# Patient Record
Sex: Male | Born: 1960 | Race: Black or African American | Hispanic: No | Marital: Married | Smoking: Never smoker
Health system: Southern US, Community
[De-identification: ages and names within clinical notes are randomized; demographics above are authoritative.]

## PROBLEM LIST (undated history)

## (undated) DIAGNOSIS — U071 COVID-19: Secondary | ICD-10-CM

## (undated) DIAGNOSIS — Z973 Presence of spectacles and contact lenses: Secondary | ICD-10-CM

## (undated) DIAGNOSIS — N201 Calculus of ureter: Secondary | ICD-10-CM

## (undated) DIAGNOSIS — Z8719 Personal history of other diseases of the digestive system: Secondary | ICD-10-CM

## (undated) DIAGNOSIS — K625 Hemorrhage of anus and rectum: Secondary | ICD-10-CM

## (undated) DIAGNOSIS — L509 Urticaria, unspecified: Secondary | ICD-10-CM

## (undated) DIAGNOSIS — N182 Chronic kidney disease, stage 2 (mild): Secondary | ICD-10-CM

## (undated) DIAGNOSIS — N281 Cyst of kidney, acquired: Secondary | ICD-10-CM

## (undated) DIAGNOSIS — R3915 Urgency of urination: Secondary | ICD-10-CM

## (undated) DIAGNOSIS — E291 Testicular hypofunction: Secondary | ICD-10-CM

## (undated) DIAGNOSIS — N5089 Other specified disorders of the male genital organs: Secondary | ICD-10-CM

## (undated) DIAGNOSIS — I2510 Atherosclerotic heart disease of native coronary artery without angina pectoris: Secondary | ICD-10-CM

## (undated) DIAGNOSIS — R0789 Other chest pain: Secondary | ICD-10-CM

## (undated) DIAGNOSIS — N200 Calculus of kidney: Secondary | ICD-10-CM

## (undated) DIAGNOSIS — E785 Hyperlipidemia, unspecified: Secondary | ICD-10-CM

## (undated) DIAGNOSIS — N2889 Other specified disorders of kidney and ureter: Secondary | ICD-10-CM

## (undated) DIAGNOSIS — D751 Secondary polycythemia: Secondary | ICD-10-CM

## (undated) DIAGNOSIS — Z87442 Personal history of urinary calculi: Secondary | ICD-10-CM

## (undated) DIAGNOSIS — I10 Essential (primary) hypertension: Secondary | ICD-10-CM

## (undated) HISTORY — PX: OTHER SURGICAL HISTORY: SHX169

## (undated) HISTORY — DX: Hemorrhage of anus and rectum: K62.5

## (undated) HISTORY — DX: Personal history of other diseases of the digestive system: Z87.19

## (undated) HISTORY — DX: Essential (primary) hypertension: I10

## (undated) HISTORY — DX: Secondary polycythemia: D75.1

## (undated) HISTORY — DX: Chronic kidney disease, stage 2 (mild): N18.2

## (undated) HISTORY — DX: Other specified disorders of kidney and ureter: N28.89

## (undated) HISTORY — DX: Testicular hypofunction: E29.1

## (undated) HISTORY — DX: Atherosclerotic heart disease of native coronary artery without angina pectoris: I25.10

## (undated) HISTORY — DX: Cyst of kidney, acquired: N28.1

## (undated) HISTORY — DX: Urticaria, unspecified: L50.9

## (undated) HISTORY — DX: Calculus of kidney: N20.0

## (undated) HISTORY — DX: Other chest pain: R07.89

## (undated) HISTORY — DX: Hyperlipidemia, unspecified: E78.5

---
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
## 1999-05-02 ENCOUNTER — Emergency Department (HOSPITAL_COMMUNITY): Admission: EM | Admit: 1999-05-02 | Discharge: 1999-05-03 | Payer: Self-pay | Admitting: Emergency Medicine

## 1999-05-03 ENCOUNTER — Encounter: Payer: Self-pay | Admitting: Emergency Medicine

## 1999-05-04 ENCOUNTER — Encounter: Payer: Self-pay | Admitting: Urology

## 1999-05-04 ENCOUNTER — Encounter: Admission: RE | Admit: 1999-05-04 | Discharge: 1999-05-04 | Payer: Self-pay | Admitting: Urology

## 2000-01-15 HISTORY — PX: KNEE SURGERY: SHX244

## 2003-11-15 DIAGNOSIS — N281 Cyst of kidney, acquired: Secondary | ICD-10-CM

## 2003-11-15 HISTORY — DX: Cyst of kidney, acquired: N28.1

## 2003-11-30 ENCOUNTER — Emergency Department (HOSPITAL_COMMUNITY): Admission: EM | Admit: 2003-11-30 | Discharge: 2003-11-30 | Payer: Self-pay | Admitting: Emergency Medicine

## 2006-07-14 ENCOUNTER — Emergency Department (HOSPITAL_COMMUNITY): Admission: EM | Admit: 2006-07-14 | Discharge: 2006-07-14 | Payer: Self-pay | Admitting: Emergency Medicine

## 2010-02-14 DIAGNOSIS — E291 Testicular hypofunction: Secondary | ICD-10-CM

## 2010-02-14 HISTORY — DX: Testicular hypofunction: E29.1

## 2010-05-09 ENCOUNTER — Encounter: Payer: Self-pay | Admitting: Family Medicine

## 2010-05-09 ENCOUNTER — Ambulatory Visit (INDEPENDENT_AMBULATORY_CARE_PROVIDER_SITE_OTHER): Payer: Self-pay | Admitting: Family Medicine

## 2010-05-09 ENCOUNTER — Other Ambulatory Visit: Payer: Self-pay | Admitting: Family Medicine

## 2010-05-09 DIAGNOSIS — E785 Hyperlipidemia, unspecified: Secondary | ICD-10-CM

## 2010-05-09 DIAGNOSIS — R3129 Other microscopic hematuria: Secondary | ICD-10-CM

## 2010-05-09 DIAGNOSIS — E291 Testicular hypofunction: Secondary | ICD-10-CM

## 2010-05-09 DIAGNOSIS — I10 Essential (primary) hypertension: Secondary | ICD-10-CM

## 2010-05-09 DIAGNOSIS — N529 Male erectile dysfunction, unspecified: Secondary | ICD-10-CM

## 2010-05-09 LAB — CBC WITH DIFFERENTIAL/PLATELET
Basophils Relative: 0.2 % (ref 0.0–3.0)
Eosinophils Relative: 2.7 % (ref 0.0–5.0)
HCT: 54.2 % — ABNORMAL HIGH (ref 39.0–52.0)
Hemoglobin: 18 g/dL — ABNORMAL HIGH (ref 13.0–17.0)
Lymphs Abs: 1.4 10*3/uL (ref 0.7–4.0)
Monocytes Relative: 6.7 % (ref 3.0–12.0)
Neutro Abs: 3.5 10*3/uL (ref 1.4–7.7)
Platelets: 172 10*3/uL (ref 150.0–400.0)
RBC: 5.96 Mil/uL — ABNORMAL HIGH (ref 4.22–5.81)
WBC: 5.5 10*3/uL (ref 4.5–10.5)

## 2010-05-09 LAB — TESTOSTERONE: Testosterone: 96.57 ng/dL — ABNORMAL LOW (ref 350.00–890.00)

## 2010-05-09 LAB — POCT URINALYSIS DIPSTICK
Ketones, UA: NEGATIVE
Leukocytes, UA: NEGATIVE

## 2010-05-09 LAB — COMPREHENSIVE METABOLIC PANEL
CO2: 29 mEq/L (ref 19–32)
GFR: 62.05 mL/min (ref >60.00)
Glucose, Bld: 75 mg/dL (ref 70–99)
Sodium: 142 mEq/L (ref 135–145)
Total Bilirubin: 0.5 mg/dL (ref 0.3–1.2)
Total Protein: 7.5 g/dL (ref 6.0–8.3)

## 2010-05-09 MED ORDER — AMLODIPINE BESYLATE 10 MG PO TABS: 10.0000 mg | ORAL_TABLET | Freq: Every day | ORAL | Status: DC

## 2010-05-09 NOTE — Assessment & Plan Note (Signed)
Restart med: I rx'd amlodipine 10mg  qd.  He stated that it took " a really low dose" of prior bp med (he can't recall name) to bring bp into normal range. I encouraged him to monitor bp daily until I see him for f/u in 1 wk. BP goal <130/80.  Will recheck Cr today.  He could have some mild renal insuff from his HTN, but given his large/muscular build this Cr level could still reflect normal renal fxn.

## 2010-05-09 NOTE — Progress Notes (Signed)
Office Note 05/09/2010  CC:  Chief Complaint  Patient presents with  . Establish Care    new patient    HPI:  Patrick Campbell is a 50 y.o. Black male who is here to establish care and discuss HTN, hypogonadism, recent mildly elevated Cr on his employers' labs. Patient's most recent primary MD: Dr. Tomma Rakers [phone]).  He saw him once a couple of months ago. Old records were reviewed prior to or during today's visit: his labs from 04/30/10 (normal TSH, PSA, lipids, HbA1c, and CMET except Cr 1.41 (compared to 1.35 per his report < 1 yr ago).  Hb 18.1. These labs were done after he was on testosterone gel for 1-2 mo --pt doesn't know name of product or his dosing, "it was a tube".  He had stopped the gel a few days prior to his labs.  He has not restarted it b/c the level as borderline high: 1033 ng/dl (uln is 6644). He felt MUCH improved on the testosterone--energy level back to normal. Has known dx of HTN for at least the last 1 yr, briefly took med and was controlled and then he stopped it b/c he doesn't like taking meds.  Feels worse when bp high (headaches).  He now realizes he needs to stay on certain meds chronically. He's been taking his statin for hypercholesterolemia regularly.  Past Medical History  Diagnosis Date  . Nephrolithiasis 04/1999 & 11/2003    w/right hydronephrosis on u/s 04/1999 & on CT 11/2003  . Acquired renal cyst of left kidney 11/2003    Complicated cyst vs solid mass: contrast enhanced CT was recommended but ?apparently not done?  . Cholelithiasis 11/2003    Noted on noncontrast CT abd.  No cholecystitis.  . Hypogonadism male 02/2010    Past Surgical History  Procedure Date  . Knee surgery 2002    Family History  Problem Relation Age of Onset  . Hypertension Mother   . Cancer Father     lung/ smoker  . Kidney disease Brother   . HIV Brother     History   Social History  . Marital Status: Married    Spouse Name: N/A    Number of  Children: N/A  . Years of Education: N/A   Occupational History  . Not on file.   Social History Main Topics  . Smoking status: Never Smoker   . Smokeless tobacco: Never Used  . Alcohol Use: Yes     2 glasses of wine 2 days a week  . Drug Use: No  . Sexually Active: Yes -- Male partner(s)   Other Topics Concern  . Not on file   Social History Narrative   Married, 3 children (15, 20, 21 yrs).  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  Was in [employer] x 10 yrs in [place], [place].Works out and plays sports regularly.  Rare wine intake.  No cigs or drugs.    Current outpatient prescriptions:atorvastatin (LIPITOR) 40 MG tablet, Take 40 mg by mouth daily.  , Disp: , Rfl: ;  NON FORMULARY, Testosterone gel 20% , Disp: , Rfl: ;  tadalafil (CIALIS) 10 MG tablet, Take 10 mg by mouth daily as needed.  , Disp: , Rfl: ;  amLODipine (NORVASC) 10 MG tablet, Take 1 tablet (10 mg total) by mouth daily., Disp: 30 tablet, Rfl: 0 NOTE: amlodipine was started at today's visit (it is not a med that he was taking prior to today).  No Known Allergies  ROS Review of  Systems  Constitutional: Negative for fever and fatigue.  HENT: Negative for congestion and sore throat.   Eyes: Negative for visual disturbance.  Respiratory: Negative for cough.   Cardiovascular: Negative for chest pain.  Gastrointestinal: Negative for nausea and abdominal pain.  Genitourinary: Negative for dysuria.  Musculoskeletal: Negative for back pain and joint swelling.  Skin: Negative for rash.  Neurological: Negative for weakness and headaches.  Hematological: Negative for adenopathy.    PE; Blood pressure 155/95, pulse 65, temperature 97.5 F (36.4 C), temperature source Oral, height 5\' 11"  (1.803 m), weight 219 lb 1.9 oz (99.392 kg), SpO2 98.00%. Gen: Alert, well appearing.  Patient is oriented to person, place, time, and situation. HEENT: Scalp without lesions or hair loss.  Ears: EACs clear, normal epithelium.   TMs with good light reflex and landmarks bilaterally.  Eyes: no injection, icteris, swelling, or exudate.  EOMI, PERRLA. Nose: no drainage or turbinate edema/swelling.  No injection or focal lesion.  Mouth: lips without lesion/swelling.  Oral mucosa pink and moist.  Dentition intact and without obvious caries or gingival swelling.  Oropharynx without erythema, exudate, or swelling.  Neck: supple, ROM full.  Carotids 2+ bilat, without bruit.  No lymphadenopathy, thyromegaly, or mass. Chest: symmetric expansion, nonlabored respirations.  Clear and equal breath sounds in all lung fields.   CV: RRR, no m/r/g.  Peripheral pulses 2+ and symmetric. ABD: soft, NT, ND, BS normal.  No hepatospenomegaly or mass.  No bruits. EXT: no clubbing, cyanosis, or edema.   LABS: CC UA today: small hemolyzed blood, otherwise normal. 12 lead EKG today: sinus brady, rate 54, normal wave morphologies, intervals, voltages, and axis.  ASSESSMENT AND PLAN:   HTN (hypertension), benign Restart med: I rx'd amlodipine 10mg  qd.  He stated that it took " a really low dose" of prior bp med (he can't recall name) to bring bp into normal range. I encouraged him to monitor bp daily until I see him for f/u in 1 wk. BP goal <130/80.  Will recheck Cr today.  He could have some mild renal insuff from his HTN, but given his large/muscular build this Cr level could still reflect normal renal fxn.   Hypogonadism male Recheck testost level today since off med x about 2 wks.  Need old records/original low test test.  Will do TSH and prolactin today as well. Will likely restart testost gel in near future.     Return in about 7 days (around 05/16/2010).

## 2010-05-09 NOTE — Assessment & Plan Note (Signed)
Recheck testost level today since off med x about 2 wks.  Need old records/original low test test.  Will do TSH and prolactin today as well. Will likely restart testost gel in near future.

## 2010-05-09 NOTE — Patient Instructions (Signed)
Sign release of information authorization sheet when you check out (need old records from Dr. Tomma Rakers [id number]). Check bp at home once daily with an UPPER ARM bp cuff and write them down and bring in with you at next follow up in 7-10 days.

## 2010-05-10 ENCOUNTER — Encounter: Payer: Self-pay | Admitting: Family Medicine

## 2010-05-10 DIAGNOSIS — D751 Secondary polycythemia: Secondary | ICD-10-CM

## 2010-05-10 HISTORY — DX: Secondary polycythemia: D75.1

## 2010-05-10 LAB — FSH/LH
FSH: 0.7 m[IU]/mL — ABNORMAL LOW (ref 1.4–18.1)
LH: 0.4 m[IU]/mL — ABNORMAL LOW (ref 1.5–9.3)

## 2010-05-11 LAB — ERYTHROPOIETIN: Erythropoietin: 5.5 m[IU]/mL (ref 2.6–34.0)

## 2010-05-15 ENCOUNTER — Other Ambulatory Visit: Payer: Self-pay | Admitting: Family Medicine

## 2010-05-15 DIAGNOSIS — D751 Secondary polycythemia: Secondary | ICD-10-CM

## 2010-05-16 ENCOUNTER — Other Ambulatory Visit (INDEPENDENT_AMBULATORY_CARE_PROVIDER_SITE_OTHER): Payer: PRIVATE HEALTH INSURANCE

## 2010-05-16 DIAGNOSIS — D751 Secondary polycythemia: Secondary | ICD-10-CM

## 2010-05-17 ENCOUNTER — Encounter: Payer: Self-pay | Admitting: Family Medicine

## 2010-05-21 ENCOUNTER — Telehealth: Payer: Self-pay | Admitting: Family Medicine

## 2010-05-21 ENCOUNTER — Ambulatory Visit: Payer: PRIVATE HEALTH INSURANCE | Admitting: Family Medicine

## 2010-05-21 NOTE — Telephone Encounter (Signed)
Pls call patient and let him know that his recent iron tests were all normal.   Please have him reschedule the f/u appt that he missed today so we can recheck bp issue and discuss the next step regarding his low testosterone level.

## 2010-05-23 NOTE — Telephone Encounter (Signed)
I have attempted to contact this patient by phone with the following results: left message to return my call on answering machine.

## 2010-05-25 NOTE — Telephone Encounter (Signed)
I have attempted to contact this patient by phone with the following results: left message to return my call on answering machine (home/cell).

## 2010-05-28 NOTE — Telephone Encounter (Signed)
Spoke to pt. Advised iron labs normal.  Pt rescheduled appt for 06/06/10 @ 8:30am.  Advised pt we would discuss labs and recheck BP at this visit.

## 2010-06-06 ENCOUNTER — Ambulatory Visit: Payer: PRIVATE HEALTH INSURANCE | Admitting: Family Medicine

## 2010-06-12 ENCOUNTER — Encounter: Payer: Self-pay | Admitting: Family Medicine

## 2010-06-12 ENCOUNTER — Ambulatory Visit (INDEPENDENT_AMBULATORY_CARE_PROVIDER_SITE_OTHER): Payer: PRIVATE HEALTH INSURANCE | Admitting: Family Medicine

## 2010-06-12 VITALS — BP 149/90 | HR 73 | Ht 71.0 in | Wt 212.0 lb

## 2010-06-12 DIAGNOSIS — I10 Essential (primary) hypertension: Secondary | ICD-10-CM

## 2010-06-12 DIAGNOSIS — R51 Headache: Secondary | ICD-10-CM

## 2010-06-12 DIAGNOSIS — N189 Chronic kidney disease, unspecified: Secondary | ICD-10-CM | POA: Insufficient documentation

## 2010-06-12 DIAGNOSIS — G8929 Other chronic pain: Secondary | ICD-10-CM

## 2010-06-12 DIAGNOSIS — H538 Other visual disturbances: Secondary | ICD-10-CM

## 2010-06-12 DIAGNOSIS — D751 Secondary polycythemia: Secondary | ICD-10-CM

## 2010-06-12 DIAGNOSIS — E291 Testicular hypofunction: Secondary | ICD-10-CM

## 2010-06-12 MED ORDER — TESTOSTERONE 25 MG/2.5GM (1%) TD GEL: 5.0000 g | Freq: Every day | TRANSDERMAL | Status: DC

## 2010-06-12 NOTE — Progress Notes (Signed)
OFFICE VISIT  06/12/2010   CC:  Chief Complaint  Patient presents with  . Hypertension    only taking 1/2 tab amlodipine     HPI:    Patient is a 50 y.o. African-American male who presents for f/u hypogonadism, mild CRI, HTN. Taking 1/2 the amolidipine tab instead of whole: no clear reason why.  Not monitoring bp at home. We reviewed all lab work done about 75mo ago and discussed the possible next step, including possible MRI brain, repeat LH/FSH to see if recent low levels simply reflected a delayed rebound from suppression when he was on testosterone therapy, and possible referral to endocrinologist. Then he revealed that he has been having almost daily frontal/temporal headaches with vague blurry vision over about the last year or so.  No visual field loss, no unilateral symptoms.  No nausea, aura, or photophobia w/HAs.  Aspirin helps the HAs.   Past Medical History  Diagnosis Date  . Nephrolithiasis 04/1999 & 11/2003    w/right hydronephrosis on u/s 04/1999 & on CT 11/2003  . Acquired renal cyst of left kidney 11/2003    Complicated cyst vs solid mass: contrast enhanced CT was recommended but ?apparently not done?  . Cholelithiasis 11/2003    Noted on noncontrast CT abd.  No cholecystitis.  . Hypogonadism male 02/2010    Past Surgical History  Procedure Date  . Knee surgery 2002    Outpatient Prescriptions Prior to Visit  Medication Sig Dispense Refill  . amLODipine (NORVASC) 10 MG tablet Take 1 tablet (10 mg total) by mouth daily.  30 tablet  0  . atorvastatin (LIPITOR) 40 MG tablet Take 40 mg by mouth daily.        . tadalafil (CIALIS) 10 MG tablet Take 10 mg by mouth daily as needed.        . NON FORMULARY Testosterone gel 20%         No Known Allergies  ROS As per HPI.    PE: Blood pressure 149/90, pulse 73, height 5\' 11"  (1.803 m), weight 212 lb (96.163 kg). Gen: Alert, well appearing.  Patient is oriented to person, place, time, and situation. No further exam  today.  LABS:  None today  IMPRESSION AND PLAN:  HTN (hypertension), benign Not ideally controlled. Encouraged compliance with 1 full tab of amlodipine 10mg  daily. Encouraged occasional home monitoring.  Hypogonadism male Lab Results  Component Value Date   TESTOSTERONE 96.57* 05/09/2010   Restart testost: Androgel 5g packet, one qd topically. Will order brain MRI w and w/o contrast with fine cuts in pituitary region to further assess for pituitary adenoma (non-secreting type) being possible cause of his hypogonadism (FSH and LH low, chronic HAs and blurry vision intermittently --coincident with dx of hypogonadism).   Polycythemia Idiopathic. His epo level is normal, and his iron levels are normal.   Hb has been stable.  Chronic headaches Likely chronic tension HA's, but with hypogonadism and blurry vision will r/o pituitary adenoma as etiology.  Chronic renal insufficiency Lab Results  Component Value Date   CREATININE 1.5 05/09/2010  This is stable compared to old records and is only a recent dx (2011). Assumed secondary to chronic uncontrolled htn. Will do 24h urine to get more accurate estimation of CrCl/GFR.      FOLLOW UP: Return in about 2 months (around 08/12/2010) for f/u hypogonadism, HA's, HTN.

## 2010-06-12 NOTE — Assessment & Plan Note (Signed)
Likely chronic tension HA's, but with hypogonadism and blurry vision will r/o pituitary adenoma as etiology.

## 2010-06-12 NOTE — Assessment & Plan Note (Signed)
Lab Results  Component Value Date   CREATININE 1.5 05/09/2010  This is stable compared to old records and is only a recent dx (2011). Assumed secondary to chronic uncontrolled htn. Will do 24h urine to get more accurate estimation of CrCl/GFR.

## 2010-06-12 NOTE — Assessment & Plan Note (Signed)
Not ideally controlled. Encouraged compliance with 1 full tab of amlodipine 10mg  daily. Encouraged occasional home monitoring.

## 2010-06-12 NOTE — Assessment & Plan Note (Signed)
Idiopathic. His epo level is normal, and his iron levels are normal.   Hb has been stable.

## 2010-06-12 NOTE — Assessment & Plan Note (Signed)
Lab Results  Component Value Date   TESTOSTERONE 96.57* 05/09/2010   Restart testost: Androgel 5g packet, one qd topically. Will order brain MRI w and w/o contrast with fine cuts in pituitary region to further assess for pituitary adenoma (non-secreting type) being possible cause of his hypogonadism (FSH and LH low, chronic HAs and blurry vision intermittently --coincident with dx of hypogonadism).

## 2010-06-15 ENCOUNTER — Other Ambulatory Visit: Payer: Self-pay

## 2010-06-15 MED ORDER — AMLODIPINE BESYLATE 10 MG PO TABS: 10.0000 mg | ORAL_TABLET | Freq: Every day | ORAL | Status: DC

## 2010-06-28 ENCOUNTER — Telehealth: Payer: Self-pay | Admitting: Family Medicine

## 2010-06-28 NOTE — Telephone Encounter (Signed)
Patient put in a request with the pharmacy yesterday, did we receive the request? Patient is going out of town at noon today, please contact patient's wife

## 2010-06-28 NOTE — Telephone Encounter (Signed)
RX was sent to pharm on 06/27/10.  Pt spoke with Erie Noe and patient will check with pharmacy to see if they have RX.

## 2010-10-11 ENCOUNTER — Other Ambulatory Visit: Payer: Self-pay

## 2010-10-11 MED ORDER — AMLODIPINE BESYLATE 10 MG PO TABS: 10.0000 mg | ORAL_TABLET | Freq: Every day | ORAL | Status: DC

## 2010-10-31 LAB — POCT I-STAT CREATININE
Creatinine, Ser: 1.3
Operator id: 151321

## 2010-10-31 LAB — I-STAT 8, (EC8 V) (CONVERTED LAB)
Acid-base deficit: 2
BUN: 16
Bicarbonate: 25.3 — ABNORMAL HIGH
Chloride: 108
Glucose, Bld: 97
HCT: 45
Hemoglobin: 15.3
Operator id: 151321
Potassium: 4
Sodium: 139
TCO2: 27
pCO2, Ven: 49.3
pH, Ven: 7.318 — ABNORMAL HIGH

## 2010-10-31 LAB — POCT CARDIAC MARKERS
CKMB, poc: 1.4
Myoglobin, poc: 117
Operator id: 151321
Troponin i, poc: <0.05

## 2011-07-09 ENCOUNTER — Ambulatory Visit: Payer: PRIVATE HEALTH INSURANCE | Admitting: Family Medicine

## 2011-07-15 ENCOUNTER — Encounter: Payer: Self-pay | Admitting: Family Medicine

## 2011-07-15 ENCOUNTER — Ambulatory Visit (INDEPENDENT_AMBULATORY_CARE_PROVIDER_SITE_OTHER): Payer: PRIVATE HEALTH INSURANCE | Admitting: Family Medicine

## 2011-07-15 ENCOUNTER — Encounter: Payer: Self-pay | Admitting: Internal Medicine

## 2011-07-15 VITALS — BP 159/100 | HR 71 | Temp 97.1°F | Ht 71.0 in | Wt 216.0 lb

## 2011-07-15 DIAGNOSIS — R51 Headache: Secondary | ICD-10-CM

## 2011-07-15 DIAGNOSIS — E785 Hyperlipidemia, unspecified: Secondary | ICD-10-CM

## 2011-07-15 DIAGNOSIS — I10 Essential (primary) hypertension: Secondary | ICD-10-CM

## 2011-07-15 DIAGNOSIS — Z1211 Encounter for screening for malignant neoplasm of colon: Secondary | ICD-10-CM

## 2011-07-15 DIAGNOSIS — D45 Polycythemia vera: Secondary | ICD-10-CM

## 2011-07-15 DIAGNOSIS — D751 Secondary polycythemia: Secondary | ICD-10-CM

## 2011-07-15 DIAGNOSIS — H538 Other visual disturbances: Secondary | ICD-10-CM

## 2011-07-15 DIAGNOSIS — E291 Testicular hypofunction: Secondary | ICD-10-CM

## 2011-07-15 DIAGNOSIS — Z0000 Encounter for general adult medical examination without abnormal findings: Secondary | ICD-10-CM | POA: Insufficient documentation

## 2011-07-15 DIAGNOSIS — Z125 Encounter for screening for malignant neoplasm of prostate: Secondary | ICD-10-CM

## 2011-07-15 DIAGNOSIS — G8929 Other chronic pain: Secondary | ICD-10-CM

## 2011-07-15 MED ORDER — AMLODIPINE BESYLATE 10 MG PO TABS: 10.0000 mg | ORAL_TABLET | Freq: Every day | ORAL | Status: DC

## 2011-07-15 MED ORDER — TESTOSTERONE 10 MG/ACT (2%) TD GEL: 40.0000 mg | Freq: Every day | TRANSDERMAL | Status: DC

## 2011-07-15 MED ORDER — ATORVASTATIN CALCIUM 40 MG PO TABS: 40.0000 mg | ORAL_TABLET | Freq: Every day | ORAL | Status: DC

## 2011-07-15 NOTE — Assessment & Plan Note (Signed)
Recheck this ASAP when he comes in for fasting labs. Azucena Freed was listed as preferred according to the insurance info in our EMR, so I changed him to this med today, 2 quirts of gel on each thigh once daily. We'll recheck level at f/u in 1 mo.

## 2011-07-15 NOTE — Assessment & Plan Note (Signed)
Poor control, medication noncompliance. RF'd med. Check lytes/cr.

## 2011-07-15 NOTE — Assessment & Plan Note (Signed)
Check FLP ASAP--he'll return for fasting lab visit--orders placed.

## 2011-07-15 NOTE — Assessment & Plan Note (Addendum)
Reviewed age and gender appropriate health maintenance issues (prudent diet, regular exercise, health risks of tobacco and excessive alcohol, use of seatbelts, fire alarms in home, use of sunscreen).  Also reviewed age and gender appropriate health screening as well as vaccine recommendations. PSA screening for prostate cancer ordered--pt to return for labs when fasting). Referral to GI for colon cancer screening ordered today. Need to give him Tdap booster at f/u in 1 month.

## 2011-07-15 NOTE — Assessment & Plan Note (Signed)
EPO level in the past normal. Will follow CBC.

## 2011-07-15 NOTE — Assessment & Plan Note (Signed)
These have improved, are more likely tension HA's + poorly controlled HTN. He describes vision "blurry" but this goes away with corrective lenses.   However, with hx of very low testosterone, low LH and FSH, and hx of these HAs and vision complaints, I do think it is appropriate to do brain MRI w and w/o contrast to further evaluate pituitary gland.

## 2011-07-15 NOTE — Progress Notes (Signed)
Office Note 07/15/2011  CC:  Chief Complaint  Patient presents with  . Annual Exam    refill BP meds, 90 day    HPI:  Patrick Campbell is a 51 y.o. Black male who is here for CPE. I last saw him over a year ago.  For some reason, his MRI brain that we ordered to look at his pituitary gland was cancelled.  He says he never got a call about it. He reports he has been out of his BP med for "a while"--months. Also not on testosterone gel for 6 mo or so, says he thinks he was feeling better on it but then questions this. He reiterates the stress of his job, feels "the weight of the world" on his shoulders.   Exercises a couple of times per week, otherwise no stress outlet. Says HA's have not been as big a problem lately as they were last f/u visit. Still has "blurry vision" but then says this is corrected when wearing corrective lenses. His androgel was "discontinued" per a letter he received around 6 mo ago, unclear info on this.   Past Medical History  Diagnosis Date  . Nephrolithiasis 04/1999 & 11/2003    w/right hydronephrosis on u/s 04/1999 & on CT 11/2003  . Acquired renal cyst of left kidney 11/2003    Complicated cyst vs solid mass: contrast enhanced CT was recommended but ?apparently not done?  . Cholelithiasis 11/2003    Noted on noncontrast CT abd.  No cholecystitis.  . Hypogonadism male 02/2010  . Hypertension   . Chronic renal insufficiency, stage II (mild)     Borderline stage II/III (CrCl estimate @ low 60s)  . Polycythemia 05/10/2010    EPO level normal    Past Surgical History  Procedure Date  . Knee surgery 2002    Family History  Problem Relation Age of Onset  . Hypertension Mother   . Cancer Father     lung/ smoker  . Kidney disease Brother   . HIV Brother     History   Social History  . Marital Status: Married    Spouse Name: N/A    Number of Children: N/A  . Years of Education: N/A   Occupational History  . Not on file.   Social History  Main Topics  . Smoking status: Never Smoker   . Smokeless tobacco: Never Used  . Alcohol Use: Yes     2 glasses of wine 2 days a week  . Drug Use: No  . Sexually Active: Yes -- Male partner(s)   Other Topics Concern  . Not on file   Social History Narrative   Married, 3 children (15, 20, 21 yrs).  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  Rare wine intake.  No cigs or drugs.    Outpatient Prescriptions Prior to Visit  Medication Sig Dispense Refill  . atorvastatin (LIPITOR) 40 MG tablet Take 40 mg by mouth daily.        Marland Kitchen amLODipine (NORVASC) 10 MG tablet Take 1 tablet (10 mg total) by mouth daily.  30 tablet  2  . tadalafil (CIALIS) 10 MG tablet Take 10 mg by mouth daily as needed.        . Testosterone (ANDROGEL) 25 MG/2.5GM GEL Place 5 g onto the skin daily.  2.5 g  5    No Known Allergies  ROS Review of Systems  Constitutional:  Negative for fever, chills, appetite change and fatigue.  HENT: Negative for ear pain, congestion, sore throat, neck stiffness and dental problem.   Eyes: Negative for discharge, redness and visual disturbance.  Respiratory: Negative for cough, chest tightness, shortness of breath and wheezing.   Cardiovascular: Negative for chest pain, palpitations and leg swelling.  Gastrointestinal: Negative for nausea, vomiting, abdominal pain, diarrhea and blood in stool.  Genitourinary: Negative for dysuria, urgency, frequency, hematuria, flank pain and difficulty urinating.  Musculoskeletal: Negative for myalgias, back pain, joint swelling and arthralgias.  Skin: Negative for pallor and rash.  Neurological: Positive for headaches. Negative for dizziness, speech difficulty and weakness.  Hematological: Negative for adenopathy. Does not bruise/bleed easily.  Psychiatric/Behavioral: Negative for confusion and disturbed wake/sleep cycle. The patient is not nervous/anxious.      PE; Blood pressure 159/100, pulse 71, temperature 97.1 F (36.2 C), temperature source Temporal, height 5\' 11"  (1.803 m), weight 216 lb (97.977 kg). Gen: Alert, well appearing AA male.  Patient is oriented to person, place, time, and situation. Affect is pleasant, thought and speech lucid. ENT: Ears: EACs clear, normal epithelium.  TMs with good light reflex and landmarks bilaterally.  Eyes: no injection, icteris, swelling, or exudate.  EOMI, PERRLA. Nose: no drainage or turbinate edema/swelling.  No injection or focal lesion.  Mouth: lips without lesion/swelling.  Oral mucosa pink and moist.  Dentition intact and without obvious caries or gingival swelling.  Oropharynx without erythema, exudate, or swelling.  Neck: supple/nontender.  No LAD, mass, or TM.  Carotid pulses 2+ bilaterally, without bruits. CV: RRR, no m/r/g.   LUNGS: CTA bilat, nonlabored resps, good aeration in all lung fields. ABD: soft, NT, ND, BS normal.  No hepatospenomegaly or mass.  No bruits. EXT: no clubbing, cyanosis, or edema.  Genitals normal; both testes normal without tenderness, masses, hydroceles, varicoceles, erythema or swelling. Shaft normal, circumcised, meatus normal without discharge. No inguinal hernia noted. No inguinal lymphadenopathy. Rectal exam: negative without mass, lesions or tenderness, PROSTATE EXAM: smooth and symmetric without nodules or tenderness.   Pertinent labs:  None today  ASSESSMENT AND PLAN:   Health maintenance examination Reviewed age and gender appropriate health maintenance issues (prudent diet, regular exercise, health risks of tobacco and excessive alcohol, use of seatbelts, fire alarms in home, use of sunscreen).  Also reviewed age and gender appropriate health screening as well as vaccine recommendations. PSA screening for prostate cancer ordered--pt to return for labs when fasting). Referral to GI for colon cancer screening ordered today. Need to give him Tdap booster at  f/u in 1 month.   HTN (hypertension), benign Poor control, medication noncompliance. RF'd med. Check lytes/cr.  Hyperlipidemia Check FLP ASAP--he'll return for fasting lab visit--orders placed.  Polycythemia EPO level in the past normal. Will follow CBC.  Chronic headaches These have improved, are more likely tension HA's + poorly controlled HTN. He describes vision "blurry" but this goes away with corrective lenses.   However, with hx of very low testosterone, low LH and FSH, and hx of these HAs and vision complaints, I do think it is appropriate to do brain MRI w and w/o contrast to further evaluate pituitary gland.  Hypogonadism male Recheck this ASAP when he comes in for fasting labs. Azucena Freed was listed as preferred according to the insurance info in our EMR, so I changed him to this med today, 2 quirts of gel on each thigh once daily. We'll recheck level at f/u in 1 mo.    FOLLOW UP:  Return in about 1 month (around 08/15/2011) for f/u HTN and hypogonadism.

## 2011-07-16 ENCOUNTER — Other Ambulatory Visit: Payer: PRIVATE HEALTH INSURANCE

## 2011-07-20 ENCOUNTER — Ambulatory Visit (HOSPITAL_BASED_OUTPATIENT_CLINIC_OR_DEPARTMENT_OTHER): Payer: PRIVATE HEALTH INSURANCE

## 2011-07-22 ENCOUNTER — Other Ambulatory Visit (INDEPENDENT_AMBULATORY_CARE_PROVIDER_SITE_OTHER): Payer: PRIVATE HEALTH INSURANCE

## 2011-07-22 DIAGNOSIS — Z1211 Encounter for screening for malignant neoplasm of colon: Secondary | ICD-10-CM

## 2011-07-22 DIAGNOSIS — D751 Secondary polycythemia: Secondary | ICD-10-CM

## 2011-07-22 DIAGNOSIS — E785 Hyperlipidemia, unspecified: Secondary | ICD-10-CM

## 2011-07-22 DIAGNOSIS — Z125 Encounter for screening for malignant neoplasm of prostate: Secondary | ICD-10-CM

## 2011-07-22 DIAGNOSIS — H538 Other visual disturbances: Secondary | ICD-10-CM

## 2011-07-22 DIAGNOSIS — E291 Testicular hypofunction: Secondary | ICD-10-CM

## 2011-07-22 DIAGNOSIS — R51 Headache: Secondary | ICD-10-CM

## 2011-07-22 DIAGNOSIS — D45 Polycythemia vera: Secondary | ICD-10-CM

## 2011-07-22 DIAGNOSIS — I10 Essential (primary) hypertension: Secondary | ICD-10-CM

## 2011-07-22 DIAGNOSIS — Z0000 Encounter for general adult medical examination without abnormal findings: Secondary | ICD-10-CM

## 2011-07-22 LAB — LUTEINIZING HORMONE: LH: 3.28 m[IU]/mL (ref 1.50–9.30)

## 2011-07-22 LAB — COMPREHENSIVE METABOLIC PANEL
ALT: 29 U/L (ref 0–53)
AST: 27 U/L (ref 0–37)
Albumin: 4.1 g/dL (ref 3.5–5.2)
Alkaline Phosphatase: 45 U/L (ref 39–117)
Calcium: 9.2 mg/dL (ref 8.4–10.5)
Chloride: 106 mEq/L (ref 96–112)
Creatinine, Ser: 1.4 mg/dL (ref 0.4–1.5)
Potassium: 4.1 mEq/L (ref 3.5–5.1)

## 2011-07-22 LAB — CBC WITH DIFFERENTIAL/PLATELET
Basophils Relative: 0.1 % (ref 0.0–3.0)
Eosinophils Absolute: 0.3 10*3/uL (ref 0.0–0.7)
HCT: 47.5 % (ref 39.0–52.0)
Hemoglobin: 15.6 g/dL (ref 13.0–17.0)
MCHC: 32.7 g/dL (ref 30.0–36.0)
MCV: 91.9 fl (ref 78.0–100.0)
Monocytes Absolute: 0.5 10*3/uL (ref 0.1–1.0)
Neutro Abs: 3.7 10*3/uL (ref 1.4–7.7)
RBC: 5.17 Mil/uL (ref 4.22–5.81)

## 2011-07-22 LAB — LIPID PANEL
LDL Cholesterol: 111 mg/dL — ABNORMAL HIGH (ref 0–99)
Total CHOL/HDL Ratio: 4

## 2011-07-22 LAB — PROLACTIN: Prolactin: 6.6 ng/mL (ref 2.1–17.1)

## 2011-07-27 ENCOUNTER — Ambulatory Visit (HOSPITAL_BASED_OUTPATIENT_CLINIC_OR_DEPARTMENT_OTHER)
Admission: RE | Admit: 2011-07-27 | Discharge: 2011-07-27 | Disposition: A | Discharge status: Home / Self Care | Payer: PRIVATE HEALTH INSURANCE | Source: Ambulatory Visit | Attending: Family Medicine | Admitting: Family Medicine

## 2011-07-27 DIAGNOSIS — R51 Headache: Secondary | ICD-10-CM

## 2011-07-27 DIAGNOSIS — H538 Other visual disturbances: Secondary | ICD-10-CM

## 2011-07-27 DIAGNOSIS — E291 Testicular hypofunction: Secondary | ICD-10-CM | POA: Insufficient documentation

## 2011-07-27 MED ORDER — GADOBENATE DIMEGLUMINE 529 MG/ML IV SOLN
10.0000 mL | Freq: Once | INTRAVENOUS | Status: AC | PRN
  Administered 2011-07-27: 10 mL via INTRAVENOUS

## 2011-08-19 ENCOUNTER — Encounter: Payer: PRIVATE HEALTH INSURANCE | Admitting: Family Medicine

## 2011-08-19 DIAGNOSIS — Z0289 Encounter for other administrative examinations: Secondary | ICD-10-CM

## 2011-08-23 ENCOUNTER — Encounter: Payer: Self-pay | Admitting: *Deleted

## 2011-08-29 ENCOUNTER — Telehealth: Payer: Self-pay

## 2011-08-29 NOTE — Telephone Encounter (Signed)
Yes patient was back on the testosterone when the labs were drawn.

## 2011-08-29 NOTE — Telephone Encounter (Signed)
Patient left a message that he was returning Stephanie's call on his blood work?  I called pt and left a message for him to return my call.

## 2011-09-03 ENCOUNTER — Encounter: Payer: PRIVATE HEALTH INSURANCE | Admitting: Internal Medicine

## 2011-09-17 ENCOUNTER — Encounter: Payer: Self-pay | Admitting: Internal Medicine

## 2011-09-17 ENCOUNTER — Ambulatory Visit (AMBULATORY_SURGERY_CENTER): Payer: PRIVATE HEALTH INSURANCE

## 2011-09-17 VITALS — Ht 71.0 in | Wt 217.1 lb

## 2011-09-17 DIAGNOSIS — Z1211 Encounter for screening for malignant neoplasm of colon: Secondary | ICD-10-CM

## 2011-09-17 DIAGNOSIS — Z8371 Family history of colonic polyps: Secondary | ICD-10-CM

## 2011-09-17 MED ORDER — NA SULFATE-K SULFATE-MG SULF 17.5-3.13-1.6 GM/177ML PO SOLN: 1.0000 | Freq: Once | ORAL | Status: DC

## 2011-09-30 ENCOUNTER — Encounter: Payer: Self-pay | Admitting: Internal Medicine

## 2011-09-30 ENCOUNTER — Ambulatory Visit (AMBULATORY_SURGERY_CENTER): Payer: PRIVATE HEALTH INSURANCE | Admitting: Internal Medicine

## 2011-09-30 VITALS — BP 139/91 | HR 93 | Temp 97.5°F | Resp 16 | Ht 71.0 in | Wt 217.0 lb

## 2011-09-30 DIAGNOSIS — Z1211 Encounter for screening for malignant neoplasm of colon: Secondary | ICD-10-CM

## 2011-09-30 DIAGNOSIS — Z8371 Family history of colonic polyps: Secondary | ICD-10-CM

## 2011-09-30 HISTORY — PX: COLONOSCOPY: SHX174

## 2011-09-30 MED ORDER — SODIUM CHLORIDE 0.9 % IV SOLN: 500.0000 mL | INTRAVENOUS | Status: DC

## 2011-09-30 NOTE — Progress Notes (Signed)
Patient did not experience any of the following events: a burn prior to discharge; a fall within the facility; wrong site/side/patient/procedure/implant event; or a hospital transfer or hospital admission upon discharge from the facility. (G8907) Patient did not have preoperative order for IV antibiotic SSI prophylaxis. (G8918)  

## 2011-09-30 NOTE — Progress Notes (Signed)
Propofol given and oxygen managed per [facility] CRNA  When propofol initially given, sats dropped to 83%  O2 up to 6/liters, chin lifted.  Sats remain at 96% now

## 2011-09-30 NOTE — Patient Instructions (Signed)
Normal colonoscopy today. Repeat colonoscopy in 10 years. Call us with any questions or concerns. Thank you!!  YOU HAD AN ENDOSCOPIC PROCEDURE TODAY AT THE [facility]: Refer to the procedure report that was given to you for any specific questions about what was found during the examination.  If the procedure report does not answer your questions, please call your gastroenterologist to clarify.  If you requested that your care partner not be given the details of your procedure findings, then the procedure report has been included in a sealed envelope for you to review at your convenience later.  YOU SHOULD EXPECT: Some feelings of bloating in the abdomen. Passage of more gas than usual.  Walking can help get rid of the air that was put into your GI tract during the procedure and reduce the bloating. If you had a lower endoscopy (such as a colonoscopy or flexible sigmoidoscopy) you may notice spotting of blood in your stool or on the toilet paper. If you underwent a bowel prep for your procedure, then you may not have a normal bowel movement for a few days.  DIET: Your first meal following the procedure should be a light meal and then it is ok to progress to your normal diet.  A half-sandwich or bowl of soup is an example of a good first meal.  Heavy or fried foods are harder to digest and may make you feel nauseous or bloated.  Likewise meals heavy in dairy and vegetables can cause extra gas to form and this can also increase the bloating.  Drink plenty of fluids but you should avoid alcoholic beverages for 24 hours.  ACTIVITY: Your care partner should take you home directly after the procedure.  You should plan to take it easy, moving slowly for the rest of the day.  You can resume normal activity the day after the procedure however you should NOT DRIVE or use heavy machinery for 24 hours (because of the sedation medicines used during the test).    SYMPTOMS TO REPORT IMMEDIATELY: A  gastroenterologist can be reached at any hour.  During normal business hours, 8:30 AM to 5:00 PM Monday through Friday, call [phone].  After hours and on weekends, please call the GI answering service at [phone] who will take a message and have the physician on call contact you.   Following lower endoscopy (colonoscopy or flexible sigmoidoscopy):  Excessive amounts of blood in the stool  Significant tenderness or worsening of abdominal pains  Swelling of the abdomen that is new, acute  Fever of 100F or higher  FOLLOW UP: If any biopsies were taken you will be contacted by phone or by letter within the next 1-3 weeks.  Call your gastroenterologist if you have not heard about the biopsies in 3 weeks.  Our staff will call the home number listed on your records the next business day following your procedure to check on you and address any questions or concerns that you may have at that time regarding the information given to you following your procedure. This is a courtesy call and so if there is no answer at the home number and we have not heard from you through the emergency physician on call, we will assume that you have returned to your regular daily activities without incident.  SIGNATURES/CONFIDENTIALITY: You and/or your care partner have signed paperwork which will be entered into your electronic medical record.  These signatures attest to the fact that that the information above on  your After Visit Summary has been reviewed and is understood.  Full responsibility of the confidentiality of this discharge information lies with you and/or your care-partner.

## 2011-09-30 NOTE — Op Note (Signed)
[facility] [address] [place], [address]   COLONOSCOPY PROCEDURE REPORT  PATIENT: Patrick Campbell, Patrick Campbell  MR#: [id number] BIRTHDATE: 11-Feb-1960 , 51  yrs. old GENDER: Male ENDOSCOPIST: Beverley Fiedler, MD REFERRED VH:QIONGEX, Aneta Mins PROCEDURE DATE:  09/30/2011 PROCEDURE:   Colonoscopy, screening ASA CLASS:   Class II INDICATIONS:average risk screening and first colonoscopy. MEDICATIONS: MAC sedation, administered by CRNA and propofol (Diprivan) 350mg  IV  DESCRIPTION OF PROCEDURE:   After the risks benefits and alternatives of the procedure were thoroughly explained, informed consent was obtained.  A digital rectal exam revealed no rectal mass.   The LB CF-[id number]  endoscope was introduced through the anus and advanced to the terminal ileum which was intubated for a short distance. No adverse events experienced.   The quality of the prep was Suprep good  The instrument was then slowly withdrawn as the colon was fully examined.   COLON FINDINGS: The mucosa appeared normal in the terminal ileum. A normal appearing cecum, ileocecal valve, and appendiceal orifice were identified.  The ascending, hepatic flexure, transverse, splenic flexure, descending, sigmoid colon and rectum appeared unremarkable.  No polyps or cancers were seen.  Retroflexed views revealed no abnormalities. The time to cecum=4 minutes 00 seconds. Withdrawal time=7 minutes 56 seconds.  The scope was withdrawn and the procedure completed.  COMPLICATIONS: There were no complications.  ENDOSCOPIC IMPRESSION: 1.   Normal mucosa in the terminal ileum 2.   Normal colon  RECOMMENDATIONS: You should continue to follow colorectal cancer screening guidelines for "routine risk" patients with a repeat colonoscopy in 10 years. There is no need for FOBT (stool) testing for at least 5 years.   eSigned:  Beverley Fiedler, MD 09/30/2011 1:55 PM   cc: Earley Favor, MD and The Patient

## 2011-10-01 ENCOUNTER — Telehealth: Payer: Self-pay | Admitting: *Deleted

## 2011-10-01 NOTE — Telephone Encounter (Signed)
Left message that called for f/u 

## 2011-10-10 ENCOUNTER — Encounter: Payer: Self-pay | Admitting: Family Medicine

## 2012-02-14 ENCOUNTER — Other Ambulatory Visit: Payer: Self-pay | Admitting: *Deleted

## 2012-02-14 MED ORDER — TADALAFIL 10 MG PO TABS: 10.0000 mg | ORAL_TABLET | Freq: Every day | ORAL | Status: DC | PRN

## 2012-02-14 MED ORDER — TESTOSTERONE 10 MG/ACT (2%) TD GEL: 40.0000 mg | Freq: Every day | TRANSDERMAL | Status: DC

## 2012-02-14 NOTE — Telephone Encounter (Signed)
FAXED refill request received from [facility] for Androgel.   PC to pt.  Pt is taking [place].  Denial for Androgel sent to pharmacy.  RX for [employer] faxed to pharmacy also.  Pt also given one month supply of Cialis.  Pt is made aware that he is due for a follow up and only one month supply will be given and he must be seen before more refills given. He is agreeable with this plan and will call back to schedule appt.

## 2012-04-05 ENCOUNTER — Emergency Department (HOSPITAL_BASED_OUTPATIENT_CLINIC_OR_DEPARTMENT_OTHER)
Admission: EM | Admit: 2012-04-05 | Discharge: 2012-04-06 | Disposition: A | Discharge status: Home / Self Care | Payer: PRIVATE HEALTH INSURANCE | Attending: Emergency Medicine | Admitting: Emergency Medicine

## 2012-04-05 ENCOUNTER — Encounter (HOSPITAL_BASED_OUTPATIENT_CLINIC_OR_DEPARTMENT_OTHER): Payer: Self-pay | Admitting: *Deleted

## 2012-04-05 DIAGNOSIS — Z8589 Personal history of malignant neoplasm of other organs and systems: Secondary | ICD-10-CM | POA: Insufficient documentation

## 2012-04-05 DIAGNOSIS — Z8719 Personal history of other diseases of the digestive system: Secondary | ICD-10-CM | POA: Insufficient documentation

## 2012-04-05 DIAGNOSIS — I10 Essential (primary) hypertension: Secondary | ICD-10-CM | POA: Insufficient documentation

## 2012-04-05 DIAGNOSIS — R110 Nausea: Secondary | ICD-10-CM | POA: Insufficient documentation

## 2012-04-05 DIAGNOSIS — Z87448 Personal history of other diseases of urinary system: Secondary | ICD-10-CM | POA: Insufficient documentation

## 2012-04-05 DIAGNOSIS — Z87442 Personal history of urinary calculi: Secondary | ICD-10-CM | POA: Insufficient documentation

## 2012-04-05 DIAGNOSIS — E785 Hyperlipidemia, unspecified: Secondary | ICD-10-CM | POA: Insufficient documentation

## 2012-04-05 DIAGNOSIS — R319 Hematuria, unspecified: Secondary | ICD-10-CM | POA: Insufficient documentation

## 2012-04-05 DIAGNOSIS — R109 Unspecified abdominal pain: Secondary | ICD-10-CM | POA: Insufficient documentation

## 2012-04-05 DIAGNOSIS — E291 Testicular hypofunction: Secondary | ICD-10-CM | POA: Insufficient documentation

## 2012-04-05 LAB — CBC WITH DIFFERENTIAL/PLATELET
Eosinophils Absolute: 0.2 10*3/uL (ref 0.0–0.7)
Eosinophils Relative: 3 % (ref 0–5)
HCT: 49.6 % (ref 39.0–52.0)
Hemoglobin: 16.9 g/dL (ref 13.0–17.0)
Lymphocytes Relative: 16 % (ref 12–46)
Lymphs Abs: 1.3 10*3/uL (ref 0.7–4.0)
MCH: 29.9 pg (ref 26.0–34.0)
MCV: 87.6 fL (ref 78.0–100.0)
Monocytes Absolute: 0.5 10*3/uL (ref 0.1–1.0)
Monocytes Relative: 7 % (ref 3–12)
RBC: 5.66 MIL/uL (ref 4.22–5.81)
WBC: 7.7 10*3/uL (ref 4.0–10.5)

## 2012-04-05 LAB — URINALYSIS, ROUTINE W REFLEX MICROSCOPIC
Bilirubin Urine: NEGATIVE
Glucose, UA: NEGATIVE mg/dL
Specific Gravity, Urine: 1.021 (ref 1.005–1.030)
Urobilinogen, UA: 0.2 mg/dL (ref 0.0–1.0)
pH: 6 (ref 5.0–8.0)

## 2012-04-05 LAB — COMPREHENSIVE METABOLIC PANEL
ALT: 23 U/L (ref 0–53)
BUN: 20 mg/dL (ref 6–23)
CO2: 24 mEq/L (ref 19–32)
Calcium: 9.6 mg/dL (ref 8.4–10.5)
GFR calc Af Amer: 73 mL/min — ABNORMAL LOW (ref >90)
GFR calc non Af Amer: 63 mL/min — ABNORMAL LOW (ref >90)
Glucose, Bld: 127 mg/dL — ABNORMAL HIGH (ref 70–99)
Sodium: 141 mEq/L (ref 135–145)
Total Protein: 7.5 g/dL (ref 6.0–8.3)

## 2012-04-05 LAB — URINE MICROSCOPIC-ADD ON

## 2012-04-05 LAB — LIPASE, BLOOD: Lipase: 47 U/L (ref 11–59)

## 2012-04-05 MED ORDER — FENTANYL CITRATE 0.05 MG/ML IJ SOLN
INTRAMUSCULAR | Status: AC
  Administered 2012-04-05: 100 ug
  Filled 2012-04-05: qty 2

## 2012-04-05 MED ORDER — TAMSULOSIN HCL 0.4 MG PO CAPS: 0.4000 mg | ORAL_CAPSULE | Freq: Two times a day (BID) | ORAL | Status: DC

## 2012-04-05 MED ORDER — OXYCODONE-ACETAMINOPHEN 5-325 MG PO TABS: 1.0000 | ORAL_TABLET | ORAL | Status: DC | PRN

## 2012-04-05 MED ORDER — ONDANSETRON HCL 4 MG/2ML IJ SOLN
INTRAMUSCULAR | Status: AC
  Administered 2012-04-05: 4 mg
  Filled 2012-04-05: qty 2

## 2012-04-05 NOTE — ED Notes (Signed)
Pt describes sudden onset RLQ pain onset 3p today. +nausea. Denies other s/s. Nrl BM this a.m., but took MOM.

## 2012-04-05 NOTE — ED Provider Notes (Signed)
History     CSN: [id number]  Arrival date & time 04/05/12  2142   First MD Initiated Contact with Patient 04/05/12 2232      Chief Complaint  Patient presents with  . Abdominal Pain    (Consider location/radiation/quality/duration/timing/severity/associated sxs/prior treatment) HPI Comments: Pt presents to the ED for sudden onset of right flank pain since 3pm this afternoon.  Pain described as constant, sharp, and non-radiating.  Pt is having nausea on arrival to ED.  BM earlier today.   Pt denies any fever, vomiting, diarrhea, dysuria, or hematuria.   Pt has gallbladder but denies any recent intake of fried or fatty foods.  No sick contacts.  Hx of renal stones, last episode approx 1 month ago.  Pt states sx were similar to these.  Patient is a 52 y.o. male presenting with abdominal pain. The history is provided by the patient.  Abdominal Pain Associated symptoms: nausea     Past Medical History  Diagnosis Date  . Nephrolithiasis 04/1999 & 11/2003    w/right hydronephrosis on u/s 04/1999 & on CT 11/2003  . Acquired renal cyst of left kidney 11/2003    Complicated cyst vs solid mass: contrast enhanced CT was recommended but ?apparently not done?  . Cholelithiasis 11/2003    Noted on noncontrast CT abd.  No cholecystitis.  . Hypogonadism male 02/2010  . Hypertension   . Chronic renal insufficiency, stage II (mild)     Borderline stage II/III (CrCl estimate @ low 60s)  . Polycythemia 05/10/2010    EPO level normal  . Hyperlipidemia     Past Surgical History  Procedure Laterality Date  . Knee surgery  2002  . Colonoscopy  09/30/11    Normal-repeat in 10 yrs    Family History  Problem Relation Age of Onset  . Hypertension Mother   . Cancer Father     lung/ smoker  . Kidney disease Brother   . HIV Brother     History  Substance Use Topics  . Smoking status: Never Smoker   . Smokeless tobacco: Never Used  . Alcohol Use: 1.8 - 2.4 oz/week    3-4 Glasses of wine per  week     Comment: 2 glasses of wine 2 days a week      Review of Systems  Gastrointestinal: Positive for nausea and abdominal pain.  Genitourinary: Positive for flank pain.  All other systems reviewed and are negative.    Allergies  Review of patient's allergies indicates no known allergies.  Home Medications   Current Outpatient Rx  Name  Route  Sig  Dispense  Refill  . amLODipine (NORVASC) 10 MG tablet   Oral   Take 1 tablet (10 mg total) by mouth daily.   90 tablet   2   . atorvastatin (LIPITOR) 40 MG tablet   Oral   Take 1 tablet (40 mg total) by mouth daily.   90 tablet   2   . fluticasone (FLONASE) 50 MCG/ACT nasal spray               . tadalafil (CIALIS) 10 MG tablet   Oral   Take 1 tablet (10 mg total) by mouth daily as needed for erectile dysfunction.   10 tablet   0   . Testosterone (FORTESTA) 10 MG/ACT (2%) GEL   Transdermal   Place 40 mg onto the skin daily. (2 pumps applied to each thigh once daily)   60 g   0  BP 170/95  Pulse 84  Temp(Src) 98.5 F (36.9 C) (Oral)  Ht 5\' 11"  (1.803 m)  Wt 205 lb (92.987 kg)  BMI 28.6 kg/m2  SpO2 95%  Physical Exam  Nursing note and vitals reviewed. Constitutional: He is oriented to person, place, and time. He appears well-developed and well-nourished.  HENT:  Head: Normocephalic and atraumatic.  Mouth/Throat: Oropharynx is clear and moist.  Eyes: Conjunctivae and EOM are normal. Pupils are equal, round, and reactive to light.  Neck: Normal range of motion. Neck supple.  Cardiovascular: Normal rate, regular rhythm and normal heart sounds.   Pulmonary/Chest: Effort normal and breath sounds normal.  Abdominal: Soft. Bowel sounds are normal. There is tenderness (R flank) in the right upper quadrant.  Neurological: He is alert and oriented to person, place, and time. He has normal strength. No cranial nerve deficit or sensory deficit.  Skin: Skin is warm and dry.  Psychiatric: He has a normal mood  and affect.    ED Course  Procedures (including critical care time)  Labs Reviewed  COMPREHENSIVE METABOLIC PANEL - Abnormal; Notable for the following:    Glucose, Bld 127 (*)    Total Bilirubin 0.2 (*)    GFR calc non Af Amer 63 (*)    GFR calc Af Amer 73 (*)    All other components within normal limits  CBC WITH DIFFERENTIAL  LIPASE, BLOOD  URINALYSIS, ROUTINE W REFLEX MICROSCOPIC   No results found.   1. Right flank pain   2. Hematuria       MDM   Good resolution of sx with IV Fentanyl and zofran.  Presentation of R flank pain radiating to groin and u/a with moderate blood consistent with renal calculus.  Offered CT scan, pt declined. Stated he felt much better and would like to be tx symptomatically.  Rx flomax and percocet.  Return precautions advised- may return for CT scan if desired.        Garlon Hatchet, PA-C 04/06/12 1746

## 2012-04-07 LAB — URINE CULTURE: Colony Count: NO GROWTH

## 2012-04-08 NOTE — ED Provider Notes (Signed)
Medical screening examination/treatment/procedure(s) were performed by non-physician practitioner and as supervising physician I was immediately available for consultation/collaboration.   Liliani Bobo L Waymond Meador, MD 04/08/12 2238 

## 2012-06-23 ENCOUNTER — Other Ambulatory Visit: Payer: Self-pay | Admitting: *Deleted

## 2012-06-23 NOTE — Telephone Encounter (Signed)
Refill request for Fortesta Last filled by MD on -02/14/12 #60 x0 Last seen-07/15/11 F/u- none Please advise refill?

## 2012-06-24 ENCOUNTER — Telehealth: Payer: Self-pay | Admitting: *Deleted

## 2012-06-24 NOTE — Telephone Encounter (Signed)
[facility]   NOTE from 01.31.14:RX for Fortesta faxed to pharmacy also. Pt also given one month supply of Cialis. Pt is made aware that he is due for a follow up and only one month supply will be given and he must be seen before more refills given. He is agreeable with this plan and will call back to schedule appt  Last Seen: 07.01.2013 Return: 11-Month; No appointments have been made Please advise on refills/SLS

## 2012-07-02 ENCOUNTER — Emergency Department (HOSPITAL_BASED_OUTPATIENT_CLINIC_OR_DEPARTMENT_OTHER): Payer: PRIVATE HEALTH INSURANCE

## 2012-07-02 ENCOUNTER — Encounter (HOSPITAL_BASED_OUTPATIENT_CLINIC_OR_DEPARTMENT_OTHER): Payer: Self-pay | Admitting: *Deleted

## 2012-07-02 ENCOUNTER — Emergency Department (HOSPITAL_BASED_OUTPATIENT_CLINIC_OR_DEPARTMENT_OTHER)
Admission: EM | Admit: 2012-07-02 | Discharge: 2012-07-02 | Disposition: A | Discharge status: Home / Self Care | Payer: PRIVATE HEALTH INSURANCE | Attending: Emergency Medicine | Admitting: Emergency Medicine

## 2012-07-02 ENCOUNTER — Ambulatory Visit: Payer: PRIVATE HEALTH INSURANCE | Admitting: Family Medicine

## 2012-07-02 DIAGNOSIS — I129 Hypertensive chronic kidney disease with stage 1 through stage 4 chronic kidney disease, or unspecified chronic kidney disease: Secondary | ICD-10-CM | POA: Insufficient documentation

## 2012-07-02 DIAGNOSIS — N182 Chronic kidney disease, stage 2 (mild): Secondary | ICD-10-CM | POA: Insufficient documentation

## 2012-07-02 DIAGNOSIS — R51 Headache: Secondary | ICD-10-CM | POA: Insufficient documentation

## 2012-07-02 DIAGNOSIS — Z8719 Personal history of other diseases of the digestive system: Secondary | ICD-10-CM | POA: Insufficient documentation

## 2012-07-02 DIAGNOSIS — Z8639 Personal history of other endocrine, nutritional and metabolic disease: Secondary | ICD-10-CM | POA: Insufficient documentation

## 2012-07-02 DIAGNOSIS — R42 Dizziness and giddiness: Secondary | ICD-10-CM | POA: Insufficient documentation

## 2012-07-02 DIAGNOSIS — Z87898 Personal history of other specified conditions: Secondary | ICD-10-CM | POA: Insufficient documentation

## 2012-07-02 DIAGNOSIS — Z862 Personal history of diseases of the blood and blood-forming organs and certain disorders involving the immune mechanism: Secondary | ICD-10-CM | POA: Insufficient documentation

## 2012-07-02 DIAGNOSIS — E785 Hyperlipidemia, unspecified: Secondary | ICD-10-CM | POA: Insufficient documentation

## 2012-07-02 DIAGNOSIS — Z87448 Personal history of other diseases of urinary system: Secondary | ICD-10-CM | POA: Insufficient documentation

## 2012-07-02 LAB — BASIC METABOLIC PANEL
CO2: 25 mEq/L (ref 19–32)
Chloride: 103 mEq/L (ref 96–112)
Glucose, Bld: 91 mg/dL (ref 70–99)
Potassium: 3.8 mEq/L (ref 3.5–5.1)
Sodium: 140 mEq/L (ref 135–145)

## 2012-07-02 LAB — CBC WITH DIFFERENTIAL/PLATELET
Basophils Absolute: 0 10*3/uL (ref 0.0–0.1)
Eosinophils Relative: 4 % (ref 0–5)
HCT: 51.7 % (ref 39.0–52.0)
Lymphocytes Relative: 24 % (ref 12–46)
MCV: 86.9 fL (ref 78.0–100.0)
Monocytes Relative: 7 % (ref 3–12)
Platelets: 168 10*3/uL (ref 150–400)
RBC: 5.95 MIL/uL — ABNORMAL HIGH (ref 4.22–5.81)
RDW: 13 % (ref 11.5–15.5)
WBC: 7.1 10*3/uL (ref 4.0–10.5)

## 2012-07-02 LAB — TROPONIN I: Troponin I: <0.3 ng/mL (ref <0.30)

## 2012-07-02 MED ORDER — MECLIZINE HCL 25 MG PO TABS
50.0000 mg | ORAL_TABLET | Freq: Once | ORAL | Status: AC
  Administered 2012-07-02: 50 mg via ORAL
  Filled 2012-07-02 (×2): qty 1

## 2012-07-02 MED ORDER — MECLIZINE HCL 12.5 MG PO TABS: 25.0000 mg | ORAL_TABLET | Freq: Three times a day (TID) | ORAL | Status: DC | PRN

## 2012-07-02 MED ORDER — KETOROLAC TROMETHAMINE 30 MG/ML IJ SOLN
30.0000 mg | Freq: Once | INTRAMUSCULAR | Status: AC
  Administered 2012-07-02: 30 mg via INTRAVENOUS
  Filled 2012-07-02: qty 1

## 2012-07-02 NOTE — ED Notes (Signed)
Dizziness. States he had an episode of this a couple of weeks ago that resolved without treatment. Headache.

## 2012-07-02 NOTE — ED Provider Notes (Signed)
History     CSN: [id number]  Arrival date & time 07/02/12  1455   First MD Initiated Contact with Patient 07/02/12 1544      Chief Complaint  Patient presents with  . Dizziness    (Consider location/radiation/quality/duration/timing/severity/associated sxs/prior treatment) HPI Comments: Pt is c/o dizziness today:pt states that it is like the room spinning:pt states that he had a similar episode about a week ago and it resolved on its own:pt states that he also had a headache today:pt denies cp, sob, visual changes, n/v/d:symptoms are worse with change of position  The history is provided by the patient. No language interpreter was used.    Past Medical History  Diagnosis Date  . Nephrolithiasis 04/1999 & 11/2003    w/right hydronephrosis on u/s 04/1999 & on CT 11/2003  . Acquired renal cyst of left kidney 11/2003    Complicated cyst vs solid mass: contrast enhanced CT was recommended but ?apparently not done?  . Cholelithiasis 11/2003    Noted on noncontrast CT abd.  No cholecystitis.  . Hypogonadism male 02/2010  . Hypertension   . Chronic renal insufficiency, stage II (mild)     Borderline stage II/III (CrCl estimate @ low 60s)  . Polycythemia 05/10/2010    EPO level normal  . Hyperlipidemia     Past Surgical History  Procedure Laterality Date  . Knee surgery  2002  . Colonoscopy  09/30/11    Normal-repeat in 10 yrs    Family History  Problem Relation Age of Onset  . Hypertension Mother   . Cancer Father     lung/ smoker  . Kidney disease Brother   . HIV Brother     History  Substance Use Topics  . Smoking status: Never Smoker   . Smokeless tobacco: Never Used  . Alcohol Use: 1.8 - 2.4 oz/week    3-4 Glasses of wine per week     Comment: 2 glasses of wine 2 days a week      Review of Systems  Constitutional: Negative.   Respiratory: Negative.   Cardiovascular: Negative.     Allergies  Review of patient's allergies indicates no known  allergies.  Home Medications   Current Outpatient Rx  Name  Route  Sig  Dispense  Refill  . amLODipine (NORVASC) 10 MG tablet   Oral   Take 1 tablet (10 mg total) by mouth daily.   90 tablet   2   . atorvastatin (LIPITOR) 40 MG tablet   Oral   Take 1 tablet (40 mg total) by mouth daily.   90 tablet   2   . fluticasone (FLONASE) 50 MCG/ACT nasal spray               . oxyCODONE-acetaminophen (PERCOCET) 5-325 MG per tablet   Oral   Take 1 tablet by mouth every 4 (four) hours as needed for pain.   20 tablet   0   . tadalafil (CIALIS) 10 MG tablet   Oral   Take 1 tablet (10 mg total) by mouth daily as needed for erectile dysfunction.   10 tablet   0   . tamsulosin (FLOMAX) 0.4 MG CAPS   Oral   Take 1 capsule (0.4 mg total) by mouth 2 (two) times daily.   10 capsule   0   . Testosterone (FORTESTA) 10 MG/ACT (2%) GEL   Transdermal   Place 40 mg onto the skin daily. (2 pumps applied to each thigh once daily)   60  g   0     BP 143/89  Pulse 86  Temp(Src) 98.7 F (37.1 C) (Oral)  Resp 20  Wt 205 lb (92.987 kg)  BMI 28.6 kg/m2  SpO2 99%  Physical Exam  Nursing note and vitals reviewed. Constitutional: He is oriented to person, place, and time. He appears well-developed and well-nourished.  HENT:  Head: Normocephalic and atraumatic.  Eyes: Conjunctivae and EOM are normal.  Neck: Normal range of motion. Neck supple.  Cardiovascular: Normal rate and regular rhythm.   Pulmonary/Chest: Effort normal and breath sounds normal.  Musculoskeletal: Normal range of motion. He exhibits no edema.  Neurological: He is alert and oriented to person, place, and time. No cranial nerve deficit or sensory deficit. He exhibits normal muscle tone. He displays a negative Romberg sign. Coordination normal.  Skin: Skin is warm and dry.  Psychiatric: He has a normal mood and affect.    ED Course  Procedures (including critical care time)  Labs Reviewed  CBC WITH DIFFERENTIAL -  Abnormal; Notable for the following:    RBC 5.95 (*)    Hemoglobin 17.9 (*)    All other components within normal limits  BASIC METABOLIC PANEL - Abnormal; Notable for the following:    GFR calc non Af Amer 68 (*)    GFR calc Af Amer 79 (*)    All other components within normal limits  TROPONIN I   Dg Chest 2 View  07/02/2012   *RADIOLOGY REPORT*  Clinical Data: Dizziness.  CHEST - 2 VIEW  Comparison:  07/14/2006  Findings:  The heart size and mediastinal contours are within normal limits.  Both lungs are clear.  The visualized skeletal structures are unremarkable.  IMPRESSION: No active cardiopulmonary disease.   Original Report Authenticated By: Richarda Overlie, M.D.   Ct Head Wo Contrast  07/02/2012   *RADIOLOGY REPORT*  Clinical Data:  Headache.  Dizziness.  Hypertension.  CT HEAD WITHOUT CONTRAST  Technique: Contiguous axial images were obtained from the base of the skull through the vertex without contrast  Comparison: Brain MRI on 07/27/2011  Findings:  There is no evidence of intracranial hemorrhage, brain edema, or other signs of acute infarction.  There is no evidence of intracranial mass lesion or mass effect.  No abnormal extraaxial fluid collections are identified.  There is no evidence of hydrocephalus, or other significant intracranial abnormality.  No skull abnormality identified.  IMPRESSION: Negative non-contrast head CT.   Original Report Authenticated By: Myles Rosenthal, M.D.    Date: 07/02/2012  Rate: 71  Rhythm: normal sinus rhythm  QRS Axis: normal  Intervals: normal  ST/T Wave abnormalities: normal  Conduction Disutrbances:none  Narrative Interpretation:   Old EKG Reviewed: none available    1. Vertigo       MDM  Pt is feeling better at this time:pt is ambulating without any problem:pt is okay to follow up as needed       Teressa Lower, NP 07/02/12 1813

## 2012-07-02 NOTE — ED Notes (Signed)
Pt. Reports feeling better and the headache is better now.  Pt. Walked around nurses station with no difficulty.

## 2012-07-02 NOTE — ED Provider Notes (Signed)
Medical screening examination/treatment/procedure(s) were performed by non-physician practitioner and as supervising physician I was immediately available for consultation/collaboration.   Deyanira Fesler, MD 07/02/12 2259 

## 2012-07-13 ENCOUNTER — Ambulatory Visit: Payer: PRIVATE HEALTH INSURANCE | Admitting: Family Medicine

## 2012-07-14 ENCOUNTER — Ambulatory Visit (INDEPENDENT_AMBULATORY_CARE_PROVIDER_SITE_OTHER): Payer: PRIVATE HEALTH INSURANCE | Admitting: Family Medicine

## 2012-07-14 ENCOUNTER — Encounter: Payer: Self-pay | Admitting: Family Medicine

## 2012-07-14 VITALS — BP 144/98 | HR 74 | Temp 97.8°F | Resp 18 | Ht 71.0 in | Wt 214.0 lb

## 2012-07-14 DIAGNOSIS — E291 Testicular hypofunction: Secondary | ICD-10-CM

## 2012-07-14 DIAGNOSIS — R5383 Other fatigue: Secondary | ICD-10-CM

## 2012-07-14 DIAGNOSIS — R5381 Other malaise: Secondary | ICD-10-CM

## 2012-07-14 DIAGNOSIS — I10 Essential (primary) hypertension: Secondary | ICD-10-CM

## 2012-07-14 MED ORDER — LISINOPRIL 10 MG PO TABS: 10.0000 mg | ORAL_TABLET | Freq: Every day | ORAL | Status: DC

## 2012-07-14 NOTE — Progress Notes (Signed)
OFFICE NOTE  07/14/2012  CC:  Chief Complaint  Patient presents with  . Hospitalization Follow-up  . Hypertension  . Fatigue     HPI: Patient is a 52 y.o. African-American male who is here for HTN f/u and recent ED visit for elevated bp and vertigo.  I reviewed his recent ED visit notes, EKG, CXR, CT brain--all unremarkable.  Dx was vertigo.  Was given meclizine in ED x 1 and he feels like it helped some. He did not take any of them after d/c from ED.  No recurrence of sx's since ED visit.  He does describe some vertiginous sensation with head movements, no n/v.  He did have a HA.  No ringing in ears. No hearing deficit.  He did have the sx's the day prior to ED visit as well. BPs at work are consistently stage 1 HTN. Always feeling tired--common complaint for him.  Denies snoring or apneic spells in sleep.  Always feels headachy. Work is stressful.    He feels no difference on testosterone replacement and has actually not been taking his testost gel for over a month. Denies depressed mood.    Pertinent PMH:  Past Medical History  Diagnosis Date  . Nephrolithiasis 04/1999 & 11/2003    w/right hydronephrosis on u/s 04/1999 & on CT 11/2003  . Acquired renal cyst of left kidney 11/2003    Complicated cyst vs solid mass: contrast enhanced CT was recommended but ?apparently not done?  . Cholelithiasis 11/2003    Noted on noncontrast CT abd.  No cholecystitis.  . Hypogonadism male 02/2010  . Hypertension   . Chronic renal insufficiency, stage II (mild)     Borderline stage II/III (CrCl estimate @ low 60s)  . Polycythemia 05/10/2010    EPO level normal  . Hyperlipidemia    Past surgical, social, and family history reviewed and no changes noted since last office visit.  MEDS:  Outpatient Prescriptions Prior to Visit  Medication Sig Dispense Refill  . amLODipine (NORVASC) 10 MG tablet Take 1 tablet (10 mg total) by mouth daily.  90 tablet  2  . atorvastatin (LIPITOR) 40 MG tablet  Take 1 tablet (40 mg total) by mouth daily.  90 tablet  2  . fluticasone (FLONASE) 50 MCG/ACT nasal spray       . meclizine (ANTIVERT) 12.5 MG tablet Take 2 tablets (25 mg total) by mouth 3 (three) times daily as needed.  30 tablet  0  . tadalafil (CIALIS) 10 MG tablet Take 1 tablet (10 mg total) by mouth daily as needed for erectile dysfunction.  10 tablet  0  . Testosterone (FORTESTA) 10 MG/ACT (2%) GEL Place 40 mg onto the skin daily. (2 pumps applied to each thigh once daily)  60 g  0  . oxyCODONE-acetaminophen (PERCOCET) 5-325 MG per tablet Take 1 tablet by mouth every 4 (four) hours as needed for pain.  20 tablet  0  . tamsulosin (FLOMAX) 0.4 MG CAPS Take 1 capsule (0.4 mg total) by mouth 2 (two) times daily.  10 capsule  0   No facility-administered medications prior to visit.    PE: Blood pressure 144/98, pulse 74, temperature 97.8 F (36.6 C), temperature source Oral, resp. rate 18, height 5\' 11"  (1.803 m), weight 214 lb (97.07 kg), SpO2 95.00%. Gen: Alert, well appearing.  Patient is oriented to person, place, time, and situation. Neck - No masses or thyromegaly or limitation in range of motion CV: RRR, no m/r/g.   LUNGS: CTA  bilat, nonlabored resps, good aeration in all lung fields.  IMPRESSION AND PLAN:  Hypogonadism male Stop testosterone gel since this has been of no help from a symptom standpoint.  HTN (hypertension), benign Poor control. Add lisinopril 10mg  qd.  Other malaise and fatigue No red flags. I would like him to see how he feels after getting 7-8 hours of sleep nightly for 1 full month.   An After Visit Summary was printed and given to the patient.  FOLLOW UP: 30mo

## 2012-07-15 ENCOUNTER — Telehealth: Payer: Self-pay | Admitting: Family Medicine

## 2012-07-15 NOTE — Telephone Encounter (Signed)
Returned fax to pharmacy to D/C medication.

## 2012-07-15 NOTE — Telephone Encounter (Signed)
No, he is not taking this med anymore.  Pls tell pharmacy to cancel this rx for him.-thx

## 2012-07-15 NOTE — Telephone Encounter (Signed)
Pharmacy sent request for Fortesta 10 mg Gel Pump sig apply 2 pumps to each thigh daily.  I couldn't see where you rx'ed this for patient but fax has your name on it.  Please advise refill.

## 2012-07-31 ENCOUNTER — Telehealth: Payer: Self-pay | Admitting: Family Medicine

## 2012-07-31 MED ORDER — TESTOSTERONE 10 MG/ACT (2%) TD GEL: 40.0000 mg | Freq: Every day | TRANSDERMAL | Status: DC

## 2012-07-31 NOTE — Telephone Encounter (Signed)
OK, rx printed. 

## 2012-07-31 NOTE — Telephone Encounter (Signed)
Patient wants to get his fortesta filled. He said his rx ran out.  Please advise.

## 2012-08-03 NOTE — Telephone Encounter (Signed)
Faxed rx to pharmacy  

## 2012-08-07 DIAGNOSIS — R5383 Other fatigue: Secondary | ICD-10-CM | POA: Insufficient documentation

## 2012-08-07 DIAGNOSIS — R5381 Other malaise: Secondary | ICD-10-CM | POA: Insufficient documentation

## 2012-08-07 NOTE — Assessment & Plan Note (Signed)
Stop testosterone gel since this has been of no help from a symptom standpoint.

## 2012-08-07 NOTE — Assessment & Plan Note (Signed)
Poor control. Add lisinopril 10mg  qd.

## 2012-08-07 NOTE — Assessment & Plan Note (Signed)
No red flags. I would like him to see how he feels after getting 7-8 hours of sleep nightly for 1 full month.

## 2012-08-13 ENCOUNTER — Ambulatory Visit: Payer: PRIVATE HEALTH INSURANCE | Admitting: Family Medicine

## 2012-11-26 ENCOUNTER — Other Ambulatory Visit: Payer: Self-pay | Admitting: Family Medicine

## 2012-11-26 NOTE — Telephone Encounter (Signed)
Pharmacy fax requesting refill of amlodipine but it looks like it was d/c.  Please advise refill.

## 2013-06-27 ENCOUNTER — Emergency Department (HOSPITAL_BASED_OUTPATIENT_CLINIC_OR_DEPARTMENT_OTHER): Payer: PRIVATE HEALTH INSURANCE

## 2013-06-27 ENCOUNTER — Emergency Department (HOSPITAL_BASED_OUTPATIENT_CLINIC_OR_DEPARTMENT_OTHER)
Admission: EM | Admit: 2013-06-27 | Discharge: 2013-06-27 | Disposition: A | Discharge status: Home / Self Care | Payer: PRIVATE HEALTH INSURANCE | Attending: Emergency Medicine | Admitting: Emergency Medicine

## 2013-06-27 ENCOUNTER — Encounter (HOSPITAL_BASED_OUTPATIENT_CLINIC_OR_DEPARTMENT_OTHER): Payer: Self-pay | Admitting: Emergency Medicine

## 2013-06-27 DIAGNOSIS — Z8719 Personal history of other diseases of the digestive system: Secondary | ICD-10-CM | POA: Insufficient documentation

## 2013-06-27 DIAGNOSIS — J069 Acute upper respiratory infection, unspecified: Secondary | ICD-10-CM | POA: Insufficient documentation

## 2013-06-27 DIAGNOSIS — Z87442 Personal history of urinary calculi: Secondary | ICD-10-CM | POA: Insufficient documentation

## 2013-06-27 DIAGNOSIS — R110 Nausea: Secondary | ICD-10-CM | POA: Insufficient documentation

## 2013-06-27 DIAGNOSIS — I129 Hypertensive chronic kidney disease with stage 1 through stage 4 chronic kidney disease, or unspecified chronic kidney disease: Secondary | ICD-10-CM | POA: Insufficient documentation

## 2013-06-27 DIAGNOSIS — Z79899 Other long term (current) drug therapy: Secondary | ICD-10-CM | POA: Insufficient documentation

## 2013-06-27 DIAGNOSIS — R071 Chest pain on breathing: Secondary | ICD-10-CM | POA: Insufficient documentation

## 2013-06-27 DIAGNOSIS — IMO0002 Reserved for concepts with insufficient information to code with codable children: Secondary | ICD-10-CM | POA: Insufficient documentation

## 2013-06-27 DIAGNOSIS — Z862 Personal history of diseases of the blood and blood-forming organs and certain disorders involving the immune mechanism: Secondary | ICD-10-CM | POA: Insufficient documentation

## 2013-06-27 DIAGNOSIS — E785 Hyperlipidemia, unspecified: Secondary | ICD-10-CM | POA: Insufficient documentation

## 2013-06-27 DIAGNOSIS — N182 Chronic kidney disease, stage 2 (mild): Secondary | ICD-10-CM | POA: Insufficient documentation

## 2013-06-27 MED ORDER — BENZONATATE 100 MG PO CAPS: 100.0000 mg | ORAL_CAPSULE | Freq: Three times a day (TID) | ORAL | Status: DC | PRN

## 2013-06-27 MED ORDER — IBUPROFEN 400 MG PO TABS
600.0000 mg | ORAL_TABLET | Freq: Once | ORAL | Status: AC
  Administered 2013-06-27: 600 mg via ORAL
  Filled 2013-06-27 (×2): qty 1

## 2013-06-27 NOTE — ED Provider Notes (Signed)
CSN: [id number]     Arrival date & time 06/27/13  4098 History   First MD Initiated Contact with Patient 06/27/13 [id number]     Chief Complaint  Patient presents with  . Cough     (Consider location/radiation/quality/duration/timing/severity/associated sxs/prior Treatment) HPI 53 year old male presents with 3 days of cough, headache, myalgias, and shortness of breath. He states when he takes deep breath in his chest hurts. He started having a fever yesterday, with a maximum temperature of 101. He last had Tylenol just prior to arrival. When he is not coughing or taking deep breaths he has no shortness of breath. He is having a dry cough. His headache started with a cough in the myalgias. This feels similar to when he had pneumonia in the past and to avoid to get it checked out. He has no prior history of lung disease. He felt nauseous when this started but has no further nausea.  Past Medical History  Diagnosis Date  . Nephrolithiasis 04/1999 & 11/2003    w/right hydronephrosis on u/s 04/1999 & on CT 11/2003  . Acquired renal cyst of left kidney [id number]/[id number]    Complicated cyst vs solid mass: contrast enhanced CT was recommended but ?apparently not done?  . Cholelithiasis 11/2003    Noted on noncontrast CT abd.  No cholecystitis.  . Hypogonadism male 02/2010  . Hypertension   . Chronic renal insufficiency, stage II (mild)     Borderline stage II/III (CrCl estimate @ low 60s)  . Polycythemia 05/10/2010    EPO level normal  . Hyperlipidemia    Past Surgical History  Procedure Laterality Date  . Knee surgery  2002  . Colonoscopy  09/30/11    Normal-repeat in 10 yrs   Family History  Problem Relation Age of Onset  . Hypertension Mother   . Cancer Father     lung/ smoker  . Kidney disease Brother   . HIV Brother    History  Substance Use Topics  . Smoking status: Never Smoker   . Smokeless tobacco: Never Used  . Alcohol Use: 1.8 - 2.4 oz/week    3-4 Glasses of wine per week     Comment:  2 glasses of wine 2 days a week    Review of Systems  Constitutional: Positive for fever.  Respiratory: Positive for cough and shortness of breath.   Cardiovascular: Positive for chest pain.  Gastrointestinal: Positive for nausea. Negative for vomiting.  Musculoskeletal: Positive for myalgias.  Neurological: Positive for headaches.  All other systems reviewed and are negative.     Allergies  Review of patient's allergies indicates no known allergies.  Home Medications   Prior to Admission medications   Medication Sig Start Date End Date Taking? Authorizing Provider  amLODipine (NORVASC) 10 MG tablet Take 1 tablet (10 mg total) by mouth daily. 07/15/11 06/27/13 Yes Tammi Sou, MD  atorvastatin (LIPITOR) 40 MG tablet Take 1 tablet (40 mg total) by mouth daily. 07/15/11  Yes Tammi Sou, MD  fluticasone Asencion Islam) 50 MCG/ACT nasal spray  09/02/11  Yes Historical Provider, MD  lisinopril (PRINIVIL,ZESTRIL) 10 MG tablet Take 1 tablet (10 mg total) by mouth daily. 07/14/12   Tammi Sou, MD  meclizine (ANTIVERT) 12.5 MG tablet Take 2 tablets (25 mg total) by mouth 3 (three) times daily as needed. 07/02/12   Glendell Docker, NP  oxyCODONE-acetaminophen (PERCOCET) 5-325 MG per tablet Take 1 tablet by mouth every 4 (four) hours as needed for pain. 04/05/12   Vilinda Blanks  Baird Cancer, PA-C  tadalafil (CIALIS) 10 MG tablet Take 1 tablet (10 mg total) by mouth daily as needed for erectile dysfunction. 02/14/12   Tammi Sou, MD  tamsulosin (FLOMAX) 0.4 MG CAPS Take 1 capsule (0.4 mg total) by mouth 2 (two) times daily. 04/05/12   Larene Pickett, PA-C  Testosterone (FORTESTA) 10 MG/ACT (2%) GEL Place 40 mg onto the skin daily. (2 pumps applied to each thigh once daily) 07/31/12   Tammi Sou, MD   BP 138/84  Pulse 91  Temp(Src) 98.3 F (36.8 C) (Oral)  Resp 21  Ht 6' (1.829 m)  Wt 205 lb (92.987 kg)  BMI 27.80 kg/m2  SpO2 93% Physical Exam  Nursing note and vitals  reviewed. Constitutional: He is oriented to person, place, and time. He appears well-developed and well-nourished. No distress.  HENT:  Head: Normocephalic and atraumatic.  Right Ear: External ear normal.  Left Ear: External ear normal.  Nose: Nose normal.  Eyes: Right eye exhibits no discharge. Left eye exhibits no discharge.  Neck: Neck supple.  Cardiovascular: Normal rate, regular rhythm, normal heart sounds and intact distal pulses.   Pulmonary/Chest: Effort normal and breath sounds normal. He has no wheezes. He has no rales. He exhibits tenderness (mild anterior chest wall tenderness).  Patient coughing frequently during lung exam. Auscultating difficult due to this but no obvious rales or wheezes heard  Abdominal: Soft. There is no tenderness.  Musculoskeletal: He exhibits no edema.  Neurological: He is alert and oriented to person, place, and time.  Skin: Skin is warm and dry.    ED Course  Procedures (including critical care time) Labs Review Labs Reviewed - No data to display  Imaging Review Dg Chest 2 View  06/27/2013   CLINICAL DATA:  Cough and congestion the for several days  EXAM: CHEST  2 VIEW  COMPARISON:  07/02/2012  FINDINGS: The heart size and mediastinal contours are within normal limits. Both lungs are clear. The visualized skeletal structures are unremarkable.  IMPRESSION: No active cardiopulmonary disease.   Electronically Signed   By: Kerby Moors M.D.   On: 06/27/2013 08:15     EKG Interpretation None      MDM   Final diagnoses:  Upper respiratory infection    Xray shows no pneumonia. Sats above 93%. No distress. Will treat with cough suppressant, tylenol/motrin, fluids and rest. Discussed return precautions. This is consistent with a viral URI. Recommend PCP f/u      Ephraim Hamburger, MD 06/27/13 [phone]

## 2013-06-27 NOTE — ED Notes (Signed)
Patient returned from xray.

## 2013-06-27 NOTE — ED Notes (Signed)
Pt reports cough, fever, shortness of breath, sore throat, headache, chest congestion since Friday. Reports this feels like past episodes of Pneumonia.

## 2013-06-27 NOTE — Discharge Instructions (Signed)

## 2013-06-30 ENCOUNTER — Encounter: Payer: Self-pay | Admitting: Nurse Practitioner

## 2013-06-30 ENCOUNTER — Ambulatory Visit (INDEPENDENT_AMBULATORY_CARE_PROVIDER_SITE_OTHER): Payer: PRIVATE HEALTH INSURANCE | Admitting: Nurse Practitioner

## 2013-06-30 VITALS — BP 132/84 | HR 83 | Temp 98.0°F | Ht 71.0 in | Wt 215.0 lb

## 2013-06-30 DIAGNOSIS — J209 Acute bronchitis, unspecified: Secondary | ICD-10-CM

## 2013-06-30 MED ORDER — HYDROCOD POLST-CHLORPHEN POLST 10-8 MG/5ML PO LQCR: 5.0000 mL | Freq: Every evening | ORAL | Status: DC | PRN

## 2013-06-30 MED ORDER — ALBUTEROL SULFATE HFA 108 (90 BASE) MCG/ACT IN AERS: 2.0000 | INHALATION_SPRAY | Freq: Two times a day (BID) | RESPIRATORY_TRACT | Status: DC

## 2013-06-30 NOTE — Patient Instructions (Addendum)
This is a viral illness. It may last 3-4 weeks. Continue to take benzonatate capsules during day. Use cough syrup at night. Rinse sinuses daily with Milta Deiters med sinus rinse or netty pot to decrease post nasal drip. Use inhaler twice daily until coughing less. Benzocaine throat lozenge may relieve frequent cough & soothe throat. Spoonful honey thinned w/lemon or warm water may be helpful also. Rest. Let us know if you develop fever or chest pain with inspiration.  Bronchitis Bronchitis is inflammation of the airways that extend from the windpipe into the lungs (bronchi). The inflammation often causes mucus to develop, which leads to a cough. If the inflammation becomes severe, it may cause shortness of breath. CAUSES  Bronchitis may be caused by:   Viral infections.   Bacteria.   Cigarette smoke.   Allergens, pollutants, and other irritants.  SIGNS AND SYMPTOMS  The most common symptom of bronchitis is a frequent cough that produces mucus. Other symptoms include:  Fever.   Body aches.   Chest congestion.   Chills.   Shortness of breath.   Sore throat.  DIAGNOSIS  Bronchitis is usually diagnosed through a medical history and physical exam. Tests, such as chest X-rays, are sometimes done to rule out other conditions.  TREATMENT  You may need to avoid contact with whatever caused the problem (smoking, for example). Medicines are sometimes needed. These may include:  Antibiotics. These may be prescribed if the condition is caused by bacteria.  Cough suppressants. These may be prescribed for relief of cough symptoms.   Inhaled medicines. These may be prescribed to help open your airways and make it easier for you to breathe.   Steroid medicines. These may be prescribed for those with recurrent (chronic) bronchitis. HOME CARE INSTRUCTIONS  Get plenty of rest.   Drink enough fluids to keep your urine clear or pale yellow (unless you have a medical condition that requires  fluid restriction). Increasing fluids may help thin your secretions and will prevent dehydration.   Only take over-the-counter or prescription medicines as directed by your health care provider.  Only take antibiotics as directed. Make sure you finish them even if you start to feel better.  Avoid secondhand smoke, irritating chemicals, and strong fumes. These will make bronchitis worse. If you are a smoker, quit smoking. Consider using nicotine gum or skin patches to help control withdrawal symptoms. Quitting smoking will help your lungs heal faster.   Put a cool-mist humidifier in your bedroom at night to moisten the air. This may help loosen mucus. Change the water in the humidifier daily. You can also run the hot water in your shower and sit in the bathroom with the door closed for 5 10 minutes.   Follow up with your health care provider as directed.   Wash your hands frequently to avoid catching bronchitis again or spreading an infection to others.  SEEK MEDICAL CARE IF: Your symptoms do not improve after 1 week of treatment.  SEEK IMMEDIATE MEDICAL CARE IF:  Your fever increases.  You have chills.   You have chest pain.   You have worsening shortness of breath.   You have bloody sputum.  You faint.  You have lightheadedness.  You have a severe headache.   You vomit repeatedly. MAKE SURE YOU:   Understand these instructions.  Will watch your condition.  Will get help right away if you are not doing well or get worse. Document Released: 12/31/2004 Document Revised: 10/21/2012 Document Reviewed: 08/25/2012 ExitCare Patient Information  2014 [place], [place].

## 2013-06-30 NOTE — Progress Notes (Signed)
Pre visit review using our clinic review tool, if applicable. No additional management support is needed unless otherwise documented below in the visit note. 

## 2013-06-30 NOTE — Progress Notes (Signed)
   Subjective:    Patient ID: Patrick Campbell, male    DOB: September 29, 1960, 53 y.o.   MRN: [id number]  Cough This is a new problem. The current episode started in the past 7 days. The problem has been unchanged. The problem occurs every few minutes. The cough is non-productive (productive in am). Associated symptoms include chest pain (substernal pain w/cough), a fever (resolved 2 da), headaches, nasal congestion, postnasal drip, a sore throat and wheezing. Pertinent negatives include no shortness of breath. The symptoms are aggravated by lying down. He has tried OTC cough suppressant (went to er few days ago. Nml CXR. Prescribed benzonatate capsules-no relief.) for the symptoms. The treatment provided no relief.      Review of Systems  Constitutional: Positive for fever (resolved 2 da) and fatigue.  HENT: Positive for congestion, postnasal drip and sore throat.   Respiratory: Positive for cough and wheezing. Negative for chest tightness and shortness of breath.   Cardiovascular: Positive for chest pain (substernal pain w/cough).  Musculoskeletal: Negative for back pain.  Neurological: Positive for headaches.       Objective:   Physical Exam  Vitals reviewed. Constitutional: He is oriented to person, place, and time. He appears well-developed and well-nourished. No distress.  HENT:  Head: Normocephalic and atraumatic.  Right Ear: External ear normal.  Left Ear: External ear normal.  Posterior pharynx mildly erythematous. Unable to visualize RTM due to ceruminosis.  Eyes: Conjunctivae are normal. Right eye exhibits no discharge. Left eye exhibits no discharge.  Neck: Normal range of motion. Neck supple. No thyromegaly present.  Cardiovascular: Normal rate, regular rhythm and normal heart sounds.   No murmur heard. Pulmonary/Chest: Effort normal. No respiratory distress.  Unable to thoroughly assess due to frequent coughing  Lymphadenopathy:    He has no cervical adenopathy.    Neurological: He is alert and oriented to person, place, and time.  Skin: Skin is warm and dry.  Psychiatric: He has a normal mood and affect. His behavior is normal. Thought content normal.          Assessment & Plan:  1. Acute bronchitis afebrile - chlorpheniramine-HYDROcodone (TUSSIONEX) 10-8 MG/5ML LQCR; Take 5 mLs by mouth at bedtime as needed for cough.  Dispense: 115 mL; Refill: 0 - albuterol (PROVENTIL HFA;VENTOLIN HFA) 108 (90 BASE) MCG/ACT inhaler; Inhale 2 puffs into the lungs 2 (two) times daily.  Dispense: 1 Inhaler; Refill: 0  See pt instructions.

## 2013-12-16 ENCOUNTER — Ambulatory Visit (INDEPENDENT_AMBULATORY_CARE_PROVIDER_SITE_OTHER): Payer: Commercial Managed Care - PPO | Admitting: Family Medicine

## 2013-12-16 ENCOUNTER — Ambulatory Visit (HOSPITAL_BASED_OUTPATIENT_CLINIC_OR_DEPARTMENT_OTHER)
Admission: RE | Admit: 2013-12-16 | Discharge: 2013-12-16 | Disposition: A | Discharge status: Home / Self Care | Payer: Commercial Managed Care - PPO | Source: Ambulatory Visit | Attending: Family Medicine | Admitting: Family Medicine

## 2013-12-16 ENCOUNTER — Encounter: Payer: Self-pay | Admitting: Family Medicine

## 2013-12-16 VITALS — BP 138/94 | HR 81 | Temp 97.4°F | Resp 18 | Ht 71.0 in | Wt 216.0 lb

## 2013-12-16 DIAGNOSIS — N200 Calculus of kidney: Secondary | ICD-10-CM

## 2013-12-16 DIAGNOSIS — R310 Gross hematuria: Secondary | ICD-10-CM | POA: Diagnosis present

## 2013-12-16 DIAGNOSIS — N183 Chronic kidney disease, stage 3 unspecified: Secondary | ICD-10-CM

## 2013-12-16 DIAGNOSIS — R300 Dysuria: Secondary | ICD-10-CM

## 2013-12-16 DIAGNOSIS — R109 Unspecified abdominal pain: Secondary | ICD-10-CM

## 2013-12-16 DIAGNOSIS — N1330 Unspecified hydronephrosis: Secondary | ICD-10-CM | POA: Diagnosis not present

## 2013-12-16 DIAGNOSIS — N202 Calculus of kidney with calculus of ureter: Secondary | ICD-10-CM | POA: Insufficient documentation

## 2013-12-16 DIAGNOSIS — K8020 Calculus of gallbladder without cholecystitis without obstruction: Secondary | ICD-10-CM | POA: Diagnosis not present

## 2013-12-16 LAB — CBC WITH DIFFERENTIAL/PLATELET
BASOS ABS: 0 10*3/uL (ref 0.0–0.1)
Basophils Relative: 0.3 % (ref 0.0–3.0)
EOS ABS: 0.2 10*3/uL (ref 0.0–0.7)
Eosinophils Relative: 1.8 % (ref 0.0–5.0)
HEMATOCRIT: 50.6 % (ref 39.0–52.0)
HEMOGLOBIN: 16.4 g/dL (ref 13.0–17.0)
LYMPHS ABS: 1.7 10*3/uL (ref 0.7–4.0)
Lymphocytes Relative: 17.7 % (ref 12.0–46.0)
MCHC: 32.4 g/dL (ref 30.0–36.0)
MCV: 90.1 fl (ref 78.0–100.0)
MONOS PCT: 8.2 % (ref 3.0–12.0)
Monocytes Absolute: 0.8 10*3/uL (ref 0.1–1.0)
NEUTROS ABS: 6.8 10*3/uL (ref 1.4–7.7)
Neutrophils Relative %: 72 % (ref 43.0–77.0)
Platelets: 223 10*3/uL (ref 150.0–400.0)
RBC: 5.62 Mil/uL (ref 4.22–5.81)
RDW: 13.4 % (ref 11.5–15.5)
WBC: 9.4 10*3/uL (ref 4.0–10.5)

## 2013-12-16 LAB — POCT URINALYSIS DIPSTICK
BILIRUBIN UA: NEGATIVE
Glucose, UA: NEGATIVE
KETONES UA: NEGATIVE
Leukocytes, UA: NEGATIVE
Nitrite, UA: NEGATIVE
PH UA: 5.5
Protein, UA: NEGATIVE
Spec Grav, UA: 1.02
Urobilinogen, UA: 0.2

## 2013-12-16 MED ORDER — OXYCODONE HCL 5 MG PO TABS: ORAL_TABLET | ORAL | Status: DC

## 2013-12-16 NOTE — Progress Notes (Addendum)
OFFICE NOTE  12/16/2013  CC:  Chief Complaint  Patient presents with  . Flank Pain    Left sided x 1 week   HPI: Patient is a 53 y.o. Caucasian male who is here for pain in left flank. Onset 1 week ago, left flank, waxing and waning, increased in severity this morning. The pain has remained in the same spot.  He points to left flank.  The pain does radiate towards the groin.  He does have decreased urine flow when the flank pain is present.  He noted gross hematuria last week but none today.  No fever, no nausea.  He had leftover oxycodone from prior illness and he took this last week.     Pertinent PMH:  Past medical, surgical, social, and family history reviewed and no changes are noted since last office visit.  MEDS:  Outpatient Prescriptions Prior to Visit  Medication Sig Dispense Refill  . atorvastatin (LIPITOR) 40 MG tablet Take 1 tablet (40 mg total) by mouth daily. 90 tablet 2  . lisinopril (PRINIVIL,ZESTRIL) 10 MG tablet Take 1 tablet (10 mg total) by mouth daily. 30 tablet 3  . albuterol (PROVENTIL HFA;VENTOLIN HFA) 108 (90 BASE) MCG/ACT inhaler Inhale 2 puffs into the lungs 2 (two) times daily. (Patient not taking: Reported on 12/16/2013) 1 Inhaler 0  . amLODipine (NORVASC) 10 MG tablet Take 1 tablet (10 mg total) by mouth daily. 90 tablet 2  . tadalafil (CIALIS) 10 MG tablet Take 1 tablet (10 mg total) by mouth daily as needed for erectile dysfunction. 10 tablet 0  . Testosterone (FORTESTA) 10 MG/ACT (2%) GEL Place 40 mg onto the skin daily. (2 pumps applied to each thigh once daily) (Patient not taking: Reported on 12/16/2013) 60 g 5  . benzonatate (TESSALON PERLES) 100 MG capsule Take 1 capsule (100 mg total) by mouth 3 (three) times daily as needed for cough. 20 capsule 0  . chlorpheniramine-HYDROcodone (TUSSIONEX) 10-8 MG/5ML LQCR Take 5 mLs by mouth at bedtime as needed for cough. 115 mL 0  . fluticasone (FLONASE) 50 MCG/ACT nasal spray      No facility-administered  medications prior to visit.    PE: Blood pressure 138/94, pulse 81, temperature 97.4 F (36.3 C), temperature source Temporal, resp. rate 18, height 5\' 11"  (1.803 m), weight 216 lb (97.977 kg), SpO2 96 %. Gen: Alert, well appearing.  Patient is oriented to person, place, time, and situation. UYQ:IHKV: no injection, icteris, swelling, or exudate.  EOMI, PERRLA. Mouth: lips without lesion/swelling.  Oral mucosa pink and moist. Oropharynx without erythema, exudate, or swelling.  CV: RRR, no m/r/g.   LUNGS: CTA bilat, nonlabored resps, good aeration in all lung fields. ABD: soft, NT/ND BACK: no CVA tenderness.  Left flank area has focal tenderness  LAB: CC UA today showed moderate blood  IMPRESSION AND PLAN:  Acute flank pain with gross hematuria, not resolving in a manner to suggest that a renal stone is passing appropriately. Will check noncontrast CT abd/pelv, CBC, Bmet. Oxycodone 5mg , 1-2 q6h prn pain, #40.  An After Visit Summary was printed and given to the patient.  FOLLOW UP: To be determined based on results of pending workup.   Addendum 12/18/13: CT renal stone study showed evidence of passage of stone recently on left side with possible left sided pyelo. No ureterolithiasis at this time.  Called pt and notified him, called in omnicef 300 mg bid x 10d.  Urine has been sent for c/s. Blood labs delayed due to lab  computer problems.--PM

## 2013-12-16 NOTE — Progress Notes (Signed)
Pre visit review using our clinic review tool, if applicable. No additional management support is needed unless otherwise documented below in the visit note. 

## 2013-12-17 ENCOUNTER — Other Ambulatory Visit: Payer: Self-pay | Admitting: Family Medicine

## 2013-12-17 MED ORDER — CEFDINIR 300 MG PO CAPS: 300.0000 mg | ORAL_CAPSULE | Freq: Two times a day (BID) | ORAL | Status: DC

## 2013-12-18 LAB — BASIC METABOLIC PANEL
BUN: 25 mg/dL — AB (ref 6–23)
CALCIUM: 10 mg/dL (ref 8.4–10.5)
CO2: 23 meq/L (ref 19–32)
CREATININE: 1.5 mg/dL (ref 0.4–1.5)
Chloride: 112 mEq/L (ref 96–112)
GFR: 61.16 mL/min (ref >60.00)
Glucose, Bld: 89 mg/dL (ref 70–99)
Potassium: 4.1 mEq/L (ref 3.5–5.1)
Sodium: 144 mEq/L (ref 135–145)

## 2013-12-18 LAB — URINE CULTURE
Colony Count: NO GROWTH
ORGANISM ID, BACTERIA: NO GROWTH

## 2013-12-21 ENCOUNTER — Encounter: Payer: Self-pay | Admitting: Family Medicine

## 2013-12-31 ENCOUNTER — Ambulatory Visit: Payer: Commercial Managed Care - PPO | Admitting: Family Medicine

## 2014-01-30 ENCOUNTER — Encounter: Payer: Self-pay | Admitting: Family Medicine

## 2015-05-25 ENCOUNTER — Ambulatory Visit
Admission: RE | Admit: 2015-05-25 | Discharge: 2015-05-25 | Disposition: A | Discharge status: Home / Self Care | Payer: Commercial Managed Care - PPO | Source: Ambulatory Visit | Attending: Family Medicine | Admitting: Family Medicine

## 2015-05-25 ENCOUNTER — Encounter: Payer: Self-pay | Admitting: Family Medicine

## 2015-05-25 ENCOUNTER — Ambulatory Visit (INDEPENDENT_AMBULATORY_CARE_PROVIDER_SITE_OTHER): Payer: Commercial Managed Care - PPO | Admitting: Family Medicine

## 2015-05-25 VITALS — BP 159/101 | HR 104 | Temp 97.6°F | Resp 20 | Wt 218.8 lb

## 2015-05-25 DIAGNOSIS — R109 Unspecified abdominal pain: Secondary | ICD-10-CM | POA: Diagnosis not present

## 2015-05-25 DIAGNOSIS — R10A2 Flank pain, left side: Secondary | ICD-10-CM | POA: Insufficient documentation

## 2015-05-25 LAB — URINALYSIS, ROUTINE W REFLEX MICROSCOPIC
BILIRUBIN URINE: NEGATIVE
KETONES UR: NEGATIVE
LEUKOCYTES UA: NEGATIVE
NITRITE: NEGATIVE
SPECIFIC GRAVITY, URINE: 1.02 (ref 1.000–1.030)
URINE GLUCOSE: NEGATIVE
UROBILINOGEN UA: 0.2 (ref 0.0–1.0)
pH: 5.5 (ref 5.0–8.0)

## 2015-05-25 LAB — BASIC METABOLIC PANEL
BUN: 27 mg/dL — ABNORMAL HIGH (ref 6–23)
CALCIUM: 10.1 mg/dL (ref 8.4–10.5)
CHLORIDE: 106 meq/L (ref 96–112)
CO2: 22 meq/L (ref 19–32)
Creatinine, Ser: 1.63 mg/dL — ABNORMAL HIGH (ref 0.40–1.50)
GFR: 56.97 mL/min — ABNORMAL LOW (ref >60.00)
GLUCOSE: 85 mg/dL (ref 70–99)
POTASSIUM: 4 meq/L (ref 3.5–5.1)
SODIUM: 140 meq/L (ref 135–145)

## 2015-05-25 LAB — CBC WITH DIFFERENTIAL/PLATELET
BASOS ABS: 0 10*3/uL (ref 0.0–0.1)
Basophils Relative: 0.3 % (ref 0.0–3.0)
EOS ABS: 0.1 10*3/uL (ref 0.0–0.7)
Eosinophils Relative: 1.1 % (ref 0.0–5.0)
HCT: 50.7 % (ref 39.0–52.0)
HEMOGLOBIN: 16.6 g/dL (ref 13.0–17.0)
LYMPHS ABS: 1.5 10*3/uL (ref 0.7–4.0)
Lymphocytes Relative: 13 % (ref 12.0–46.0)
MCHC: 32.8 g/dL (ref 30.0–36.0)
MCV: 89 fl (ref 78.0–100.0)
MONO ABS: 1.1 10*3/uL — AB (ref 0.1–1.0)
Monocytes Relative: 9.7 % (ref 3.0–12.0)
NEUTROS PCT: 75.9 % (ref 43.0–77.0)
Neutro Abs: 8.7 10*3/uL — ABNORMAL HIGH (ref 1.4–7.7)
Platelets: 219 10*3/uL (ref 150.0–400.0)
RBC: 5.7 Mil/uL (ref 4.22–5.81)
RDW: 14 % (ref 11.5–15.5)
WBC: 11.5 10*3/uL — AB (ref 4.0–10.5)

## 2015-05-25 MED ORDER — OXYCODONE HCL 5 MG PO TABS: ORAL_TABLET | ORAL | Status: DC

## 2015-05-25 MED ORDER — NAPROXEN 500 MG PO TABS: 500.0000 mg | ORAL_TABLET | Freq: Two times a day (BID) | ORAL | Status: DC

## 2015-05-25 NOTE — Progress Notes (Signed)
Patient ID: Patrick Campbell, male   DOB: 1960-10-30, 55 y.o.   MRN: [id number]    Patrick Campbell , 1960/01/19, 55 y.o., male MRN: [id number]  CC: Left flank pain Subjective: Patient presents for an acute office visit secondary to left flank pain of approximately 3 weeks' duration. Patient states originally the pain started out of the total ache in his left side after flight he took to [place]. He states the flight was approximately 20 hours. He initially thought that he had lifted something heavy or chest slept on it wrong during the plane, however his pain has only progressed over the last 3 weeks. He states that it is now a constant dull pain with intermittent sharp discomfort. He has had left over narcotic medication from the last time he had a kidney stone, and he has been attempting to take this without good resolution of his discomfort. He states last night his pain became even worse, so he made the appointment today. Patient denies any nausea, vomit, diarrhea, fever, chills, decreased urine output, changes in urine color or odor, dysuria, polydipsia, polyuria, changes in his urinary stream. He is eating and drinking well.  Electronically medical record reviewed appears he's had multiple kidney stones in bilateral kidneys over the last decade. He states he had follow up with urology once, but has not seen a urologist in years. He states that he never notices the passing of the actual stone, however he was able to catch the stone once in a strainer and he believes at that time when they send to lab it was made out of calcium. Patient denies taking oral calcium or high sodium/soda drinking. He does have a history of hypertension, however it does not appear he is been taking his medication since he hasn't been seen in over 2 years. His blood pressure is elevated in the office today. No Known Allergies Social History  Substance Use Topics  . Smoking status: Never Smoker   . Smokeless tobacco: Never Used  .  Alcohol Use: 1.8 - 2.4 oz/week    3-4 Glasses of wine per week     Comment: 2 glasses of wine 2 days a week   Past Medical History  Diagnosis Date  . Nephrolithiasis 04/1999 & 11/2003    w/right hydronephrosis on u/s 04/1999 & on CT 11/2003--resolved on 12/2013 CT.  Left hydronephrosis 12/2013 CT.  Mar[facility] Kitchen Acquired renal cyst of left kidney [id number]    Complicated cyst vs solid mass: contrast enhanced CT was recommended but ?apparently not done?  . Cholelithiasis 11/2003    Noted on noncontrast CT abd.  No cholecystitis.  . Hypogonadism male 02/2010  . Hypertension   . Chronic renal insufficiency, stage II (mild)     Borderline stage II/III (CrCl estimate @ low 60s)  . Polycythemia 05/10/2010    EPO level normal  . Hyperlipidemia    Past Surgical History  Procedure Laterality Date  . Knee surgery  2002  . Colonoscopy  09/30/11    Normal-repeat in 10 yrs   Family History  Problem Relation Age of Onset  . Hypertension Mother   . Cancer Father     lung/ smoker  . Kidney disease Brother   . HIV Brother      Medication List       This list is accurate as of: 05/25/15  9:08 AM.  Always use your most recent med list.               albuterol 108 (90  Base) MCG/ACT inhaler  Commonly known as:  PROVENTIL HFA;VENTOLIN HFA  Inhale 2 puffs into the lungs 2 (two) times daily.     amLODipine 10 MG tablet  Commonly known as:  NORVASC  Take 1 tablet (10 mg total) by mouth daily.     ANDROGEL PUMP 20.25 MG/ACT (1.62%) Gel  Generic drug:  Testosterone  APPLY 2 PUMPS DAILY AS DIRECTED     atorvastatin 40 MG tablet  Commonly known as:  LIPITOR  Take 1 tablet (40 mg total) by mouth daily.     lisinopril 10 MG tablet  Commonly known as:  PRINIVIL,ZESTRIL  Take 1 tablet (10 mg total) by mouth daily.     tadalafil 10 MG tablet  Commonly known as:  CIALIS  Take 1 tablet (10 mg total) by mouth daily as needed for erectile dysfunction.         ROS: Negative, with the exception of above  mentioned in HPI   Objective:  BP 159/101 mmHg  Pulse 104  Temp(Src) 97.6 F (36.4 C) (Oral)  Resp 20  Wt 218 lb 12 oz (99.224 kg)  SpO2 95% Body mass index is 30.52 kg/(m^2). Gen: Afebrile. No acute distress. Nontoxic in appearance, well-developed, well-nourished, African-American male. HENT: AT. [place]. Bilateral TM visualized and normal in appearance. MMM, no oral lesions.  Eyes:Pupils Equal Round Reactive to light, Extraocular movements intact,  Conjunctiva without redness, discharge or icterus. CV: Mild Tachycardia, regular rhythm Chest: CTAB, no wheeze or crackles. Good air movement, normal resp effort.  Abd: Soft. Round. ND. Mild tenderness to deep palpation left flank. BS present. No Masses palpated. No rebound or guarding. No hepatosplenomegaly. Mild to Moderate stool burden palpated. MSK: Negative CVA tenderness bilaterally  Neuro: Normal gait. PERLA. EOMi. Alert. Oriented x3   Assessment/Plan: Patrick Campbell is a 55 y.o. male present for acute OV for  1. Left flank pain - She present with acute left flank pain with history of kidney stones. Pain has been increasing over the past 3 weeks. Rule out infection/kidney damage with urinalysis and culture, CBC, BMP. CT renal study ordered for as soon as possible. Patient was encouraged take naproxen 500 mg twice a day to help with pain and anti-inflammatory property next 5-7 days with a meal. Oxycodone 5 mg was provided to patient today for moderate to severe pain only. - CBC w/Diff - Basic Metabolic Panel (BMET) - POCT Urinalysis Dipstick - Urinalysis, Routine w reflex microscopic - Urine Culture - CT RENAL STONE STUDY; Future - naproxen (NAPROSYN) 500 MG tablet; Take 1 tablet (500 mg total) by mouth 2 (two) times daily with a meal.  Dispense: 30 tablet; Refill: 0 - Follow-up in one week sooner if not improving or worsening.   Patient encouraged to make appointment with his primary care provider concerning his  hypertension.  electronically signed by:  Howard Pouch, DO  [place]

## 2015-05-25 NOTE — Patient Instructions (Signed)
Kidney Stones Kidney stones (urolithiasis) are deposits that form inside your kidneys. The intense pain is caused by the stone moving through the urinary tract. When the stone moves, the ureter goes into spasm around the stone. The stone is usually passed in the urine.  CAUSES   A disorder that makes certain neck glands produce too much parathyroid hormone (primary hyperparathyroidism).  A buildup of uric acid crystals, similar to gout in your joints.  Narrowing (stricture) of the ureter.  A kidney obstruction present at birth (congenital obstruction).  Previous surgery on the kidney or ureters.  Numerous kidney infections. SYMPTOMS   Feeling sick to your stomach (nauseous).  Throwing up (vomiting).  Blood in the urine (hematuria).  Pain that usually spreads (radiates) to the groin.  Frequency or urgency of urination. DIAGNOSIS   Taking a history and physical exam.  Blood or urine tests.  CT scan.  Occasionally, an examination of the inside of the urinary bladder (cystoscopy) is performed. TREATMENT   Observation.  Increasing your fluid intake.  Extracorporeal shock wave lithotripsy--This is a noninvasive procedure that uses shock waves to break up kidney stones.  Surgery may be needed if you have severe pain or persistent obstruction. There are various surgical procedures. Most of the procedures are performed with the use of small instruments. Only small incisions are needed to accommodate these instruments, so recovery time is minimized. The size, location, and chemical composition are all important variables that will determine the proper choice of action for you. Talk to your health care provider to better understand your situation so that you will minimize the risk of injury to yourself and your kidney.  HOME CARE INSTRUCTIONS   Drink enough water and fluids to keep your urine clear or pale yellow. This will help you to pass the stone or stone fragments.  Strain  all urine through the provided strainer. Keep all particulate matter and stones for your health care provider to see. The stone causing the pain may be as small as a grain of salt. It is very important to use the strainer each and every time you pass your urine. The collection of your stone will allow your health care provider to analyze it and verify that a stone has actually passed. The stone analysis will often identify what you can do to reduce the incidence of recurrences.  Only take over-the-counter or prescription medicines for pain, discomfort, or fever as directed by your health care provider.  Keep all follow-up visits as told by your health care provider. This is important.  Get follow-up X-rays if required. The absence of pain does not always mean that the stone has passed. It may have only stopped moving. If the urine remains completely obstructed, it can cause loss of kidney function or even complete destruction of the kidney. It is your responsibility to make sure X-rays and follow-ups are completed. Ultrasounds of the kidney can show blockages and the status of the kidney. Ultrasounds are not associated with any radiation and can be performed easily in a matter of minutes.  Make changes to your daily diet as told by your health care provider. You may be told to:  Limit the amount of salt that you eat.  Eat 5 or more servings of fruits and vegetables each day.  Limit the amount of meat, poultry, fish, and eggs that you eat.  Collect a 24-hour urine sample as told by your health care provider.You may need to collect another urine sample every 6-12   months. SEEK MEDICAL CARE IF:  You experience pain that is progressive and unresponsive to any pain medicine you have been prescribed. SEEK IMMEDIATE MEDICAL CARE IF:   Pain cannot be controlled with the prescribed medicine.  You have a fever or shaking chills.  The severity or intensity of pain increases over 18 hours and is not  relieved by pain medicine.  You develop a new onset of abdominal pain.  You feel faint or pass out.  You are unable to urinate.   This information is not intended to replace advice given to you by your health care provider. Make sure you discuss any questions you have with your health care provider.   Document Released: 12/31/2004 Document Revised: 09/21/2014 Document Reviewed: 06/03/2012 Elsevier Interactive Patient Education 2016 [employer]  Naproxen every 12 hours with  meal for 7 days.  narcotic medication for severe pain  CT schedule ASAP Strainer urine

## 2015-05-26 ENCOUNTER — Telehealth: Payer: Self-pay | Admitting: Family Medicine

## 2015-05-26 DIAGNOSIS — N201 Calculus of ureter: Secondary | ICD-10-CM

## 2015-05-26 DIAGNOSIS — N200 Calculus of kidney: Secondary | ICD-10-CM

## 2015-05-26 DIAGNOSIS — N1330 Unspecified hydronephrosis: Secondary | ICD-10-CM

## 2015-05-26 NOTE — Telephone Encounter (Addendum)
Pt was called to discuss renal CT:  - Pt is feeling 50 % better. No fever, chills, nausea or vomit. - Taking naproxen, hydrating and using narcotic for severe pain.  - Discussed CT and labs with pt today, as well as options. He had a urologist >10 years ago, but would like to establish with [facility] if possible.  - will see possibility of emergent referral vs ED and contact pt back today with plan.  - Discussed pt and plan with on call urologist Dr. Alinda Money, who felt pt should be seen Mon or Tues in the office, if worsening symptoms over the weekend pt needs ot go to ED. Pt is in understanding.   Ct Renal Stone Study  05/26/2015  CLINICAL DATA:  Left flank pain for 3 weeks EXAM: CT ABDOMEN AND PELVIS WITHOUT CONTRAST TECHNIQUE: Multidetector CT imaging of the abdomen and pelvis was performed following the standard protocol without oral or intravenous contrast material administration. COMPARISON:  December 16, 2013 FINDINGS: Lower chest: There is mild bibasilar lung atelectasis. Lung bases otherwise are clear. There is calcification in an inferior right hilar lymph node. Hepatobiliary: No focal liver lesions are identified on this noncontrast enhanced study. There is a gallstone within the gallbladder. The gallbladder wall is not appreciably thickened. There is no biliary duct dilatation. Pancreas: No pancreatic mass or inflammatory focus. Spleen: No splenic lesions are evident. Adrenals/Urinary Tract: Upper adrenals appear normal bilaterally. There is no hydronephrosis on the right. There is moderate hydronephrosis on the left. There is a 1.4 x 1.0 cm cyst in the anterior mid right kidney. There is moderate perinephric stranding on the left with left renal edema. There is no renal calculus or ureteral calculus on the right. On the left, there is a calculus within a posterior calyx in the lower pole region measuring 10 x 8 mm. Three there is a somewhat amorphous calculus in the periphery of the lower  pole of the left kidney measuring 9 x 6 mm. There is a 1 mm calculus with a nearby 5 x 2 mm calculus posterior mid upper pole left kidney. There is a calculus just beyond the ureteropelvic junction in the proximal left ureter measuring 7 x 6 mm. There is no other ureteral calculus. Urinary bladder is midline with wall thickness within normal limits. Stomach/Bowel: There is no bowel wall or mesenteric thickening. No bowel obstruction. No free air or portal venous air. Vascular/Lymphatic: There is no abdominal aortic aneurysm. There is no vascular lesions beyond minimal calcification in the distal aorta. There is no adenopathy in the abdomen or pelvis. Reproductive: Prostate and seminal vesicles appear normal in size and contour. There is no pelvic mass or pelvic fluid collection. Other: There is no periappendiceal region inflammatory change. No ascites or abscess is apparent in the abdomen or pelvis. Musculoskeletal: There are no blastic or lytic bone lesions. There is no intramuscular or abdominal wall lesion. IMPRESSION: 7 x 6 mm calculus just beyond the ureteropelvic junction the proximal left ureter. Severe hydronephrosis on the left with left renal edema. There are intrarenal calculi on the left. Cholelithiasis.  No gallbladder wall thickening. No bowel obstruction.  No abscess. These results will be called to the ordering clinician or representative by the Radiologist Assistant, and communication documented in the PACS or zVision Dashboard. Electronically Signed   By: Lowella Grip III M.D.   On: 05/26/2015 09:16   Results for orders placed or performed in visit on 05/25/15 (from the past 72 hour(s))  CBC w/Diff     Status: Abnormal   Collection Time: 05/25/15  9:31 AM  Result Value Ref Range   WBC 11.5 (H) 4.0 - 10.5 K/uL   RBC 5.70 4.22 - 5.81 Mil/uL   Hemoglobin 16.6 13.0 - 17.0 g/dL   HCT 50.7 39.0 - 52.0 %   MCV 89.0 78.0 - 100.0 fl   MCHC 32.8 30.0 - 36.0 g/dL   RDW 14.0 11.5 - 15.5 %    Platelets 219.0 150.0 - 400.0 K/uL   Neutrophils Relative % 75.9 43.0 - 77.0 %   Lymphocytes Relative 13.0 12.0 - 46.0 %   Monocytes Relative 9.7 3.0 - 12.0 %   Eosinophils Relative 1.1 0.0 - 5.0 %   Basophils Relative 0.3 0.0 - 3.0 %   Neutro Abs 8.7 (H) 1.4 - 7.7 K/uL   Lymphs Abs 1.5 0.7 - 4.0 K/uL   Monocytes Absolute 1.1 (H) 0.1 - 1.0 K/uL   Eosinophils Absolute 0.1 0.0 - 0.7 K/uL   Basophils Absolute 0.0 0.0 - 0.1 K/uL  Basic Metabolic Panel (BMET)     Status: Abnormal   Collection Time: 05/25/15  9:31 AM  Result Value Ref Range   Sodium 140 135 - 145 mEq/L   Potassium 4.0 3.5 - 5.1 mEq/L   Chloride 106 96 - 112 mEq/L   CO2 22 19 - 32 mEq/L   Glucose, Bld 85 70 - 99 mg/dL   BUN 27 (H) 6 - 23 mg/dL   Creatinine, Ser 1.63 (H) 0.40 - 1.50 mg/dL   Calcium 10.1 8.4 - 10.5 mg/dL   GFR 56.97 (L) >60.00 mL/min  Urinalysis, Routine w reflex microscopic     Status: Abnormal   Collection Time: 05/25/15  9:31 AM  Result Value Ref Range   Color, Urine YELLOW Yellow;Lt. Yellow   APPearance CLEAR Clear   Specific Gravity, Urine 1.020 1.000-1.030   pH 5.5 5.0 - 8.0   Total Protein, Urine TRACE (A) Negative   Urine Glucose NEGATIVE Negative   Ketones, ur NEGATIVE Negative   Bilirubin Urine NEGATIVE Negative   Hgb urine dipstick SMALL (A) Negative   Urobilinogen, UA 0.2 0.0 - 1.0   Leukocytes, UA NEGATIVE Negative   Nitrite NEGATIVE Negative   WBC, UA 0-2/hpf 0-2/hpf   RBC / HPF 3-6/hpf (A) 0-2/hpf   Mucus, UA Presence of (A) None   Bacteria, UA Rare(<10/hpf) (A) None

## 2015-05-27 LAB — URINE CULTURE
COLONY COUNT: NO GROWTH
Organism ID, Bacteria: NO GROWTH

## 2015-05-29 ENCOUNTER — Encounter (HOSPITAL_COMMUNITY): Payer: Self-pay | Admitting: *Deleted

## 2015-05-29 ENCOUNTER — Encounter (HOSPITAL_COMMUNITY)
Admission: AD | Disposition: A | Discharge status: Home / Self Care | Payer: Self-pay | Source: Ambulatory Visit | Attending: Urology

## 2015-05-29 ENCOUNTER — Ambulatory Visit (HOSPITAL_COMMUNITY): Payer: Commercial Managed Care - PPO | Admitting: Anesthesiology

## 2015-05-29 ENCOUNTER — Other Ambulatory Visit: Payer: Self-pay | Admitting: Urology

## 2015-05-29 ENCOUNTER — Ambulatory Visit (HOSPITAL_COMMUNITY)
Admission: AD | Admit: 2015-05-29 | Discharge: 2015-05-29 | Disposition: A | Discharge status: Home / Self Care | Payer: Commercial Managed Care - PPO | Source: Ambulatory Visit | Attending: Urology | Admitting: Urology

## 2015-05-29 DIAGNOSIS — Z79891 Long term (current) use of opiate analgesic: Secondary | ICD-10-CM | POA: Insufficient documentation

## 2015-05-29 DIAGNOSIS — Z87442 Personal history of urinary calculi: Secondary | ICD-10-CM | POA: Insufficient documentation

## 2015-05-29 DIAGNOSIS — N132 Hydronephrosis with renal and ureteral calculous obstruction: Secondary | ICD-10-CM | POA: Insufficient documentation

## 2015-05-29 DIAGNOSIS — E785 Hyperlipidemia, unspecified: Secondary | ICD-10-CM | POA: Diagnosis not present

## 2015-05-29 DIAGNOSIS — N182 Chronic kidney disease, stage 2 (mild): Secondary | ICD-10-CM | POA: Diagnosis not present

## 2015-05-29 DIAGNOSIS — I129 Hypertensive chronic kidney disease with stage 1 through stage 4 chronic kidney disease, or unspecified chronic kidney disease: Secondary | ICD-10-CM | POA: Diagnosis not present

## 2015-05-29 DIAGNOSIS — Z79899 Other long term (current) drug therapy: Secondary | ICD-10-CM | POA: Diagnosis not present

## 2015-05-29 DIAGNOSIS — Z791 Long term (current) use of non-steroidal anti-inflammatories (NSAID): Secondary | ICD-10-CM | POA: Diagnosis not present

## 2015-05-29 DIAGNOSIS — R109 Unspecified abdominal pain: Secondary | ICD-10-CM | POA: Diagnosis present

## 2015-05-29 HISTORY — PX: CYSTOSCOPY WITH STENT PLACEMENT: SHX5790

## 2015-05-29 SURGERY — CYSTOSCOPY, WITH STENT INSERTION
Anesthesia: General | Site: Ureter | Laterality: Left

## 2015-05-29 MED ORDER — MIDAZOLAM HCL 2 MG/2ML IJ SOLN
INTRAMUSCULAR | Status: AC
  Filled 2015-05-29: qty 2

## 2015-05-29 MED ORDER — LACTATED RINGERS IV SOLN
INTRAVENOUS | Status: DC
  Administered 2015-05-29: 16:00:00 via INTRAVENOUS

## 2015-05-29 MED ORDER — SODIUM CHLORIDE 0.9 % IR SOLN
Status: DC | PRN
  Administered 2015-05-29: 3000 mL

## 2015-05-29 MED ORDER — CEFAZOLIN SODIUM-DEXTROSE 2-4 GM/100ML-% IV SOLN
2.0000 g | INTRAVENOUS | Status: AC
  Administered 2015-05-29: 2 g via INTRAVENOUS
  Filled 2015-05-29: qty 100

## 2015-05-29 MED ORDER — PROPOFOL 10 MG/ML IV BOLUS
INTRAVENOUS | Status: AC
  Filled 2015-05-29: qty 20

## 2015-05-29 MED ORDER — SUCCINYLCHOLINE CHLORIDE 20 MG/ML IJ SOLN
INTRAMUSCULAR | Status: DC | PRN
  Administered 2015-05-29: 100 mg via INTRAVENOUS

## 2015-05-29 MED ORDER — CEFAZOLIN SODIUM-DEXTROSE 2-4 GM/100ML-% IV SOLN
INTRAVENOUS | Status: AC
  Filled 2015-05-29: qty 100

## 2015-05-29 MED ORDER — FENTANYL CITRATE (PF) 100 MCG/2ML IJ SOLN
INTRAMUSCULAR | Status: DC | PRN
  Administered 2015-05-29: 50 ug via INTRAVENOUS
  Administered 2015-05-29: 100 ug via INTRAVENOUS

## 2015-05-29 MED ORDER — ROCURONIUM BROMIDE 100 MG/10ML IV SOLN
INTRAVENOUS | Status: DC | PRN
  Administered 2015-05-29: 30 mg via INTRAVENOUS

## 2015-05-29 MED ORDER — FENTANYL CITRATE (PF) 100 MCG/2ML IJ SOLN
INTRAMUSCULAR | Status: AC
  Filled 2015-05-29: qty 2

## 2015-05-29 MED ORDER — SUGAMMADEX SODIUM 200 MG/2ML IV SOLN
INTRAVENOUS | Status: DC | PRN
  Administered 2015-05-29: 200 mg via INTRAVENOUS

## 2015-05-29 MED ORDER — MIDAZOLAM HCL 5 MG/5ML IJ SOLN
INTRAMUSCULAR | Status: DC | PRN
  Administered 2015-05-29: 2 mg via INTRAVENOUS

## 2015-05-29 MED ORDER — OXYCODONE-ACETAMINOPHEN 5-325 MG PO TABS
1.0000 | ORAL_TABLET | ORAL | Status: DC | PRN
  Administered 2015-05-29: 1 via ORAL
  Filled 2015-05-29: qty 1

## 2015-05-29 MED ORDER — PROPOFOL 10 MG/ML IV BOLUS
INTRAVENOUS | Status: DC | PRN
  Administered 2015-05-29: 200 mg via INTRAVENOUS

## 2015-05-29 MED ORDER — FENTANYL CITRATE (PF) 100 MCG/2ML IJ SOLN: 25.0000 ug | INTRAMUSCULAR | Status: DC | PRN

## 2015-05-29 MED ORDER — ONDANSETRON HCL 4 MG/2ML IJ SOLN
INTRAMUSCULAR | Status: DC | PRN
  Administered 2015-05-29: 4 mg via INTRAVENOUS

## 2015-05-29 MED ORDER — DIATRIZOATE MEGLUMINE 30 % UR SOLN
URETHRAL | Status: AC
  Filled 2015-05-29: qty 100

## 2015-05-29 MED ORDER — ONDANSETRON HCL 4 MG/2ML IJ SOLN
4.0000 mg | Freq: Once | INTRAMUSCULAR | Status: AC | PRN
  Administered 2015-05-29: 4 mg via INTRAVENOUS
  Filled 2015-05-29: qty 2

## 2015-05-29 MED ORDER — DIATRIZOATE MEGLUMINE 30 % UR SOLN
URETHRAL | Status: DC | PRN
  Administered 2015-05-29: 15 mL via URETHRAL

## 2015-05-29 SURGICAL SUPPLY — 10 items
BAG URO CATCHER STRL LF (MISCELLANEOUS) ×2 IMPLANT
CATH INTERMIT  6FR 70CM (CATHETERS) ×2 IMPLANT
CLOTH BEACON ORANGE TIMEOUT ST (SAFETY) ×2 IMPLANT
GLOVE BIOGEL M 8.0 STRL (GLOVE) ×4 IMPLANT
GOWN STRL REUS W/TWL LRG LVL3 (GOWN DISPOSABLE) ×4 IMPLANT
GUIDEWIRE STR DUAL SENSOR (WIRE) ×2 IMPLANT
MANIFOLD NEPTUNE II (INSTRUMENTS) ×2 IMPLANT
PACK CYSTO (CUSTOM PROCEDURE TRAY) ×2 IMPLANT
STENT CONTOUR 6FRX26X.038 (STENTS) ×1 IMPLANT
TUBING CONNECTING 10 (TUBING) ×2 IMPLANT

## 2015-05-29 NOTE — Anesthesia Postprocedure Evaluation (Signed)
Anesthesia Post Note  Patient: Patrick Campbell  Procedure(s) Performed: Procedure(s) (LRB): CYSTOSCOPY WITH STENT PLACEMENT, RETROGRADE (Left)  Patient location during evaluation: PACU Anesthesia Type: General Level of consciousness: awake and alert Pain management: pain level controlled Vital Signs Assessment: post-procedure vital signs reviewed and stable Respiratory status: spontaneous breathing, nonlabored ventilation, respiratory function stable and patient connected to nasal cannula oxygen Cardiovascular status: blood pressure returned to baseline and stable Postop Assessment: no signs of nausea or vomiting Anesthetic complications: no    Last Vitals:  Filed Vitals:   05/29/15 1730 05/29/15 1745  BP: 154/86 140/86  Pulse: 72 75  Temp:  36.3 C  Resp: 11 19    Last Pain:  Filed Vitals:   05/29/15 1749  PainSc: 0-No pain                 Brynlea Spindler JENNETTE

## 2015-05-29 NOTE — Brief Op Note (Signed)
05/29/2015  4:56 PM  PATIENT:  Patrick Campbell  55 y.o. male  PRE-OPERATIVE DIAGNOSIS:  left ureteral calculus  POST-OPERATIVE DIAGNOSIS:  left ureteral calculi  PROCEDURE:  Procedure(s): CYSTOSCOPY WITH STENT PLACEMENT, RETROGRADE (Left)  SURGEON:  Surgeon(s) and Role:    * Cleon Gustin, MD - Primary  PHYSICIAN ASSISTANT:   ASSISTANTS: none   ANESTHESIA:   general  EBL:   none  BLOOD ADMINISTERED:none  DRAINS: Left 6x26 JJ ureteral stent without tether   LOCAL MEDICATIONS USED:  NONE  SPECIMEN:  No Specimen  DISPOSITION OF SPECIMEN:  N/A  COUNTS:  YES  TOURNIQUET:  * No tourniquets in log *  DICTATION: .Note written in EPIC  PLAN OF CARE: Discharge to home after PACU  PATIENT DISPOSITION:  PACU - hemodynamically stable.   Delay start of Pharmacological VTE agent (>24hrs) due to surgical blood loss or risk of bleeding: not applicable

## 2015-05-29 NOTE — Anesthesia Procedure Notes (Signed)
Procedure Name: Intubation Date/Time: 05/29/2015 4:35 PM Performed by: Anne Fu Pre-anesthesia Checklist: Patient identified, Emergency Drugs available, Suction available, Patient being monitored and Timeout performed Patient Re-evaluated:Patient Re-evaluated prior to inductionOxygen Delivery Method: Circle system utilized Preoxygenation: Pre-oxygenation with 100% oxygen Intubation Type: IV induction Ventilation: Mask ventilation without difficulty Laryngoscope Size: Mac and 4 Grade View: Grade III Tube type: Oral Tube size: 7.5 mm Number of attempts: 1 Airway Equipment and Method: Stylet Placement Confirmation: ETT inserted through vocal cords under direct vision,  positive ETCO2,  CO2 detector and breath sounds checked- equal and bilateral Secured at: 26 cm Tube secured with: Tape Dental Injury: Teeth and Oropharynx as per pre-operative assessment  Difficulty Due To: Difficult Airway- due to large tongue and Difficult Airway- due to reduced neck mobility Future Recommendations: Recommend- induction with short-acting agent, and alternative techniques readily available

## 2015-05-29 NOTE — Discharge Instructions (Signed)
General Anesthesia, Adult, Care After Refer to this sheet in the next few weeks. These instructions provide you with information on caring for yourself after your procedure. Your health care provider may also give you more specific instructions. Your treatment has been planned according to current medical practices, but problems sometimes occur. Call your health care provider if you have any problems or questions after your procedure. WHAT TO EXPECT AFTER THE PROCEDURE After the procedure, it is typical to experience: Sleepiness. Nausea and vomiting. HOME CARE INSTRUCTIONS For the first 24 hours after general anesthesia: Have a responsible person with you. Do not drive a car. If you are alone, do not take public transportation. Do not drink alcohol. Do not take medicine that has not been prescribed by your health care provider. Do not sign important papers or make important decisions. You may resume a normal diet and activities as directed by your health care provider. Change bandages (dressings) as directed. If you have questions or problems that seem related to general anesthesia, call the hospital and ask for the anesthetist or anesthesiologist on call. SEEK MEDICAL CARE IF: You have nausea and vomiting that continue the day after anesthesia. You develop a rash. SEEK IMMEDIATE MEDICAL CARE IF:  You have difficulty breathing. You have chest pain. You have any allergic problems.   This information is not intended to replace advice given to you by your health care provider. Make sure you discuss any questions you have with your health care provider.   Document Released: 04/08/2000 Document Revised: 01/21/2014 Document Reviewed: 05/01/2011 Elsevier Interactive Patient Education 2016 Elsevier Inc. Ureteral Stent Implantation, Care After Refer to this sheet in the next few weeks. These instructions provide you with information on caring for yourself after your procedure. Your health  care provider may also give you more specific instructions. Your treatment has been planned according to current medical practices, but problems sometimes occur. Call your health care provider if you have any problems or questions after your procedure. WHAT TO EXPECT AFTER THE PROCEDURE You should be back to normal activity within 48 hours after the procedure. Nausea and vomiting may occur and are commonly the result of anesthesia. It is common to experience sharp pain in the back or lower abdomen and penis with voiding. This is caused by movement of the ends of the stent with the act of urinating.It usually goes away within minutes after you have stopped urinating. HOME CARE INSTRUCTIONS Make sure to drink plenty of fluids. You may have small amounts of bleeding, causing your urine to be red. This is normal. Certain movements may trigger pain or a feeling that you need to urinate. You may be given medicines to prevent infection or bladder spasms. Be sure to take all medicines as directed. Only take over-the-counter or prescription medicines for pain, discomfort, or fever as directed by your health care provider. Do not take aspirin, as this can make bleeding worse. Your stent will be left in until the blockage is resolved. This may take 2 weeks or longer, depending on the reason for stent implantation. You may have an X-ray exam to make sure your ureter is open and that the stent has not moved out of position (migrated). The stent can be removed by your health care provider in the office. Medicines may be given for comfort while the stent is being removed. Be sure to keep all follow-up appointments so your health care provider can check that you are healing properly. [place]  IF:  You experience increasing pain.  Your pain medicine is not working. SEEK IMMEDIATE MEDICAL CARE IF:  Your urine is dark red or has blood clots.  You are leaking urine (incontinent).  You have a fever, chills,  feeling sick to your stomach (nausea), or vomiting.  Your pain is not relieved by pain medicine.  The end of the stent comes out of the urethra.  You are unable to urinate.   This information is not intended to replace advice given to you by your health care provider. Make sure you discuss any questions you have with your health care provider.   Document Released: 09/02/2012 Document Revised: 01/05/2013 Document Reviewed: 07/15/2014 Elsevier Interactive Patient Education [employer].

## 2015-05-29 NOTE — Anesthesia Preprocedure Evaluation (Addendum)
Anesthesia Evaluation  Patient identified by MRN, date of birth, ID band Patient awake    Reviewed: Allergy & Precautions, NPO status , Patient's Chart, lab work & pertinent test results  History of Anesthesia Complications Negative for: history of anesthetic complications  Airway Mallampati: II  TM Distance: >3 FB Neck ROM: Full    Dental no notable dental hx. (+) Dental Advisory Given   Pulmonary neg pulmonary ROS,    Pulmonary exam normal breath sounds clear to auscultation       Cardiovascular hypertension, Pt. on medications Normal cardiovascular exam Rhythm:Regular Rate:Normal     Neuro/Psych  Headaches, negative psych ROS   GI/Hepatic negative GI ROS, Neg liver ROS,   Endo/Other  obesity  Renal/GU Renal disease  negative genitourinary   Musculoskeletal negative musculoskeletal ROS (+)   Abdominal   Peds negative pediatric ROS (+)  Hematology negative hematology ROS (+)   Anesthesia Other Findings   Reproductive/Obstetrics negative OB ROS                            Anesthesia Physical Anesthesia Plan  ASA: II  Anesthesia Plan: General   Post-op Pain Management:    Induction: Intravenous  Airway Management Planned: Oral ETT  Additional Equipment:   Intra-op Plan:   Post-operative Plan: Extubation in OR  Informed Consent: I have reviewed the patients History and Physical, chart, labs and discussed the procedure including the risks, benefits and alternatives for the proposed anesthesia with the patient or authorized representative who has indicated his/her understanding and acceptance.   Dental advisory given  Plan Discussed with: CRNA  Anesthesia Plan Comments:        Anesthesia Quick Evaluation

## 2015-05-29 NOTE — Op Note (Signed)
.  Preoperative diagnosis: Left ureteral stone  Postoperative diagnosis: Same  Procedure: 1 cystoscopy 2. Left retrograde pyelography 3.  Intraoperative fluoroscopy, under one hour, with interpretation 4. Left 6 x 26 JJ stent placement  Attending: Nicolette Bang  Anesthesia: General  Estimated blood loss: None  Drains: Left 6 x 26 JJ ureteral stent without tether  Specimens: none  Antibiotics: ancef  Findings: left proximal ureteral stone. Moderate hydronephrosis. No masses/lesions in the bladder. Ureteral orifices in normal anatomic location.  Indications: Patient is a 55 year old male with a history of left ureteral stone and intractable pain.  After discussing treatment options, they decided proceed with left stent placement.  Procedure her in detail: The patient was brought to the operating room and a brief timeout was done to ensure correct patient, correct procedure, correct site.  General anesthesia was administered patient was placed in dorsal lithotomy position.  Their genitalia was then prepped and draped in usual sterile fashion.  A rigid 86 French cystoscope was passed in the urethra and the bladder.  Bladder was inspected free masses or lesions.  the ureteral orifices were in the normal orthotopic locations.  a 6 french ureteral catheter was then instilled into the left ureteral orifice.  a gentle retrograde was obtained and findings noted above.  we then placed a zip wire through the ureteral catheter and advanced up to the renal pelvis.    We then placed a 6 x 26 double-j ureteral stent over the original zip wire.  We then removed the wire and good coil was noted in the the renal pelvis under fluoroscopy and the bladder under direct vision.   the bladder was then drained and this concluded the procedure which was well tolerated by patient.  Complications: None  Condition: Stable, extubated, transferred to PACU  Plan: Patient is to be discharged home. He will have his  stone extraction in 2 weeks.

## 2015-05-29 NOTE — H&P (Signed)
Urology Admission H&P  Chief Complaint: left flank pain  History of Present Illness: Patrick Campbell is a 55yo with ahx of nephrolithiasis who presented to my office with a 3 week hx of progressively worsening left flank pain. He underwent CT scan which showed a 63mm proximal left ureteral calculus. He has associated nausea. No LUTS  Past Medical History  Diagnosis Date  . Nephrolithiasis 04/1999 & 11/2003    w/right hydronephrosis on u/s 04/1999 & on CT 11/2003--resolved on 12/2013 CT.  Left hydronephrosis 12/2013 CT.  Marland Kitchen Acquired renal cyst of left kidney [id number]    Complicated cyst vs solid mass: contrast enhanced CT was recommended but ?apparently not done?  . Cholelithiasis 11/2003    Noted on noncontrast CT abd.  No cholecystitis.  . Hypogonadism male 02/2010  . Hypertension   . Chronic renal insufficiency, stage II (mild)     Borderline stage II/III (CrCl estimate @ low 60s)  . Polycythemia 05/10/2010    EPO level normal  . Hyperlipidemia    Past Surgical History  Procedure Laterality Date  . Knee surgery  2002  . Colonoscopy  09/30/11    Normal-repeat in 10 yrs    Home Medications:  Prescriptions prior to admission  Medication Sig Dispense Refill Last Dose  . amLODipine (NORVASC) 10 MG tablet Take 1 tablet (10 mg total) by mouth daily. 90 tablet 2 05/28/2015 at Unknown time  . ANDROGEL PUMP 20.25 MG/ACT (1.62%) GEL APPLY 2 PUMPS DAILY AS DIRECTED  2 05/27/2015  . atorvastatin (LIPITOR) 40 MG tablet Take 1 tablet (40 mg total) by mouth daily. 90 tablet 2 05/28/2015 at Unknown time  . diazepam (VALIUM) 5 MG tablet Take 5 mg by mouth every 8 (eight) hours as needed for muscle spasms (kidney stone spasms).     Marland Kitchen ibuprofen (ADVIL,MOTRIN) 200 MG tablet Take 400 mg by mouth every 6 (six) hours as needed for fever, headache, mild pain, moderate pain or cramping.   05/28/2015 at Unknown time  . oxybutynin (DITROPAN) 5 MG tablet Take 5 mg by mouth every 8 (eight) hours as needed for bladder  spasms.     Marland Kitchen oxyCODONE-acetaminophen (PERCOCET/ROXICET) 5-325 MG tablet Take 1 tablet by mouth every 4 (four) hours as needed for moderate pain or severe pain.     . phenazopyridine (PYRIDIUM) 100 MG tablet Take 100 mg by mouth 3 (three) times daily as needed for pain.     . tadalafil (CIALIS) 10 MG tablet Take 1 tablet (10 mg total) by mouth daily as needed for erectile dysfunction. 10 tablet 0 unknown  . tamsulosin (FLOMAX) 0.4 MG CAPS capsule Take 0.4 mg by mouth at bedtime.     Marland Kitchen albuterol (PROVENTIL HFA;VENTOLIN HFA) 108 (90 BASE) MCG/ACT inhaler Inhale 2 puffs into the lungs 2 (two) times daily. (Patient not taking: Reported on 05/29/2015) 1 Inhaler 0 Not Taking at Unknown time  . lisinopril (PRINIVIL,ZESTRIL) 10 MG tablet Take 1 tablet (10 mg total) by mouth daily. (Patient not taking: Reported on 05/29/2015) 30 tablet 3 Not Taking at Unknown time  . naproxen (NAPROSYN) 500 MG tablet Take 1 tablet (500 mg total) by mouth 2 (two) times daily with a meal. (Patient not taking: Reported on 05/29/2015) 30 tablet 0 Not Taking at Unknown time  . oxyCODONE (OXY IR/ROXICODONE) 5 MG immediate release tablet 1-2 tabs po q6h prn pain (Patient not taking: Reported on 05/29/2015) 40 tablet 0 Not Taking at Unknown time   Allergies: No Known Allergies  Family History  Problem Relation Age of Onset  . Hypertension Mother   . Cancer Father     lung/ smoker  . Kidney disease Brother   . HIV Brother    Social History:  reports that he has never smoked. He has never used smokeless tobacco. He reports that he drinks about 1.8 - 2.4 oz of alcohol per week. He reports that he does not use illicit drugs.  Review of Systems  Genitourinary: Positive for flank pain.  All other systems reviewed and are negative.   Physical Exam:  Vital signs in last 24 hours: Temp:  [97.7 F (36.5 C)] 97.7 F (36.5 C) (05/15 1459) Pulse Rate:  [71] 71 (05/15 1459) Resp:  [18] 18 (05/15 1459) BP: (150)/(85) 150/85 mmHg  (05/15 1459) SpO2:  [96 %] 96 % (05/15 1459) Weight:  [98.793 kg (217 lb 12.8 oz)] 98.793 kg (217 lb 12.8 oz) (05/15 1459) Physical Exam  Constitutional: He is oriented to person, place, and time. He appears well-developed and well-nourished.  HENT:  Head: Normocephalic and atraumatic.  Eyes: EOM are normal. Pupils are equal, round, and reactive to light.  Neck: Normal range of motion. No thyromegaly present.  Cardiovascular: Normal rate and regular rhythm.   Respiratory: Effort normal. No respiratory distress.  GI: Soft. He exhibits no distension.  Musculoskeletal: Normal range of motion.  Neurological: He is alert and oriented to person, place, and time.  Skin: Skin is warm and dry.  Psychiatric: He has a normal mood and affect. His behavior is normal. Judgment and thought content normal.    Laboratory Data:  No results found for this or any previous visit (from the past 24 hour(s)). Recent Results (from the past 240 hour(s))  Urine Culture     Status: None   Collection Time: 05/25/15  9:31 AM  Result Value Ref Range Status   Colony Count NO GROWTH  Final   Organism ID, Bacteria NO GROWTH  Final   Creatinine:  Recent Labs  05/25/15 0931  CREATININE 1.63*   Baseline Creatinine: unknwon  Impression/Assessment:  55yo with left ureteral calculus and intractable pain  Plan:  The risks/benefits/alternatives to left ureteral stent placement was explained to the patient and he understands and wishes to proceed with surgery  Nicolette Bang 05/29/2015, 4:21 PM

## 2015-05-29 NOTE — Transfer of Care (Signed)
Immediate Anesthesia Transfer of Care Note  Patient: Patrick Campbell  Procedure(s) Performed: Procedure(s): CYSTOSCOPY WITH STENT PLACEMENT, RETROGRADE (Left)  Patient Location: PACU  Anesthesia Type:General  Level of Consciousness:  sedated, patient cooperative and responds to stimulation  Airway & Oxygen Therapy:Patient Spontanous Breathing and Patient connected to face mask oxgen  Post-op Assessment:  Report given to PACU RN and Post -op Vital signs reviewed and stable  Post vital signs:  Reviewed and stable  Last Vitals:  Filed Vitals:   05/29/15 1459  BP: 150/85  Pulse: 71  Temp: 36.5 C  Resp: 18    Complications: No apparent anesthesia complications

## 2015-05-30 ENCOUNTER — Encounter (HOSPITAL_COMMUNITY): Payer: Self-pay | Admitting: Urology

## 2015-06-01 ENCOUNTER — Other Ambulatory Visit: Payer: Self-pay | Admitting: Urology

## 2015-06-05 ENCOUNTER — Encounter: Payer: Self-pay | Admitting: Family Medicine

## 2015-06-08 ENCOUNTER — Encounter (HOSPITAL_BASED_OUTPATIENT_CLINIC_OR_DEPARTMENT_OTHER): Payer: Self-pay | Admitting: *Deleted

## 2015-06-08 NOTE — Progress Notes (Signed)
NPO AFTER MN.  ARRIVE AT 1100. CURRENT LAB RESULTS AND EKG IN CHART AND EPIC.  MAY TAKE PAIN RX IF NEEDED AM DOS .

## 2015-06-14 ENCOUNTER — Telehealth: Payer: Self-pay | Admitting: *Deleted

## 2015-06-14 NOTE — Telephone Encounter (Signed)
Fax from [facility] requesting refill. Medication not on pt medication list.   RF request for Naproxen LOV: 05/25/15 Next ov: None Last written: 05/25/15 #30 w/ 0RF  Please advise. Thanks.

## 2015-06-15 ENCOUNTER — Ambulatory Visit (HOSPITAL_BASED_OUTPATIENT_CLINIC_OR_DEPARTMENT_OTHER): Payer: Commercial Managed Care - PPO | Admitting: Anesthesiology

## 2015-06-15 ENCOUNTER — Encounter (HOSPITAL_BASED_OUTPATIENT_CLINIC_OR_DEPARTMENT_OTHER): Payer: Self-pay | Admitting: *Deleted

## 2015-06-15 ENCOUNTER — Encounter (HOSPITAL_BASED_OUTPATIENT_CLINIC_OR_DEPARTMENT_OTHER)
Admission: RE | Disposition: A | Discharge status: Home / Self Care | Payer: Self-pay | Source: Ambulatory Visit | Attending: Urology

## 2015-06-15 ENCOUNTER — Ambulatory Visit (HOSPITAL_BASED_OUTPATIENT_CLINIC_OR_DEPARTMENT_OTHER)
Admission: RE | Admit: 2015-06-15 | Discharge: 2015-06-15 | Disposition: A | Discharge status: Home / Self Care | Payer: Commercial Managed Care - PPO | Source: Ambulatory Visit | Attending: Urology | Admitting: Urology

## 2015-06-15 DIAGNOSIS — E785 Hyperlipidemia, unspecified: Secondary | ICD-10-CM | POA: Insufficient documentation

## 2015-06-15 DIAGNOSIS — N132 Hydronephrosis with renal and ureteral calculous obstruction: Secondary | ICD-10-CM | POA: Insufficient documentation

## 2015-06-15 DIAGNOSIS — I129 Hypertensive chronic kidney disease with stage 1 through stage 4 chronic kidney disease, or unspecified chronic kidney disease: Secondary | ICD-10-CM | POA: Diagnosis not present

## 2015-06-15 DIAGNOSIS — I10 Essential (primary) hypertension: Secondary | ICD-10-CM | POA: Insufficient documentation

## 2015-06-15 DIAGNOSIS — Z79899 Other long term (current) drug therapy: Secondary | ICD-10-CM | POA: Insufficient documentation

## 2015-06-15 DIAGNOSIS — N182 Chronic kidney disease, stage 2 (mild): Secondary | ICD-10-CM | POA: Diagnosis not present

## 2015-06-15 DIAGNOSIS — N201 Calculus of ureter: Secondary | ICD-10-CM | POA: Diagnosis present

## 2015-06-15 HISTORY — PX: HOLMIUM LASER APPLICATION: SHX5852

## 2015-06-15 HISTORY — DX: Calculus of ureter: N20.1

## 2015-06-15 HISTORY — DX: Urgency of urination: R39.15

## 2015-06-15 HISTORY — PX: CYSTOSCOPY/RETROGRADE/URETEROSCOPY/STONE EXTRACTION WITH BASKET: SHX5317

## 2015-06-15 HISTORY — DX: Personal history of urinary calculi: Z87.442

## 2015-06-15 SURGERY — CYSTOSCOPY, WITH CALCULUS REMOVAL USING BASKET
Anesthesia: General | Laterality: Left

## 2015-06-15 MED ORDER — TAMSULOSIN HCL 0.4 MG PO CAPS
0.4000 mg | ORAL_CAPSULE | Freq: Once | ORAL | Status: AC
  Administered 2015-06-15: 0.4 mg via ORAL
  Filled 2015-06-15: qty 1

## 2015-06-15 MED ORDER — DEXAMETHASONE SODIUM PHOSPHATE 10 MG/ML IJ SOLN
INTRAMUSCULAR | Status: AC
  Filled 2015-06-15: qty 1

## 2015-06-15 MED ORDER — KETOROLAC TROMETHAMINE 30 MG/ML IJ SOLN
INTRAMUSCULAR | Status: AC
  Filled 2015-06-15: qty 1

## 2015-06-15 MED ORDER — ONDANSETRON HCL 4 MG/2ML IJ SOLN
INTRAMUSCULAR | Status: AC
  Filled 2015-06-15: qty 2

## 2015-06-15 MED ORDER — SULFAMETHOXAZOLE-TRIMETHOPRIM 800-160 MG PO TABS: 1.0000 | ORAL_TABLET | Freq: Two times a day (BID) | ORAL | Status: DC

## 2015-06-15 MED ORDER — MIDAZOLAM HCL 2 MG/2ML IJ SOLN
INTRAMUSCULAR | Status: AC
  Filled 2015-06-15: qty 2

## 2015-06-15 MED ORDER — ONDANSETRON HCL 4 MG/2ML IJ SOLN
INTRAMUSCULAR | Status: DC | PRN
  Administered 2015-06-15: 4 mg via INTRAVENOUS

## 2015-06-15 MED ORDER — FENTANYL CITRATE (PF) 100 MCG/2ML IJ SOLN
INTRAMUSCULAR | Status: AC
  Filled 2015-06-15: qty 2

## 2015-06-15 MED ORDER — SODIUM CHLORIDE 0.9 % IR SOLN
Status: DC | PRN
  Administered 2015-06-15: 3000 mL via INTRAVESICAL
  Administered 2015-06-15: 1000 mL via INTRAVESICAL

## 2015-06-15 MED ORDER — LIDOCAINE HCL (CARDIAC) 20 MG/ML IV SOLN
INTRAVENOUS | Status: AC
  Filled 2015-06-15: qty 5

## 2015-06-15 MED ORDER — DEXTROSE 5 % IV SOLN
INTRAVENOUS | Status: AC
  Filled 2015-06-15: qty 50

## 2015-06-15 MED ORDER — LIDOCAINE 2% (20 MG/ML) 5 ML SYRINGE
INTRAMUSCULAR | Status: DC | PRN
  Administered 2015-06-15: 60 mg via INTRAVENOUS

## 2015-06-15 MED ORDER — SCOPOLAMINE 1 MG/3DAYS TD PT72
MEDICATED_PATCH | TRANSDERMAL | Status: AC
  Filled 2015-06-15: qty 1

## 2015-06-15 MED ORDER — CEFTRIAXONE SODIUM 2 G IJ SOLR
INTRAMUSCULAR | Status: AC
  Filled 2015-06-15: qty 2

## 2015-06-15 MED ORDER — KETOROLAC TROMETHAMINE 30 MG/ML IJ SOLN
INTRAMUSCULAR | Status: DC | PRN
  Administered 2015-06-15: 30 mg via INTRAVENOUS

## 2015-06-15 MED ORDER — DEXAMETHASONE SODIUM PHOSPHATE 4 MG/ML IJ SOLN
INTRAMUSCULAR | Status: DC | PRN
  Administered 2015-06-15: 10 mg via INTRAVENOUS

## 2015-06-15 MED ORDER — DEXTROSE 5 % IV SOLN
2.0000 g | INTRAVENOUS | Status: AC
  Administered 2015-06-15: 2 g via INTRAVENOUS
  Filled 2015-06-15: qty 2

## 2015-06-15 MED ORDER — MIDAZOLAM HCL 5 MG/5ML IJ SOLN
INTRAMUSCULAR | Status: DC | PRN
  Administered 2015-06-15: 2 mg via INTRAVENOUS

## 2015-06-15 MED ORDER — TAMSULOSIN HCL 0.4 MG PO CAPS: 0.4000 mg | ORAL_CAPSULE | Freq: Every day | ORAL | Status: DC

## 2015-06-15 MED ORDER — PROMETHAZINE HCL 25 MG/ML IJ SOLN
6.2500 mg | INTRAMUSCULAR | Status: DC | PRN
Start: 2015-06-15 — End: 2015-06-15
  Filled 2015-06-15: qty 1

## 2015-06-15 MED ORDER — OXYCODONE-ACETAMINOPHEN 5-325 MG PO TABS: 1.0000 | ORAL_TABLET | ORAL | Status: DC | PRN

## 2015-06-15 MED ORDER — FENTANYL CITRATE (PF) 100 MCG/2ML IJ SOLN
INTRAMUSCULAR | Status: DC | PRN
  Administered 2015-06-15 (×2): 25 ug via INTRAVENOUS
  Administered 2015-06-15 (×2): 50 ug via INTRAVENOUS
  Administered 2015-06-15: 25 ug via INTRAVENOUS

## 2015-06-15 MED ORDER — PROPOFOL 10 MG/ML IV BOLUS
INTRAVENOUS | Status: DC | PRN
  Administered 2015-06-15: 200 mg via INTRAVENOUS

## 2015-06-15 MED ORDER — SCOPOLAMINE 1 MG/3DAYS TD PT72
1.0000 | MEDICATED_PATCH | TRANSDERMAL | Status: DC
  Administered 2015-06-15: 1.5 mg via TRANSDERMAL
  Filled 2015-06-15: qty 1

## 2015-06-15 MED ORDER — TAMSULOSIN HCL 0.4 MG PO CAPS
ORAL_CAPSULE | ORAL | Status: AC
  Filled 2015-06-15: qty 1

## 2015-06-15 MED ORDER — FENTANYL CITRATE (PF) 100 MCG/2ML IJ SOLN
25.0000 ug | INTRAMUSCULAR | Status: DC | PRN
  Administered 2015-06-15: 25 ug via INTRAVENOUS
  Filled 2015-06-15: qty 1

## 2015-06-15 MED ORDER — LACTATED RINGERS IV SOLN
INTRAVENOUS | Status: DC
  Administered 2015-06-15 (×2): via INTRAVENOUS
  Filled 2015-06-15: qty 1000

## 2015-06-15 MED ORDER — DEXTROSE 5 % IV SOLN
1.0000 g | INTRAVENOUS | Status: DC
  Filled 2015-06-15: qty 10

## 2015-06-15 SURGICAL SUPPLY — 30 items
BAG DRAIN URO-CYSTO SKYTR STRL (DRAIN) ×3 IMPLANT
BAG DRN UROCATH (DRAIN) ×1
BASKET DAKOTA 1.9FR 11X120 (BASKET) IMPLANT
BASKET LASER NITINOL 1.9FR (BASKET) IMPLANT
BASKET STONE 1.7 NGAGE (UROLOGICAL SUPPLIES) ×2 IMPLANT
BASKET ZERO TIP NITINOL 2.4FR (BASKET) IMPLANT
BSKT STON RTRVL 120 1.9FR (BASKET)
BSKT STON RTRVL ZERO TP 2.4FR (BASKET)
CANISTER SUCT LVC 12 LTR MEDI- (MISCELLANEOUS) IMPLANT
CATH INTERMIT  6FR 70CM (CATHETERS) ×2 IMPLANT
CLOTH BEACON ORANGE TIMEOUT ST (SAFETY) ×3 IMPLANT
FIBER LASER FLEXIVA 365 (UROLOGICAL SUPPLIES) IMPLANT
FIBER LASER TRAC TIP (UROLOGICAL SUPPLIES) ×2 IMPLANT
GLOVE BIO SURGEON STRL SZ8 (GLOVE) ×3 IMPLANT
GOWN STRL REUS W/ TWL LRG LVL3 (GOWN DISPOSABLE) ×1 IMPLANT
GOWN STRL REUS W/ TWL XL LVL3 (GOWN DISPOSABLE) ×1 IMPLANT
GOWN STRL REUS W/TWL LRG LVL3 (GOWN DISPOSABLE) ×3
GOWN STRL REUS W/TWL XL LVL3 (GOWN DISPOSABLE) ×3
GUIDEWIRE ANG ZIPWIRE 038X150 (WIRE) ×1 IMPLANT
GUIDEWIRE STR DUAL SENSOR (WIRE) ×2 IMPLANT
IV NS IRRIG 3000ML ARTHROMATIC (IV SOLUTION) ×3 IMPLANT
KIT ROOM TURNOVER WOR (KITS) ×3 IMPLANT
MANIFOLD NEPTUNE II (INSTRUMENTS) ×2 IMPLANT
PACK CYSTO (CUSTOM PROCEDURE TRAY) ×3 IMPLANT
SHEATH ACCESS URETERAL 38CM (SHEATH) ×2 IMPLANT
STENT URET 6FRX26 CONTOUR (STENTS) ×2 IMPLANT
SYRINGE 10CC LL (SYRINGE) ×1 IMPLANT
TUBE CONNECTING 12'X1/4 (SUCTIONS)
TUBE CONNECTING 12X1/4 (SUCTIONS) IMPLANT
TUBE FEEDING 8FR 16IN STR KANG (MISCELLANEOUS) IMPLANT

## 2015-06-15 NOTE — Brief Op Note (Signed)
06/15/2015  1:46 PM  PATIENT:  Patrick Campbell  55 y.o. male  PRE-OPERATIVE DIAGNOSIS:  LEFT URETERAL STONE  POST-OPERATIVE DIAGNOSIS:  LEFT URETERAL STONE  PROCEDURE:  Procedure(s): CYSTOSCOPY/RETROGRADE/URETEROSCOPY/STONE EXTRACTION WITH BASKET WITH STENT EXCHANGE (Left) HOLMIUM LASER APPLICATION (Left)  SURGEON:  Surgeon(s) and Role:    * Cleon Gustin, MD - Primary  PHYSICIAN ASSISTANT:   ASSISTANTS: none   ANESTHESIA:   general  EBL:  Total I/O In: 1000 [I.V.:1000] Out: -   BLOOD ADMINISTERED:none  DRAINS: left 6x26 JJ ureteral stent with tether  LOCAL MEDICATIONS USED:  NONE  SPECIMEN:  Source of Specimen:  left renal calculus  DISPOSITION OF SPECIMEN:  N/A  COUNTS:  YES  TOURNIQUET:  * No tourniquets in log *  DICTATION: .Note written in EPIC  PLAN OF CARE: Discharge to home after PACU  PATIENT DISPOSITION:  PACU - hemodynamically stable.   Delay start of Pharmacological VTE agent (>24hrs) due to surgical blood loss or risk of bleeding: not applicable

## 2015-06-15 NOTE — Discharge Instructions (Signed)
Ureteroscopy, Care After Refer to this sheet in the next few weeks. These instructions provide you with information on caring for yourself after your procedure. Your health care provider may also give you more specific instructions. Your treatment has been planned according to current medical practices, but problems sometimes occur. Call your health care provider if you have any problems or questions after your procedure.  WHAT TO EXPECT AFTER THE PROCEDURE  After your procedure, it is typical to have the following:   A burning sensation when you urinate.  Blood in your urine. HOME CARE INSTRUCTIONS   Only take medicines as directed by your health care provider. Do not take any over-the-counter pain medication unless your health care provider says it is okay.  Take a warm bath or hold a warm washcloth over your groin to relieve burning.  Drink enough fluids to keep your urine clear or pale yellow.  Drink two 8-ounce glasses of water every hour for the first 2 hours after you get home.  Continue to drink water often at home.  You can eat what you usually do.  Ask your surgeon when you can do your usual activities.  If you had a tube placed to keep urine flowing (ureteral stent), ask your health care provider when you need to return to have it removed.  Keep all follow-up appointments. SEEK MEDICAL CARE IF:   You have chills or fever.  You have burning pain for longer than 24 hours after the procedure.  You have blood in your urine for longer than 24 hours after the procedure. SEEK IMMEDIATE MEDICAL CARE IF:   You have large amounts of blood or clots in your urine.  You have very bad pain.  You have chest pain or trouble breathing.   This information is not intended to replace advice given to you by your health care provider. Make sure you discuss any questions you have with your health care provider.   Document Released: 01/05/2013 Document Reviewed: 01/05/2013 Elsevier  Interactive Patient Education 2016 [facility] Home Care Instructions  Activity: Get plenty of rest for the remainder of the day. A responsible adult should stay with you for 24 hours following the procedure.  For the next 24 hours, DO NOT: -Drive a car -Paediatric nurse -Drink alcoholic beverages -Take any medication unless instructed by your physician -Make any legal decisions or sign important papers.  Meals: Start with liquid foods such as gelatin or soup. Progress to regular foods as tolerated. Avoid greasy, spicy, heavy foods. If nausea and/or vomiting occur, drink only clear liquids until the nausea and/or vomiting subsides. Call your physician if vomiting continues.  Special Instructions/Symptoms: Your throat may feel dry or sore from the anesthesia or the breathing tube placed in your throat during surgery. If this causes discomfort, gargle with warm salt water. The discomfort should disappear within 24 hours.  If you had a scopolamine patch placed behind your ear for the management of post- operative nausea and/or vomiting:  1. The medication in the patch is effective for 72 hours, after which it should be removed.  Wrap patch in a tissue and discard in the trash. Wash hands thoroughly with soap and water. 2. You may remove the patch earlier than 72 hours if you experience unpleasant side effects which may include dry mouth, dizziness or visual disturbances. 3. Avoid touching the patch. Wash your hands with soap and water after contact with the patch.   Ureteral Stent Implantation, Care After  Refer to this sheet in the next few weeks. These instructions provide you with information on caring for yourself after your procedure. Your health care provider may also give you more specific instructions. Your treatment has been planned according to current medical practices, but problems sometimes occur. Call your health care provider if you have any problems or  questions after your procedure. WHAT TO EXPECT AFTER THE PROCEDURE You should be back to normal activity within 48 hours after the procedure. Nausea and vomiting may occur and are commonly the result of anesthesia. It is common to experience sharp pain in the back or lower abdomen and penis with voiding. This is caused by movement of the ends of the stent with the act of urinating.It usually goes away within minutes after you have stopped urinating. HOME CARE INSTRUCTIONS Make sure to drink plenty of fluids. You may have small amounts of bleeding, causing your urine to be red. This is normal. Certain movements may trigger pain or a feeling that you need to urinate. You may be given medicines to prevent infection or bladder spasms. Be sure to take all medicines as directed. Only take over-the-counter or prescription medicines for pain, discomfort, or fever as directed by your health care provider. Do not take aspirin, as this can make bleeding worse. Your stent will be left in until the blockage is resolved. This may take 2 weeks or longer, depending on the reason for stent implantation. You may have an X-ray exam to make sure your ureter is open and that the stent has not moved out of position (migrated). The stent can be removed by your health care provider in the office. Medicines may be given for comfort while the stent is being removed. Be sure to keep all follow-up appointments so your health care provider can check that you are healing properly. SEEK MEDICAL CARE IF:  You experience increasing pain.  Your pain medicine is not working. SEEK IMMEDIATE MEDICAL CARE IF:  Your urine is dark red or has blood clots.  You are leaking urine (incontinent).  You have a fever, chills, feeling sick to your stomach (nausea), or vomiting.  Your pain is not relieved by pain medicine.  The end of the stent comes out of the urethra.  You are unable to urinate.   This information is not intended to  replace advice given to you by your health care provider. Make sure you discuss any questions you have with your health care provider.   Document Released: 09/02/2012 Document Revised: 01/05/2013 Document Reviewed: 07/15/2014 Elsevier Interactive Patient Education 2016 [place] may remove the stent Sunday morning.

## 2015-06-15 NOTE — Interval H&P Note (Signed)
History and Physical Interval Note:  06/15/2015 11:53 AM  Patrick Campbell  has presented today for surgery, with the diagnosis of LEFT URETERAL STONE  The various methods of treatment have been discussed with the patient and family. After consideration of risks, benefits and other options for treatment, the patient has consented to  Procedure(s): CYSTOSCOPY/RETROGRADE/URETEROSCOPY/STONE EXTRACTION WITH BASKET WITH STENT PLACMENT (Left) HOLMIUM LASER APPLICATION (Left) as a surgical intervention .  The patient's history has been reviewed, patient examined, no change in status, stable for surgery.  I have reviewed the patient's chart and labs.  Questions were answered to the patient's satisfaction.     Nicolette Bang

## 2015-06-15 NOTE — Anesthesia Procedure Notes (Signed)
Procedure Name: LMA Insertion Date/Time: 06/15/2015 12:56 PM Performed by: Denna Haggard D Pre-anesthesia Checklist: Patient identified, Emergency Drugs available, Suction available and Patient being monitored Patient Re-evaluated:Patient Re-evaluated prior to inductionOxygen Delivery Method: Circle system utilized Preoxygenation: Pre-oxygenation with 100% oxygen Intubation Type: IV induction Ventilation: Mask ventilation without difficulty LMA: LMA inserted LMA Size: 4.0 Number of attempts: 1 Airway Equipment and Method: Bite block Placement Confirmation: positive ETCO2 Tube secured with: Tape Dental Injury: Teeth and Oropharynx as per pre-operative assessment

## 2015-06-15 NOTE — Anesthesia Preprocedure Evaluation (Signed)
Anesthesia Evaluation  Patient identified by MRN, date of birth, ID band Patient awake    Reviewed: Allergy & Precautions, NPO status , Patient's Chart, lab work & pertinent test results  History of Anesthesia Complications Negative for: history of anesthetic complications  Airway Mallampati: II  TM Distance: >3 FB Neck ROM: Full    Dental  (+) Teeth Intact, Dental Advisory Given   Pulmonary neg pulmonary ROS,    Pulmonary exam normal breath sounds clear to auscultation       Cardiovascular Exercise Tolerance: Good hypertension, Pt. on medications (-) angina(-) Past MI and (-) CHF Normal cardiovascular exam Rhythm:Regular Rate:Normal     Neuro/Psych  Headaches, negative psych ROS   GI/Hepatic negative GI ROS, Neg liver ROS,   Endo/Other  negative endocrine ROS  Renal/GU Renal InsufficiencyRenal disease     Musculoskeletal negative musculoskeletal ROS (+)   Abdominal   Peds  Hematology negative hematology ROS (+)   Anesthesia Other Findings Day of surgery medications reviewed with the patient.  Reproductive/Obstetrics                             Anesthesia Physical Anesthesia Plan  ASA: II  Anesthesia Plan: General   Post-op Pain Management:    Induction: Intravenous  Airway Management Planned: LMA  Additional Equipment:   Intra-op Plan:   Post-operative Plan: Extubation in OR  Informed Consent: I have reviewed the patients History and Physical, chart, labs and discussed the procedure including the risks, benefits and alternatives for the proposed anesthesia with the patient or authorized representative who has indicated his/her understanding and acceptance.   Dental advisory given  Plan Discussed with: CRNA  Anesthesia Plan Comments: (Risks/benefits of general anesthesia discussed with patient including risk of damage to teeth, lips, gum, and tongue, nausea/vomiting,  allergic reactions to medications, and the possibility of heart attack, stroke and death.  All patient questions answered.  Patient wishes to proceed.)        Anesthesia Quick Evaluation

## 2015-06-15 NOTE — Transfer of Care (Signed)
Immediate Anesthesia Transfer of Care Note  Patient: Patrick Campbell  Procedure(s) Performed: Procedure(s) (LRB): CYSTOSCOPY/RETROGRADE/URETEROSCOPY/STONE EXTRACTION WITH BASKET WITH STENT EXCHANGE (Left) HOLMIUM LASER APPLICATION (Left)  Patient Location: PACU  Anesthesia Type: General  Level of Consciousness: awake, oriented, sedated and patient cooperative  Airway & Oxygen Therapy: Patient Spontanous Breathing and Patient connected to face mask oxygen  Post-op Assessment: Report given to PACU RN and Post -op Vital signs reviewed and stable  Post vital signs: Reviewed and stable  Complications: No apparent anesthesia complications

## 2015-06-15 NOTE — H&P (View-Only) (Signed)
Urology Admission H&P  Chief Complaint: left flank pain  History of Present Illness: Patrick Campbell is a 55yo with ahx of nephrolithiasis who presented to my office with a 3 week hx of progressively worsening left flank pain. He underwent CT scan which showed a 34mm proximal left ureteral calculus. He has associated nausea. No LUTS  Past Medical History  Diagnosis Date  . Nephrolithiasis 04/1999 & 11/2003    w/right hydronephrosis on u/s 04/1999 & on CT 11/2003--resolved on 12/2013 CT.  Left hydronephrosis 12/2013 CT.  Marland Kitchen Acquired renal cyst of left kidney [id number]    Complicated cyst vs solid mass: contrast enhanced CT was recommended but ?apparently not done?  . Cholelithiasis 11/2003    Noted on noncontrast CT abd.  No cholecystitis.  . Hypogonadism male 02/2010  . Hypertension   . Chronic renal insufficiency, stage II (mild)     Borderline stage II/III (CrCl estimate @ low 60s)  . Polycythemia 05/10/2010    EPO level normal  . Hyperlipidemia    Past Surgical History  Procedure Laterality Date  . Knee surgery  2002  . Colonoscopy  09/30/11    Normal-repeat in 10 yrs    Home Medications:  Prescriptions prior to admission  Medication Sig Dispense Refill Last Dose  . amLODipine (NORVASC) 10 MG tablet Take 1 tablet (10 mg total) by mouth daily. 90 tablet 2 05/28/2015 at Unknown time  . ANDROGEL PUMP 20.25 MG/ACT (1.62%) GEL APPLY 2 PUMPS DAILY AS DIRECTED  2 05/27/2015  . atorvastatin (LIPITOR) 40 MG tablet Take 1 tablet (40 mg total) by mouth daily. 90 tablet 2 05/28/2015 at Unknown time  . diazepam (VALIUM) 5 MG tablet Take 5 mg by mouth every 8 (eight) hours as needed for muscle spasms (kidney stone spasms).     Marland Kitchen ibuprofen (ADVIL,MOTRIN) 200 MG tablet Take 400 mg by mouth every 6 (six) hours as needed for fever, headache, mild pain, moderate pain or cramping.   05/28/2015 at Unknown time  . oxybutynin (DITROPAN) 5 MG tablet Take 5 mg by mouth every 8 (eight) hours as needed for bladder  spasms.     Marland Kitchen oxyCODONE-acetaminophen (PERCOCET/ROXICET) 5-325 MG tablet Take 1 tablet by mouth every 4 (four) hours as needed for moderate pain or severe pain.     . phenazopyridine (PYRIDIUM) 100 MG tablet Take 100 mg by mouth 3 (three) times daily as needed for pain.     . tadalafil (CIALIS) 10 MG tablet Take 1 tablet (10 mg total) by mouth daily as needed for erectile dysfunction. 10 tablet 0 unknown  . tamsulosin (FLOMAX) 0.4 MG CAPS capsule Take 0.4 mg by mouth at bedtime.     Marland Kitchen albuterol (PROVENTIL HFA;VENTOLIN HFA) 108 (90 BASE) MCG/ACT inhaler Inhale 2 puffs into the lungs 2 (two) times daily. (Patient not taking: Reported on 05/29/2015) 1 Inhaler 0 Not Taking at Unknown time  . lisinopril (PRINIVIL,ZESTRIL) 10 MG tablet Take 1 tablet (10 mg total) by mouth daily. (Patient not taking: Reported on 05/29/2015) 30 tablet 3 Not Taking at Unknown time  . naproxen (NAPROSYN) 500 MG tablet Take 1 tablet (500 mg total) by mouth 2 (two) times daily with a meal. (Patient not taking: Reported on 05/29/2015) 30 tablet 0 Not Taking at Unknown time  . oxyCODONE (OXY IR/ROXICODONE) 5 MG immediate release tablet 1-2 tabs po q6h prn pain (Patient not taking: Reported on 05/29/2015) 40 tablet 0 Not Taking at Unknown time   Allergies: No Known Allergies  Family History  Problem Relation Age of Onset  . Hypertension Mother   . Cancer Father     lung/ smoker  . Kidney disease Brother   . HIV Brother    Social History:  reports that he has never smoked. He has never used smokeless tobacco. He reports that he drinks about 1.8 - 2.4 oz of alcohol per week. He reports that he does not use illicit drugs.  Review of Systems  Genitourinary: Positive for flank pain.  All other systems reviewed and are negative.   Physical Exam:  Vital signs in last 24 hours: Temp:  [97.7 F (36.5 C)] 97.7 F (36.5 C) (05/15 1459) Pulse Rate:  [71] 71 (05/15 1459) Resp:  [18] 18 (05/15 1459) BP: (150)/(85) 150/85 mmHg  (05/15 1459) SpO2:  [96 %] 96 % (05/15 1459) Weight:  [98.793 kg (217 lb 12.8 oz)] 98.793 kg (217 lb 12.8 oz) (05/15 1459) Physical Exam  Constitutional: He is oriented to person, place, and time. He appears well-developed and well-nourished.  HENT:  Head: Normocephalic and atraumatic.  Eyes: EOM are normal. Pupils are equal, round, and reactive to light.  Neck: Normal range of motion. No thyromegaly present.  Cardiovascular: Normal rate and regular rhythm.   Respiratory: Effort normal. No respiratory distress.  GI: Soft. He exhibits no distension.  Musculoskeletal: Normal range of motion.  Neurological: He is alert and oriented to person, place, and time.  Skin: Skin is warm and dry.  Psychiatric: He has a normal mood and affect. His behavior is normal. Judgment and thought content normal.    Laboratory Data:  No results found for this or any previous visit (from the past 24 hour(s)). Recent Results (from the past 240 hour(s))  Urine Culture     Status: None   Collection Time: 05/25/15  9:31 AM  Result Value Ref Range Status   Colony Count NO GROWTH  Final   Organism ID, Bacteria NO GROWTH  Final   Creatinine:  Recent Labs  05/25/15 0931  CREATININE 1.63*   Baseline Creatinine: unknwon  Impression/Assessment:  55yo with left ureteral calculus and intractable pain  Plan:  The risks/benefits/alternatives to left ureteral stent placement was explained to the patient and he understands and wishes to proceed with surgery  Nicolette Bang 05/29/2015, 4:21 PM

## 2015-06-16 ENCOUNTER — Encounter (HOSPITAL_BASED_OUTPATIENT_CLINIC_OR_DEPARTMENT_OTHER): Payer: Self-pay | Admitting: Urology

## 2015-06-16 MED ORDER — NAPROXEN 500 MG PO TABS: 500.0000 mg | ORAL_TABLET | Freq: Two times a day (BID) | ORAL | Status: DC

## 2015-06-16 NOTE — Telephone Encounter (Signed)
Received another fax from pharmacy requesting this medication. Please advise. Thanks.

## 2015-06-16 NOTE — Telephone Encounter (Signed)
Rx sent 

## 2015-06-16 NOTE — Anesthesia Postprocedure Evaluation (Signed)
Anesthesia Post Note  Patient: Torrez Whack  Procedure(s) Performed: Procedure(s) (LRB): CYSTOSCOPY/RETROGRADE/URETEROSCOPY/STONE EXTRACTION WITH BASKET WITH STENT EXCHANGE (Left) HOLMIUM LASER APPLICATION (Left)  Patient location during evaluation: PACU Anesthesia Type: General Level of consciousness: awake and alert Pain management: pain level controlled Vital Signs Assessment: post-procedure vital signs reviewed and stable Respiratory status: spontaneous breathing, nonlabored ventilation, respiratory function stable and patient connected to nasal cannula oxygen Cardiovascular status: blood pressure returned to baseline and stable Postop Assessment: no signs of nausea or vomiting Anesthetic complications: no     Last Vitals:  Filed Vitals:   06/15/15 1500 06/15/15 1522  BP: 154/90 143/75  Pulse: 58 61  Temp:  36.5 C  Resp: 10 16    Last Pain:  Filed Vitals:   06/15/15 1523  PainSc: 0-No pain   Pain Goal: Patients Stated Pain Goal: 8 (06/15/15 1145)               Montez Hageman

## 2015-06-16 NOTE — Telephone Encounter (Signed)
OK to RF #60, with 1 additional RF.

## 2015-06-18 NOTE — Op Note (Signed)
.  Preoperative diagnosis: Left ureteral stone  Postoperative diagnosis: Same  Procedure: 1 cystoscopy 2. Left retrograde pyelography 3.  Intraoperative fluoroscopy, under one hour, with interpretation 4.  Left ureteroscopic stone manipulation with laser lithotripsy 5.  Left 6 x 26 JJ stent exchange  Attending: Nicolette Bang  Anesthesia: General  Estimated blood loss: None  Drains: Left 6 x 26 JJ ureteral stent with tether  Specimens: stone for analysis  Antibiotics: rocephin  Findings: left renal pelvis stone. mild hydronephrosis. No masses/lesions in the bladder. Ureteral orifices in normal anatomic location.  Indications: Patient is a 55 year old male with a history of left renal stone and who has persistent left flank pain.  After discussing treatment options, he decided proceed with left ureteroscopic stone manipulation.  Procedure her in detail: The patient was brought to the operating room and a brief timeout was done to ensure correct patient, correct procedure, correct site.  General anesthesia was administered patient was placed in dorsal lithotomy position.  Her genitalia was then prepped and draped in usual sterile fashion.  A rigid 40 French cystoscope was passed in the urethra and the bladder.  Bladder was inspected free masses or lesions.  the ureteral orifices were in the normal orthotopic locations. Using a grasper the left stent was brought to the urethral meatus. Through the stent a zipwire was advanced up to the renal pelvis and the stent was removed. a 6 french ureteral catheter was then instilled into the left ureteral orifice.  a gentle retrograde was obtained and findings noted above.  we then removed the cystoscope and cannulated the left ureteral orifice with a semirigid ureteroscope.  No stone was found in the ureter. Once we reached the UPJ a sensor wire was advanced in to the renal pelvis. We then removed the ureteroscope and advanced a 12/14 x 38cm access  sheath up to the renal pelvis. We then used the flexible ureteroscope to perform nephroscopy.  We encountered the stone in the renal pelvis/UPJ.  using using a 200 nm laser fiber and fragmented the stone into smaller pieces.  the pieces were then removed with a Ngage basket.  once all stone fragments were removed we then removed the access sheath under direct vision and noted no injury to the ureter. We then placed a 6 x 26 double-j ureteral stent over the original zip wire.  We removed the wire and good coil was noted in the the renal pelvis under fluoroscopy and the bladder under direct vision.   the bladder was then drained and this concluded the procedure which was well tolerated by patient.  Complications: None  Condition: Stable, extubated, transferred to PACU  Plan: Patient is to be discharged home as to follow-up in 2 weeks. He is to remove his stent by pulling the tether in 3 days

## 2016-04-30 ENCOUNTER — Ambulatory Visit (INDEPENDENT_AMBULATORY_CARE_PROVIDER_SITE_OTHER): Payer: Commercial Managed Care - PPO | Admitting: Gastroenterology

## 2016-04-30 ENCOUNTER — Encounter: Payer: Self-pay | Admitting: Gastroenterology

## 2016-04-30 ENCOUNTER — Encounter (INDEPENDENT_AMBULATORY_CARE_PROVIDER_SITE_OTHER): Payer: Self-pay

## 2016-04-30 VITALS — BP 150/100 | HR 88 | Resp 16 | Ht 71.5 in | Wt 222.0 lb

## 2016-04-30 DIAGNOSIS — K625 Hemorrhage of anus and rectum: Secondary | ICD-10-CM | POA: Diagnosis not present

## 2016-04-30 NOTE — Patient Instructions (Addendum)
Start taking daily powder fiber:benefiber or citracel.  Increase fluid intake.  Call back for any ongoing issues.  Normal BMI (Body Mass Index- based on height and weight) is between 19 and 25. Your BMI today is Body mass index is 30.53 kg/m. Patrick Campbell Please consider follow up  regarding your BMI with your Primary Care Provider.  Thank you

## 2016-04-30 NOTE — Progress Notes (Addendum)
04/30/2016 Jazion Atteberry [id number] 1960-03-20   HISTORY OF PRESENT ILLNESS:  This is a pleasant 56 year old AA male who is known to Dr. Hilarie Fredrickson for colonoscopy in 09/2011 at which time the study was normal.  He presents to our office today with complaints of rectal bleeding.  He tells me that he had one episode of bright red blood when he had a BM about 2 weeks ago.  The blood was bright red in color and was associated with a harder stool.  He says that he's always had some constipation on and off.  No change in bowel habits, rectal pain, abdominal pain, weight loss, etc.    Past Medical History:  Diagnosis Date  . Acquired renal cyst of left kidney 02/5425   Complicated cyst vs solid mass: contrast enhanced CT was recommended but ?apparently not done?  . Cholelithiasis    Noted on  CT abd.  No cholecystitis.  . Chronic renal insufficiency, stage II (mild)    Borderline stage II/III (CrCl estimate @ low 60s)  . History of kidney stones   . Hyperlipidemia   . Hypertension   . Hypogonadism male 02/2010  . Left ureteral stone   . Nephrolithiasis    left non-obstucive per ct 05-25-2015  . Polycythemia 05/10/2010   EPO level normal-- IDIOPATHIC  . Urgency of urination    Past Surgical History:  Procedure Laterality Date  . COLONOSCOPY  09/30/11   Normal-repeat in 10 yrs  . CYSTOSCOPY WITH STENT PLACEMENT Left 05/29/2015   Procedure: CYSTOSCOPY WITH STENT PLACEMENT, RETROGRADE;  Surgeon: Cleon Gustin, MD;  Location: [facility];  Service: Urology;  Laterality: Left;  . CYSTOSCOPY/RETROGRADE/URETEROSCOPY/STONE EXTRACTION WITH BASKET Left 06/15/2015   Procedure: CYSTOSCOPY/RETROGRADE/URETEROSCOPY/STONE EXTRACTION WITH BASKET WITH STENT EXCHANGE;  Surgeon: Cleon Gustin, MD;  Location: [facility];  Service: Urology;  Laterality: Left;  . HOLMIUM LASER APPLICATION Left 0/06/2374   Procedure: HOLMIUM LASER APPLICATION;  Surgeon: Cleon Gustin, MD;  Location: [facility];  Service: Urology;  Laterality: Left;  . KNEE SURGERY Right 2002    reports that he has never smoked. He has never used smokeless tobacco. He reports that he drinks about 1.8 - 2.4 oz of alcohol per week . He reports that he does not use drugs. family history includes Cancer in his father; HIV in his brother; Hypertension in his mother; Kidney disease in his brother. No Known Allergies    Outpatient Encounter Prescriptions as of 04/30/2016  Medication Sig  . amLODipine (NORVASC) 10 MG tablet Take 1 tablet (10 mg total) by mouth daily.  . ANDROGEL PUMP 20.25 MG/ACT (1.62%) GEL APPLY 2 PUMPS DAILY AS DIRECTED  . atorvastatin (LIPITOR) 40 MG tablet Take 1 tablet (40 mg total) by mouth daily. (Patient taking differently: Take 40 mg by mouth every evening. )  . tadalafil (CIALIS) 10 MG tablet Take 1 tablet (10 mg total) by mouth daily as needed for erectile dysfunction.  . [DISCONTINUED] lisinopril (PRINIVIL,ZESTRIL) 10 MG tablet Take 1 tablet (10 mg total) by mouth daily. (Patient taking differently: Take 10 mg by mouth every evening. )  . [DISCONTINUED] tamsulosin (FLOMAX) 0.4 MG CAPS capsule Take 1 capsule (0.4 mg total) by mouth at bedtime.  Marland Kitchen ibuprofen (ADVIL,MOTRIN) 200 MG tablet Take 400 mg by mouth every 6 (six) hours as needed for fever, headache, mild pain, moderate pain or cramping.  . naproxen (NAPROSYN) 500 MG tablet Take 1 tablet (500 mg total) by  mouth 2 (two) times daily after a meal. (Patient not taking: Reported on 04/30/2016)  . [DISCONTINUED] diazepam (VALIUM) 5 MG tablet Take 5 mg by mouth every 8 (eight) hours as needed for muscle spasms (kidney stone spasms).  . [DISCONTINUED] oxybutynin (DITROPAN) 5 MG tablet Take 5 mg by mouth every 8 (eight) hours as needed for bladder spasms.  . [DISCONTINUED] oxyCODONE-acetaminophen (PERCOCET/ROXICET) 5-325 MG tablet Take 1 tablet by mouth every 4 (four) hours as needed for moderate pain or severe pain.  . [DISCONTINUED]  phenazopyridine (PYRIDIUM) 100 MG tablet Take 100 mg by mouth 3 (three) times daily as needed for pain.  . [DISCONTINUED] sulfamethoxazole-trimethoprim (BACTRIM DS,SEPTRA DS) 800-160 MG tablet Take 1 tablet by mouth 2 (two) times daily.   No facility-administered encounter medications on file as of 04/30/2016.      REVIEW OF SYSTEMS  : All other systems reviewed and negative except where noted in the History of Present Illness.   PHYSICAL EXAM: BP (!) 150/100 Comment: stopped taking medication a couple days prior  Pulse 88   Resp 16   Ht 5' 11.5" (1.816 m)   Wt 222 lb (100.7 kg)   BMI 30.53 kg/m  General: Well developed black male in no acute distress Head: Normocephalic and atraumatic Eyes:  Sclerae anicteric, conjunctiva pink. Ears: Normal auditory acuity Lungs: Clear throughout to auscultation; no increased WOB. Heart: Regular rate and rhythm Abdomen: Soft, non-distended.  Normal bowel sounds.  Non-tender. Musculoskeletal: Symmetrical with no gross deformities  Skin: No lesions on visible extremities Extremities: No edema  Neurological: Alert oriented x 4, grossly non-focal Psychological:  Alert and cooperative. Normal mood and affect  ASSESSMENT AND PLAN: -Rectal bleeding:  One episode of bright red blood with BM about 2 weeks ago.  This was an isolated episode associated with some constipation and an episode of hard stool.  This was very likely some outlet bleeding that he experienced.  I offered him a colonoscopy, told him that it was certainly not unreasonable but with one isolated episode could certainly observe for now as well.  He was advised to start a daily powder fiber supplement such as Benefiber or Citrucel and increased fluid intake.  He will call back if he has any recurrent bleeding or if any other symptoms arise.    CC:  McGowen, Adrian Blackwater, MD  Addendum: Reviewed and agree with initial management. Jerene Bears, MD

## 2016-05-09 ENCOUNTER — Encounter: Payer: Self-pay | Admitting: Family Medicine

## 2016-05-21 ENCOUNTER — Emergency Department (HOSPITAL_BASED_OUTPATIENT_CLINIC_OR_DEPARTMENT_OTHER): Payer: Commercial Managed Care - PPO

## 2016-05-21 ENCOUNTER — Emergency Department (HOSPITAL_BASED_OUTPATIENT_CLINIC_OR_DEPARTMENT_OTHER)
Admission: EM | Admit: 2016-05-21 | Discharge: 2016-05-21 | Disposition: A | Discharge status: Home / Self Care | Payer: Commercial Managed Care - PPO | Attending: Emergency Medicine | Admitting: Emergency Medicine

## 2016-05-21 ENCOUNTER — Encounter (HOSPITAL_BASED_OUTPATIENT_CLINIC_OR_DEPARTMENT_OTHER): Payer: Self-pay | Admitting: *Deleted

## 2016-05-21 DIAGNOSIS — R0789 Other chest pain: Secondary | ICD-10-CM | POA: Diagnosis not present

## 2016-05-21 DIAGNOSIS — K8020 Calculus of gallbladder without cholecystitis without obstruction: Secondary | ICD-10-CM | POA: Diagnosis not present

## 2016-05-21 DIAGNOSIS — R1011 Right upper quadrant pain: Secondary | ICD-10-CM | POA: Diagnosis not present

## 2016-05-21 DIAGNOSIS — Z79899 Other long term (current) drug therapy: Secondary | ICD-10-CM | POA: Insufficient documentation

## 2016-05-21 DIAGNOSIS — I129 Hypertensive chronic kidney disease with stage 1 through stage 4 chronic kidney disease, or unspecified chronic kidney disease: Secondary | ICD-10-CM | POA: Insufficient documentation

## 2016-05-21 DIAGNOSIS — R1013 Epigastric pain: Secondary | ICD-10-CM | POA: Diagnosis not present

## 2016-05-21 DIAGNOSIS — N182 Chronic kidney disease, stage 2 (mild): Secondary | ICD-10-CM | POA: Insufficient documentation

## 2016-05-21 DIAGNOSIS — R079 Chest pain, unspecified: Secondary | ICD-10-CM | POA: Diagnosis not present

## 2016-05-21 LAB — BASIC METABOLIC PANEL
Anion gap: 8 (ref 5–15)
BUN: 22 mg/dL — AB (ref 6–20)
CHLORIDE: 106 mmol/L (ref 101–111)
CO2: 24 mmol/L (ref 22–32)
Calcium: 9.5 mg/dL (ref 8.9–10.3)
Creatinine, Ser: 1.38 mg/dL — ABNORMAL HIGH (ref 0.61–1.24)
GFR calc Af Amer: >60 mL/min (ref >60)
GFR calc non Af Amer: 56 mL/min — ABNORMAL LOW (ref >60)
Glucose, Bld: 87 mg/dL (ref 65–99)
POTASSIUM: 4 mmol/L (ref 3.5–5.1)
SODIUM: 138 mmol/L (ref 135–145)

## 2016-05-21 LAB — CBC
HEMATOCRIT: 49.9 % (ref 39.0–52.0)
HEMOGLOBIN: 16.8 g/dL (ref 13.0–17.0)
MCH: 30.1 pg (ref 26.0–34.0)
MCHC: 33.7 g/dL (ref 30.0–36.0)
MCV: 89.3 fL (ref 78.0–100.0)
Platelets: 183 10*3/uL (ref 150–400)
RBC: 5.59 MIL/uL (ref 4.22–5.81)
RDW: 13.4 % (ref 11.5–15.5)
WBC: 7 10*3/uL (ref 4.0–10.5)

## 2016-05-21 LAB — D-DIMER, QUANTITATIVE (NOT AT ARMC)

## 2016-05-21 LAB — TROPONIN I: Troponin I: <0.03 ng/mL (ref <0.03)

## 2016-05-21 LAB — LIPASE, BLOOD: Lipase: 41 U/L (ref 11–51)

## 2016-05-21 MED ORDER — ASPIRIN 81 MG PO CHEW
324.0000 mg | CHEWABLE_TABLET | Freq: Once | ORAL | Status: AC
  Administered 2016-05-21: 324 mg via ORAL
  Filled 2016-05-21: qty 4

## 2016-05-21 MED ORDER — GI COCKTAIL ~~LOC~~
ORAL | Status: AC
  Filled 2016-05-21: qty 30

## 2016-05-21 MED ORDER — NITROGLYCERIN 0.4 MG SL SUBL
0.4000 mg | SUBLINGUAL_TABLET | Freq: Once | SUBLINGUAL | Status: DC
  Filled 2016-05-21: qty 1

## 2016-05-21 MED ORDER — GI COCKTAIL ~~LOC~~
30.0000 mL | Freq: Once | ORAL | Status: AC
  Administered 2016-05-21: 30 mL via ORAL

## 2016-05-21 MED ORDER — FAMOTIDINE IN NACL 20-0.9 MG/50ML-% IV SOLN
20.0000 mg | Freq: Once | INTRAVENOUS | Status: AC
  Administered 2016-05-21: 20 mg via INTRAVENOUS
  Filled 2016-05-21: qty 50

## 2016-05-21 MED ORDER — HYDROMORPHONE HCL 1 MG/ML IJ SOLN
0.5000 mg | Freq: Once | INTRAMUSCULAR | Status: AC
  Administered 2016-05-21: 0.5 mg via INTRAVENOUS
  Filled 2016-05-21: qty 1

## 2016-05-21 MED ORDER — ONDANSETRON HCL 4 MG/2ML IJ SOLN
4.0000 mg | Freq: Once | INTRAMUSCULAR | Status: AC
  Administered 2016-05-21: 4 mg via INTRAVENOUS
  Filled 2016-05-21: qty 2

## 2016-05-21 MED ORDER — OMEPRAZOLE 20 MG PO CPDR: 20.0000 mg | DELAYED_RELEASE_CAPSULE | Freq: Every day | ORAL | 0 refills | Status: DC

## 2016-05-21 MED ORDER — SUCRALFATE 1 GM/10ML PO SUSP: 1.0000 g | Freq: Three times a day (TID) | ORAL | 0 refills | Status: DC

## 2016-05-21 NOTE — Discharge Instructions (Signed)
your workup has been reassuring in the emergency department. All of your blood work and imaging has been normal. Do not have a specific cause of your epigastric and chest pain. Have given you an acid reducer to take once a day. Take the carafate before meals. Have given you a referral to the [facility] doctor may make an appointment. Make she follow up with your primary care doctor to have your kidney function rechecked. Make sure he drink plenty of fluids. Return to the emergency department if he developed worsening chest pain, worsening shortness of breath, fever or for any other reason.

## 2016-05-21 NOTE — ED Provider Notes (Signed)
[facility] Provider Note   CSN: [id number] Arrival date & time: 05/21/16  1807   By signing my name below, I, Neta Mends, attest that this documentation has been prepared under the direction and in the presence of Melina Schools, PA-C. Electronically Signed: Neta Mends, ED Scribe. 05/21/2016. 6:29 PM.   History   Chief Complaint Chief Complaint  Patient presents with  . Chest Pain    The history is provided by the patient. No language interpreter was used.   HPI Comments:  Patrick Campbell is a 56 y.o. male who presents to the Emergency Department complaining of constant chest pain x 1 hour. Pt states that the pain is primarily in the center of his chest and epigastric reagion. He states that the pain is non-radiating, describes the pain as "stabbing,". States the pain makes it difficult to breath. Pain came on while sitting down. Nonexertional. Did not seem to be pleuritic in nature. States that he thought it was indigestion. Patient denies any history of DVT/PE, prolonged immobilizations, recent hospitalizations/surgeries, exogenous hormone use, tobacco use, lower extremity edema, calf tenderness. Pain was not associated with diaphoresis, shortness of breath, nausea, emesis. Patient denies any cardiac history or early family cardiac history. States the pain is currently still there. Palpation and movement makes the pain worse. Nothing makes the pain better. He has not tried nothing for his symptoms. Patient denies any fever, chills, recent illnesses, cough, lightheadedness, dizziness, nausea, emesis, urinary symptoms, change in bowel habits.   Past Medical History:  Diagnosis Date  . Acquired renal cyst of left kidney 74/2595   Complicated cyst vs solid mass: contrast enhanced CT was recommended but ?apparently not done?  . Cholelithiasis    Noted on  CT abd.  No cholecystitis.  . Chronic renal insufficiency, stage II (mild)    Borderline stage II/III (CrCl  estimate @ low 60s)  . History of kidney stones   . Hyperlipidemia   . Hypertension   . Hypogonadism male 02/2010  . Left ureteral stone   . Nephrolithiasis    left non-obstucive per ct 05-25-2015  . Polycythemia 05/10/2010   EPO level normal-- IDIOPATHIC  . Rectal bleeding 04/2016   Saw GI--dx of outlet bleeding, reassured, constipation tx advised.  . Urgency of urination     Patient Active Problem List   Diagnosis Date Noted  . Rectal bleeding 04/30/2016  . Hydronephrosis of left kidney 05/26/2015  . Kidney stone 05/26/2015  . Ureteral stone 05/26/2015  . Left flank pain 05/25/2015  . Other malaise and fatigue 08/07/2012  . Health maintenance examination 07/15/2011  . Chronic headaches 06/12/2010  . Blurry vision 06/12/2010  . Chronic renal insufficiency 06/12/2010  . Polycythemia 05/10/2010  . Hyperlipidemia 05/09/2010  . Erectile dysfunction 05/09/2010  . HTN (hypertension), benign 05/09/2010  . Hypogonadism male     Past Surgical History:  Procedure Laterality Date  . COLONOSCOPY  09/30/11   Normal-repeat in 10 yrs  . CYSTOSCOPY WITH STENT PLACEMENT Left 05/29/2015   Procedure: CYSTOSCOPY WITH STENT PLACEMENT, RETROGRADE;  Surgeon: Cleon Gustin, MD;  Location: [facility];  Service: Urology;  Laterality: Left;  . CYSTOSCOPY/RETROGRADE/URETEROSCOPY/STONE EXTRACTION WITH BASKET Left 06/15/2015   Procedure: CYSTOSCOPY/RETROGRADE/URETEROSCOPY/STONE EXTRACTION WITH BASKET WITH STENT EXCHANGE;  Surgeon: Cleon Gustin, MD;  Location: [facility];  Service: Urology;  Laterality: Left;  . HOLMIUM LASER APPLICATION Left 06/17/8754   Procedure: HOLMIUM LASER APPLICATION;  Surgeon: Cleon Gustin, MD;  Location: [facility];  Service: Urology;  Laterality: Left;  . KNEE SURGERY Right 2002       Home Medications    Prior to Admission medications   Medication Sig Start Date End Date Taking? Authorizing Provider  amLODipine (NORVASC) 10 MG  tablet Take 1 tablet (10 mg total) by mouth daily. 07/15/11 05/21/16 Yes McGowen, Adrian Blackwater, MD  ANDROGEL PUMP 20.25 MG/ACT (1.62%) GEL APPLY 2 PUMPS DAILY AS DIRECTED 04/04/15  Yes [provider]  atorvastatin (LIPITOR) 40 MG tablet Take 1 tablet (40 mg total) by mouth daily. Patient taking differently: Take 40 mg by mouth every evening.  07/15/11  Yes McGowen, Adrian Blackwater, MD  ibuprofen (ADVIL,MOTRIN) 200 MG tablet Take 400 mg by mouth every 6 (six) hours as needed for fever, headache, mild pain, moderate pain or cramping.   Yes [provider]  naproxen (NAPROSYN) 500 MG tablet Take 1 tablet (500 mg total) by mouth 2 (two) times daily after a meal. 06/16/15  Yes McGowen, Adrian Blackwater, MD  tadalafil (CIALIS) 10 MG tablet Take 1 tablet (10 mg total) by mouth daily as needed for erectile dysfunction. 02/14/12  Yes McGowen, Adrian Blackwater, MD    Family History Family History  Problem Relation Age of Onset  . Hypertension Mother   . Cancer Father     lung/ smoker  . Kidney disease Brother   . HIV Brother     Social History Social History  Substance Use Topics  . Smoking status: Never Smoker  . Smokeless tobacco: Never Used  . Alcohol use 1.8 - 2.4 oz/week    3 - 4 Glasses of wine per week     Comment: 2 glasses of wine 2 days a week     Allergies   Patient has no known allergies.   Review of Systems Review of Systems  Constitutional: Negative for chills and fever.  HENT: Negative for congestion.   Eyes: Negative for visual disturbance.  Respiratory: Negative for cough, shortness of breath and wheezing.   Cardiovascular: Positive for chest pain. Negative for palpitations and leg swelling.  Gastrointestinal: Positive for abdominal pain (epigastric and ruq). Negative for diarrhea, nausea and vomiting.  Genitourinary: Negative for dysuria, flank pain, frequency, hematuria and urgency.  Musculoskeletal: Negative for back pain.  Skin: Negative.   Neurological: Negative for dizziness,  syncope, weakness, light-headedness, numbness and headaches.  All other systems reviewed and are negative.    Physical Exam Updated Vital Signs BP (!) 133/92 (BP Location: Left Arm)   Pulse (!) 52   Temp 98.5 F (36.9 C) (Oral)   Resp 14   Ht 5\' 11"  (1.803 m)   Wt 97.5 kg   SpO2 94%   BMI 29.99 kg/m   Physical Exam  Constitutional: He is oriented to person, place, and time. He appears well-developed and well-nourished. No distress.  Non toxic appearing  HENT:  Head: Normocephalic and atraumatic.  Mouth/Throat: Oropharynx is clear and moist.  Eyes: Conjunctivae are normal. Right eye exhibits no discharge. Left eye exhibits no discharge. No scleral icterus.  Neck: Normal range of motion. Neck supple. No JVD present. No thyromegaly present.  Cardiovascular: Normal rate, regular rhythm, normal heart sounds and intact distal pulses.  Exam reveals no gallop and no friction rub.   No murmur heard. Pulmonary/Chest: Effort normal and breath sounds normal. No respiratory distress. He has no wheezes. He has no rales. He exhibits tenderness.  Abdominal: Soft. Bowel sounds are normal. He exhibits no distension. There is tenderness in the right  upper quadrant and epigastric area. There is no rigidity, no rebound, no guarding and no CVA tenderness.  Musculoskeletal: Normal range of motion.  No lower extremity edema or calf tenderness.  Lymphadenopathy:    He has no cervical adenopathy.  Neurological: He is alert and oriented to person, place, and time.  Skin: Skin is warm and dry. Capillary refill takes less than 2 seconds.  Psychiatric: He has a normal mood and affect.  Nursing note and vitals reviewed.    ED Treatments / Results  DIAGNOSTIC STUDIES:  Oxygen Saturation is 98% on RA, normal by my interpretation.    COORDINATION OF CARE:  6:23 PM Discussed treatment plan with pt at bedside and pt agreed to plan.   Labs (all labs ordered are listed, but only abnormal results are  displayed) Labs Reviewed  BASIC METABOLIC PANEL - Abnormal; Notable for the following:       Result Value   BUN 22 (*)    Creatinine, Ser 1.38 (*)    GFR calc non Af Amer 56 (*)    All other components within normal limits  TROPONIN I  TROPONIN I  CBC  D-DIMER, QUANTITATIVE (NOT AT [facility])  LIPASE, BLOOD    EKG  EKG Interpretation  Date/Time:  Tuesday May 21 2016 18:13:05 EDT Ventricular Rate:  71 PR Interval:  166 QRS Duration: 84 QT Interval:  386 QTC Calculation: 419 R Axis:   19 Text Interpretation:  Normal sinus rhythm Normal ECG No STEMI.  Confirmed by LONG MD, JOSHUA [phone]) on 05/21/2016 6:35:28 PM       Radiology Dg Chest Portable 1 View  Result Date: 05/21/2016 CLINICAL DATA:  Central chest pain for 1 day. EXAM: PORTABLE CHEST 1 VIEW COMPARISON:  06/27/2013. FINDINGS: Borderline enlarged cardiac silhouette. Tortuous aorta. Clear lungs with normal vascularity. No pneumothorax or pleural fluid. Unremarkable bones. IMPRESSION: No acute abnormality. Electronically Signed   By: Claudie Revering M.D.   On: 05/21/2016 18:47   US Abdomen Limited Ruq  Result Date: 05/21/2016 CLINICAL DATA:  Epigastric and right upper quadrant abdominal pain for the past 15 hours. Known gallstone. EXAM: US ABDOMEN LIMITED - RIGHT UPPER QUADRANT COMPARISON:  Abdomen CT dated 05/25/2015. FINDINGS: Gallbladder: Mobile gallstone in the gallbladder with a maximum diameter of 1.8 cm. No gallbladder wall thickening or pericholecystic fluid. No sonographic Murphy sign. Common bile duct: Diameter: 2.4 mm Liver: No focal lesion identified. Within normal limits in parenchymal echogenicity. IMPRESSION: Cholelithiasis without evidence of cholecystitis. Electronically Signed   By: Claudie Revering M.D.   On: 05/21/2016 20:50    Procedures Procedures (including critical care time)  Medications Ordered in ED Medications  nitroGLYCERIN (NITROSTAT) SL tablet 0.4 mg (0.4 mg Sublingual Not Given 05/21/16 2122)  aspirin  chewable tablet 324 mg (324 mg Oral Given 05/21/16 1831)  gi cocktail (Maalox,Lidocaine,Donnatal) (30 mLs Oral Given 05/21/16 1832)  HYDROmorphone (DILAUDID) injection 0.5 mg (0.5 mg Intravenous Given 05/21/16 1904)  ondansetron (ZOFRAN) injection 4 mg (4 mg Intravenous Given 05/21/16 1902)  famotidine (PEPCID) IVPB 20 mg premix (0 mg Intravenous Stopped 05/21/16 2145)     Initial Impression / Assessment and Plan / ED Course  I have reviewed the triage vital signs and the nursing notes.  Pertinent labs & imaging results that were available during my care of the patient were reviewed by me and considered in my medical decision making (see chart for details).     Patient presents to the ED with complaints of lower substernal chest pain  and epigastric abdominal pain onset 1 hour ago. Chest pain is nonradiating and not associated with shortness breath, diaphoresis, nausea, emesis. EKG shows normal sinus rhythm. Negative delta troponins. Heart pathway score is 2. Presentation does not seem consistent with ACS. Patient given GI cocktail with little relief. Small course of pain medicine was given and patient has been pain free for the entire duration of the ER stay. Patient did seem to be tender in the epigastric and right upper quadrant. Concern for possible cholecystitis. Ultrasound was ordered that showed cholelithiasis without any cholecystitis. Lipase was normal. Low suspicion for pancreatitis. Chest x-ray was unremarkable. No free air under the diaphragm noted low suspicion for perforated ulcer. Patient's d-dimer was negative. Symptoms do not seem consistent with PE. Patient with mild elevation in creatinine which is at patient's baseline. All other labs are unremarkable including no leukocytosis. Patient is to be discharged with recommendation to follow up with PCP, cardiology, and gi in regards to today's hospital visit. Chest pain is not likely of cardiac or pulmonary etiology d/t presentation, VSS, no  tracheal deviation, no JVD or new murmur, RRR, breath sounds equal bilaterally, EKG without acute abnormalities, negative troponin, and negative CXR. Pt has been advised start a PPI and return to the ED is CP becomes exertional, associated with diaphoresis or nausea, radiates to left jaw/arm, worsens or becomes concerning in any way. Pt appears reliable for follow up and is agreeable to discharge.   Case has been discussed with  Dr. Laverta Baltimore who agrees with the above plan to discharge.    Final Clinical Impressions(s) / ED Diagnoses   Final diagnoses:  Epigastric abdominal pain  Atypical chest pain    New Prescriptions Discharge Medication List as of 05/21/2016 10:13 PM    START taking these medications   Details  omeprazole (PRILOSEC) 20 MG capsule Take 1 capsule (20 mg total) by mouth daily., Starting Tue 05/21/2016, Print    sucralfate (CARAFATE) 1 GM/10ML suspension Take 10 mLs (1 g total) by mouth 4 (four) times daily -  with meals and at bedtime., Starting Tue 05/21/2016, Print      I personally performed the services described in this documentation, which was scribed in my presence. The recorded information has been reviewed and is accurate.     Doristine Devoid, PA-C 05/22/16 0133    Margette Fast, MD 05/22/16 [phone]

## 2016-05-21 NOTE — ED Notes (Signed)
Patient transported to Ultrasound 

## 2016-05-21 NOTE — ED Notes (Signed)
ED Provider at bedside. 

## 2016-05-21 NOTE — ED Notes (Signed)
ED Provider at bedside discussing test results and dispo plan of care. 

## 2016-05-21 NOTE — ED Notes (Signed)
Pt returned to room from US.

## 2016-05-21 NOTE — ED Notes (Signed)
ED Provider at bedside discussing test results and plan of care. 

## 2016-05-21 NOTE — ED Triage Notes (Signed)
Chest soreness. Painful to move. Started an hour ago.

## 2016-05-23 ENCOUNTER — Ambulatory Visit: Payer: Commercial Managed Care - PPO | Admitting: Gastroenterology

## 2016-05-24 ENCOUNTER — Encounter: Payer: Self-pay | Admitting: Cardiovascular Disease

## 2016-05-24 ENCOUNTER — Ambulatory Visit (INDEPENDENT_AMBULATORY_CARE_PROVIDER_SITE_OTHER): Payer: Commercial Managed Care - PPO | Admitting: Cardiovascular Disease

## 2016-05-24 VITALS — BP 133/88 | HR 73 | Ht 71.0 in | Wt 216.8 lb

## 2016-05-24 DIAGNOSIS — R072 Precordial pain: Secondary | ICD-10-CM | POA: Diagnosis not present

## 2016-05-24 DIAGNOSIS — I10 Essential (primary) hypertension: Secondary | ICD-10-CM

## 2016-05-24 DIAGNOSIS — E7800 Pure hypercholesterolemia, unspecified: Secondary | ICD-10-CM

## 2016-05-24 NOTE — Patient Instructions (Signed)
Your physician has requested that you have an exercise tolerance test. For further information please visit [email or web address]. Please also follow instruction sheet, as given.  Dr Croitoru recommends that you follow-up with him as needed. 

## 2016-05-24 NOTE — Progress Notes (Signed)
Cardiology Office Note:    Date:  05/24/2016   ID:  Patrick Campbell, DOB 1960/08/09, MRN [id number]  PCP:  Tammi Sou, MD  Cardiologist:  Sanda Klein, MD  Referring MD: Margette Fast, MD   Chief Complaint  Patient presents with  . New Patient (Initial Visit)    ED follow up.   [name] is referred in consultation by Dr. Laverta Baltimore for epigastric/chest discomfort of possible cardiac etiology.  History of Present Illness:    Patrick Campbell is a 56 y.o. male with hypertension, hypercholesterolemia and borderline chronic kidney disease, without known cardiac illness and was evaluated in the emergency room on May 8 for epigastric/chest discomfort. He experienced a stabbing global retrosternal discomfort that he felt was distinctly worse with deep breathing. It would cut his breathing off. It occurred very abruptly, roughly 1 hour after eating a can of tuna. He was at rest at the time and did not notice any worsening of chest pain if he stood up and walked around. In the emergency room he was treated with dialogue with which calmed the pain. All in all the symptoms lasted for about an hour to and did not recur. He describes the pain as extremely severe. It was constant, not colicky. It did not associate syncope, palpitations, dizziness or diaphoresis.  Cup in the emergency room included a normal chest x-ray, normal ECG, normal cardiac troponin, undetectable d-dimer  [name] is very active and fit. He goes frequently to the gym and has a very intense workout during which time he does not have shortness of breath or chest pain.  He denies a history of recent immobilization or prolonged travel. He does not have a family history of cardiac disease premature death. He does have a history of kidney stones but did not have any flank pain. This pain felt nothing like previous renal colic. Since the visit to the emergency room he has been diagnosed with gallstones by ultrasonography. There was no  evidence of cholecystitis.  Past Medical History:  Diagnosis Date  . Acquired renal cyst of left kidney 03/8880   Complicated cyst vs solid mass: contrast enhanced CT was recommended but ?apparently not done?  . Cholelithiasis    Noted on  CT abd.  No cholecystitis.  . Chronic renal insufficiency, stage II (mild)    Borderline stage II/III (CrCl estimate @ low 60s)  . History of kidney stones   . Hyperlipidemia   . Hypertension   . Hypogonadism male 02/2010  . Left ureteral stone   . Nephrolithiasis    left non-obstucive per ct 05-25-2015  . Polycythemia 05/10/2010   EPO level normal-- IDIOPATHIC  . Rectal bleeding 04/2016   Saw GI--dx of outlet bleeding, reassured, constipation tx advised.  . Urgency of urination     Past Surgical History:  Procedure Laterality Date  . COLONOSCOPY  09/30/11   Normal-repeat in 10 yrs  . CYSTOSCOPY WITH STENT PLACEMENT Left 05/29/2015   Procedure: CYSTOSCOPY WITH STENT PLACEMENT, RETROGRADE;  Surgeon: Cleon Gustin, MD;  Location: [facility];  Service: Urology;  Laterality: Left;  . CYSTOSCOPY/RETROGRADE/URETEROSCOPY/STONE EXTRACTION WITH BASKET Left 06/15/2015   Procedure: CYSTOSCOPY/RETROGRADE/URETEROSCOPY/STONE EXTRACTION WITH BASKET WITH STENT EXCHANGE;  Surgeon: Cleon Gustin, MD;  Location: [facility];  Service: Urology;  Laterality: Left;  . HOLMIUM LASER APPLICATION Left 8/0/0349   Procedure: HOLMIUM LASER APPLICATION;  Surgeon: Cleon Gustin, MD;  Location: [facility];  Service: Urology;  Laterality: Left;  .  KNEE SURGERY Right 2002    Current Medications: Current Meds  Medication Sig  . amLODipine (NORVASC) 10 MG tablet Take 1 tablet (10 mg total) by mouth daily.  . ANDROGEL PUMP 20.25 MG/ACT (1.62%) GEL APPLY 2 PUMPS DAILY AS DIRECTED  . atorvastatin (LIPITOR) 40 MG tablet Take 1 tablet (40 mg total) by mouth daily. (Patient taking differently: Take 40 mg by mouth every evening. )  . ibuprofen  (ADVIL,MOTRIN) 200 MG tablet Take 400 mg by mouth every 6 (six) hours as needed for fever, headache, mild pain, moderate pain or cramping.  . naproxen (NAPROSYN) 500 MG tablet Take 1 tablet (500 mg total) by mouth 2 (two) times daily after a meal.  . sucralfate (CARAFATE) 1 GM/10ML suspension Take 10 mLs (1 g total) by mouth 4 (four) times daily -  with meals and at bedtime.  . [DISCONTINUED] omeprazole (PRILOSEC) 20 MG capsule Take 1 capsule (20 mg total) by mouth daily.  . [DISCONTINUED] tadalafil (CIALIS) 10 MG tablet Take 1 tablet (10 mg total) by mouth daily as needed for erectile dysfunction.     Allergies:   Patient has no known allergies.   Social History   Social History  . Marital status: Married    Spouse name: N/A  . Number of children: N/A  . Years of education: N/A   Social History Main Topics  . Smoking status: Never Smoker  . Smokeless tobacco: Never Used  . Alcohol use 1.8 - 2.4 oz/week    3 - 4 Glasses of wine per week     Comment: 2 glasses of wine 2 days a week  . Drug use: No  . Sexual activity: Yes    Partners: Female   Other Topics Concern  . None   Social History Narrative   Married, 3 children (15, 20, 21 yrs).  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   From [place].  Was in [employer] x 10 yrs in [place], [place].   Works out and plays sports regularly.  Rare wine intake.  No cigs or drugs.     Family History: The patient's  family history includes Cancer in his father; HIV in his brother; Hypertension in his mother; Kidney disease in his brother. ROS:   Please see the history of present illness.     All other systems reviewed and are negative.  EKGs/Labs/Other Studies Reviewed:    The following studies were reviewed today: Emergency room records, chest x-ray, EKG, labs, right upper quadrant ultrasound  EKG:  EKG is not ordered today.  The ekg ordered 5/8 demonstrates normal sinus rhythm, normal tracing  Recent Labs: 05/21/2016: BUN 22; Creatinine, Ser  1.38; Hemoglobin 16.8; Platelets 183; Potassium 4.0; Sodium 138   Recent Lipid Panel    Component Value Date/Time   CHOL 175 07/22/2011 0811   TRIG 104.0 07/22/2011 0811   HDL 43.40 07/22/2011 0811   CHOLHDL 4 07/22/2011 0811   VLDL 20.8 07/22/2011 0811   LDLCALC 111 (H) 07/22/2011 0811    Physical Exam:    VS:  BP 133/88   Pulse 73   Ht 5\' 11"  (1.803 m)   Wt 216 lb 12.8 oz (98.3 kg)   BMI 30.24 kg/m     Wt Readings from Last 3 Encounters:  05/24/16 216 lb 12.8 oz (98.3 kg)  05/21/16 215 lb (97.5 kg)  04/30/16 222 lb (100.7 kg)      General: Alert, oriented x3, no distress.Well-nourished. Appears muscular but also mildly obese Head: no evidence  of trauma, PERRL, EOMI, no exophtalmos or lid lag, no myxedema, no xanthelasma; normal ears, nose and oropharynx Neck: normal jugular venous pulsations and no hepatojugular reflux; brisk carotid pulses without delay and no carotid bruits Chest: clear to auscultation, no signs of consolidation by percussion or palpation, normal fremitus, symmetrical and full respiratory excursions Cardiovascular: normal position and quality of the apical impulse, regular rhythm, normal first and second heart sounds, no murmurs, rubs or gallops Abdomen: no tenderness or distention, no masses by palpation, no abnormal pulsatility or arterial bruits, normal bowel sounds, no hepatosplenomegaly Extremities: no clubbing, cyanosis or edema; 2+ radial, ulnar and brachial pulses bilaterally; 2+ right femoral, posterior tibial and dorsalis pedis pulses; 2+ left femoral, posterior tibial and dorsalis pedis pulses; no subclavian or femoral bruits Neurological: grossly nonfocal   ASSESSMENT:    1. Precordial chest pain    PLAN:    In order of problems listed above:  1. His symptoms are not typical for any one particular disorder. Due to the timing of the events, I favor biliary colic. Coronary etiology appears unlikely with a normal ECG and resolution without  specific therapy. He does have some coronary risk factors (hypertension and hyperlipidemia). He is a 56 year old man. It is reasonable for him to undergo a treadmill stress test, but I suspect the answer to his symptoms lies elsewhere.  2. HTN: Blood pressure control is fair. 3. HLP: Continue statin and amlodipine. I don't have a recent lipid profile. If his stress test was normal, target LDL less than 100.    Medication Adjustments/Labs and Tests Ordered: Current medicines are reviewed at length with the patient today.  Concerns regarding medicines are outlined above. Labs and tests ordered and medication changes are outlined in the patient instructions below:  Patient Instructions  Your physician has requested that you have an exercise tolerance test. For further information please visit [email or web address]. Please also follow instruction sheet, as given.  Dr Sallyanne Kuster recommends that you follow-up with him as needed.    Signed, Sanda Klein, MD  05/24/2016 3:19 PM    Marie

## 2016-05-27 ENCOUNTER — Encounter: Payer: Self-pay | Admitting: Family Medicine

## 2016-05-31 ENCOUNTER — Ambulatory Visit (INDEPENDENT_AMBULATORY_CARE_PROVIDER_SITE_OTHER): Payer: Commercial Managed Care - PPO | Admitting: Nurse Practitioner

## 2016-05-31 ENCOUNTER — Encounter: Payer: Self-pay | Admitting: Nurse Practitioner

## 2016-05-31 VITALS — BP 130/80 | HR 79 | Ht 71.0 in | Wt 216.0 lb

## 2016-05-31 DIAGNOSIS — R079 Chest pain, unspecified: Secondary | ICD-10-CM | POA: Diagnosis not present

## 2016-05-31 DIAGNOSIS — K8020 Calculus of gallbladder without cholecystitis without obstruction: Secondary | ICD-10-CM

## 2016-05-31 NOTE — Progress Notes (Addendum)
HPI: Patient is a 56 yo male known to Dr. Hilarie Fredrickson from a colon cancer screening in 2013.  Patient was seen in the ED ten days ago for evaluation of acute chest pain (centralized). Pain started around 5 PM while in a meeting. It was nonexertional, nonradiating . No associated shortness of breath though it did hurt to take a deep breath. No associated nausea or diaphoresis. He had no prior history of such described chest pain. He works out but had not recently started any new chest exercises. Patient has no history of GERD.  Denies dysphagia or odynophagia. The only unusual thing he did prior to onset of chest pain was eat a veggie burger earlier in the day. He had eaten [place] about an hour prior to onset of pain.  Patient tried Pepto at home but pain escalated over the hour. He went to the emergency department around 6 PM . EKG showed normal sinus rhythm. Troponin was negative. BMET at baseline , lipase normal . No acute abnormalities on CXR. Ultrasound revealed cholelithiasis, no other findings.  Patient was given Pepcid and a GI cocktail and released. No further episodes of chest pain.    Past Medical History:  Diagnosis Date  . Acquired renal cyst of left kidney 62/8315   Complicated cyst vs solid mass: contrast enhanced CT was recommended but ?apparently not done?  Marland Kitchen Atypical chest pain 05/2016   w/u in ED neg.  Saw Dr. Orene Desanctis for ED f/u and ETT planned but he feels like the pain was likely biliary colic.  Marland Kitchen Cholelithiasis    Noted on  CT abd.  No cholecystitis.  Reconfirmed on RUQ u/s 05/21/16.  Marland Kitchen Chronic renal insufficiency, stage II (mild)    Borderline stage II/III (CrCl estimate @ low 60s)  . History of kidney stones   . Hyperlipidemia   . Hypertension   . Hypogonadism male 02/2010  . Left ureteral stone   . Nephrolithiasis    left non-obstucive per ct 05-25-2015  . Polycythemia 05/10/2010   EPO level normal-- IDIOPATHIC  . Rectal bleeding 04/2016   Saw GI--dx of outlet bleeding,  reassured, constipation tx advised.  . Urgency of urination     Patient's surgical history, family medical history, social history, medications and allergies were all reviewed in Epic    Physical Exam: BP 130/80   Pulse 79   Ht 5\' 11"  (1.803 m)   Wt 216 lb (98 kg)   SpO2 98%   BMI 30.13 kg/m   GENERAL: well developed black male in NAD PSYCH: :Pleasant, cooperative, normal affect EENT:  conjunctiva pink, mucous membranes moist, neck supple without masses CARDIAC:  RRR, no murmur heard, no peripheral edema PULM: Normal respiratory effort, lungs CTA bilaterally, no wheezing ABDOMEN:  soft, nontender, nondistended, no obvious masses, no hepatomegaly,  normal bowel sounds SKIN:  turgor, no lesions seen Musculoskeletal:  Normal muscle tone, normal strength NEURO: Alert and oriented x 3, no focal neurologic deficits   ASSESSMENT and PLAN:  13. 56 year old male with episode of acute retrosternal chest pain of unclear etiology on 05/21/16. ACS ruled out in the ED.  No further pain.  Pain could have been acid reflux versus musculoskeletal. He has cholelithiasis so biliary etiology possible though presentation atypical. LFTs weren't drawn in ED. Cardiac etiology not excluded but Cardiology has low level of suspicion. He is scheduled for a stress test soon  - GI etiology not excluded but hard to know based on an isolated episode of  chest pain. Patient and wife are comfortable with a watch and wait approach. Patient will call us if cardiac workup is negative and he has recurrent chest pain.  Should this occur then we will proceed accordingly  2. Left renal stone. Non-contrast CTscan done in ED 05/21/16 reveals a 7 x 6 mm calculus just beyond the ureteropelvic junction. Severe hydronephrosis on the left with left renal edema.  -Will cc PCP in case additional workup needed.    Tye Savoy , NP 05/31/2016, 9:08 AM   Addendum: Reviewed and agree with initial management. Pyrtle, Lajuan Lines, MD

## 2016-05-31 NOTE — Patient Instructions (Signed)
We have provided you with a copy of your EKG  Dated 05-21-2016.  Please call us if you have any recurring problems.

## 2016-06-04 ENCOUNTER — Ambulatory Visit: Payer: Commercial Managed Care - PPO | Admitting: Family Medicine

## 2016-06-04 ENCOUNTER — Encounter: Payer: Self-pay | Admitting: Family Medicine

## 2016-06-04 NOTE — Progress Notes (Deleted)
OFFICE VISIT  06/04/2016   CC: No chief complaint on file.  HPI:    Patient is a 56 y.o.  male who presents for ED f/u: went to ED 05/21/16 for chest pain.  Records from this visit were reviewed today. CXR neg, EKG normal, troponins and D dimer neg, no imp with GI cocktail but imp with pain med.  Had some RUQ TTP so abd u/s was done and it showed cholelithiasis w/out cholecystis. Creatinine was mildly elevated, consistent with his baseline.  He was instructed to take a PPI daily and f/u with me, cardiology, and GI. He followed up with cardiology and cardiologist felt his CP was very likely non-cardiac but plans an ETT.  No med changes were made.  Cardiologist felt the pain was likely secondary to biliary colic.  Past Medical History:  Diagnosis Date  . Acquired renal cyst of left kidney 56/8127   Complicated cyst vs solid mass: contrast enhanced CT was recommended but ?apparently not done?  Marland Kitchen Atypical chest pain 05/2016   w/u in ED neg.  Saw Dr. Orene Desanctis for ED f/u and ETT planned but he feels like the pain was likely biliary colic.  Marland Kitchen Cholelithiasis    Noted on  CT abd.  No cholecystitis.  Reconfirmed on RUQ u/s 05/21/16.  Marland Kitchen Chronic renal insufficiency, stage II (mild)    Borderline stage II/III (CrCl estimate @ low 60s)  . History of kidney stones   . Hyperlipidemia   . Hypertension   . Hypogonadism male 02/2010  . Left ureteral stone   . Nephrolithiasis    left non-obstucive per ct 05-25-2015  . Polycythemia 05/10/2010   EPO level normal-- IDIOPATHIC  . Rectal bleeding 04/2016   Saw GI--dx of outlet bleeding, reassured, constipation tx advised.  . Urgency of urination     Past Surgical History:  Procedure Laterality Date  . COLONOSCOPY  09/30/11   Normal-repeat in 10 yrs  . CYSTOSCOPY WITH STENT PLACEMENT Left 05/29/2015   Procedure: CYSTOSCOPY WITH STENT PLACEMENT, RETROGRADE;  Surgeon: Cleon Gustin, MD;  Location: [facility];  Service: Urology;  Laterality: Left;  .  CYSTOSCOPY/RETROGRADE/URETEROSCOPY/STONE EXTRACTION WITH BASKET Left 06/15/2015   Procedure: CYSTOSCOPY/RETROGRADE/URETEROSCOPY/STONE EXTRACTION WITH BASKET WITH STENT EXCHANGE;  Surgeon: Cleon Gustin, MD;  Location: [facility];  Service: Urology;  Laterality: Left;  . HOLMIUM LASER APPLICATION Left 05/14/6999   Procedure: HOLMIUM LASER APPLICATION;  Surgeon: Cleon Gustin, MD;  Location: [facility];  Service: Urology;  Laterality: Left;  . KNEE SURGERY Right 2002    Outpatient Medications Prior to Visit  Medication Sig Dispense Refill  . amLODipine (NORVASC) 10 MG tablet Take 1 tablet (10 mg total) by mouth daily. 90 tablet 2  . ANDROGEL PUMP 20.25 MG/ACT (1.62%) GEL APPLY 2 PUMPS DAILY AS DIRECTED  2  . atorvastatin (LIPITOR) 40 MG tablet Take 40 mg by mouth daily. Take 1 tablet by mouth daily.     No facility-administered medications prior to visit.     No Known Allergies  ROS As per HPI  PE: There were no vitals taken for this visit. ***  LABS:    Chemistry      Component Value Date/Time   NA 138 05/21/2016 1822   K 4.0 05/21/2016 1822   CL 106 05/21/2016 1822   CO2 24 05/21/2016 1822   BUN 22 (H) 05/21/2016 1822   CREATININE 1.38 (H) 05/21/2016 1822      Component Value Date/Time   CALCIUM  9.5 05/21/2016 1822   ALKPHOS 53 04/05/2012 2209   AST 21 04/05/2012 2209   ALT 23 04/05/2012 2209   BILITOT 0.2 (L) 04/05/2012 2209     Lab Results  Component Value Date   CHOL 175 07/22/2011   HDL 43.40 07/22/2011   LDLCALC 111 (H) 07/22/2011   TRIG 104.0 07/22/2011   CHOLHDL 4 07/22/2011   Lab Results  Component Value Date   LIPASE 41 05/21/2016   Lab Results  Component Value Date   TROPONINI <0.03 05/21/2016   Lab Results  Component Value Date   DDIMER <0.27 05/21/2016    IMPRESSION AND PLAN:  No problem-specific Assessment & Plan notes found for this encounter.   FOLLOW UP: No Follow-up on file.

## 2016-06-06 ENCOUNTER — Ambulatory Visit: Payer: Commercial Managed Care - PPO | Admitting: Family Medicine

## 2016-06-12 ENCOUNTER — Telehealth (HOSPITAL_COMMUNITY): Payer: Self-pay

## 2016-06-12 NOTE — Telephone Encounter (Signed)
Encounter complete. 

## 2016-06-13 ENCOUNTER — Telehealth (HOSPITAL_COMMUNITY): Payer: Self-pay

## 2016-06-13 NOTE — Telephone Encounter (Signed)
Encounter complete. 

## 2016-06-14 ENCOUNTER — Ambulatory Visit (HOSPITAL_COMMUNITY): Admission: RE | Admit: 2016-06-14 | Payer: Commercial Managed Care - PPO | Source: Ambulatory Visit

## 2016-06-20 ENCOUNTER — Encounter: Payer: Self-pay | Admitting: Family Medicine

## 2016-07-09 ENCOUNTER — Telehealth (HOSPITAL_COMMUNITY): Payer: Self-pay | Admitting: Cardiovascular Disease

## 2016-07-09 NOTE — Telephone Encounter (Signed)
User: Cherie Dark A Date/time: 07/09/2016 11:19 AM  Comment: Called pt aned lmsg for him to CB to r/s ETT.   Context: [place] Schedule Orders/Appt Requests Outcome: Left Message  Phone number: [phone] Phone Type: Home Phone  Comm. type: Telephone Call type: Outgoing  Contact: [name] Relation to patient: Self  Letter:      User: Cherie Dark A Date/time: 06/25/2016 2:22 PM  Comment: Called pt and lmsg for him to CB to r/s ETT that was missed on 06/14/16.   Context: [place] Schedule Orders/Appt Requests Outcome: Left Message  Phone number: [phone] Phone Type: Home Phone  Comm. type: Telephone Call type: Outgoing  Contact: [name] Relation to patient: Self  Letter:      User: Cherie Dark A Date/time

## 2016-07-12 DIAGNOSIS — M7542 Impingement syndrome of left shoulder: Secondary | ICD-10-CM | POA: Diagnosis not present

## 2016-12-16 ENCOUNTER — Encounter: Payer: Self-pay | Admitting: Family Medicine

## 2016-12-16 ENCOUNTER — Ambulatory Visit (HOSPITAL_BASED_OUTPATIENT_CLINIC_OR_DEPARTMENT_OTHER)
Admission: RE | Admit: 2016-12-16 | Discharge: 2016-12-16 | Disposition: A | Discharge status: Home / Self Care | Payer: Commercial Managed Care - PPO | Source: Ambulatory Visit | Attending: Family Medicine | Admitting: Family Medicine

## 2016-12-16 ENCOUNTER — Other Ambulatory Visit: Payer: Self-pay

## 2016-12-16 ENCOUNTER — Ambulatory Visit: Payer: Commercial Managed Care - PPO | Admitting: Family Medicine

## 2016-12-16 VITALS — BP 133/83 | HR 63 | Temp 97.6°F | Resp 16 | Ht 71.0 in | Wt 220.5 lb

## 2016-12-16 DIAGNOSIS — R0781 Pleurodynia: Secondary | ICD-10-CM

## 2016-12-16 DIAGNOSIS — R072 Precordial pain: Secondary | ICD-10-CM

## 2016-12-16 DIAGNOSIS — R079 Chest pain, unspecified: Secondary | ICD-10-CM | POA: Diagnosis not present

## 2016-12-16 NOTE — Patient Instructions (Signed)
Take aleve 220mg : 2 tabs twice a day with food for 7 days.

## 2016-12-16 NOTE — Progress Notes (Signed)
OFFICE VISIT  12/16/2016   CC:  Chief Complaint  Patient presents with  . Chest Pain    when taking a deep breathing   HPI:    Patient is a 56 y.o. African-American male who presents for pain in chest when taking a deep breath. Onset 3 days ago when eating dinner.  Dull ache in center of chest.   Next day felt more at random areas of chest but mostly on L infrapectoral area.  Yesterday felt pain when breathing deep breaths, better today--still dull--no sharp or stabbing pains.  Nothing seems to make it better or worse. No exertional CP.  No nausea, diaphoresis, palpitations, or SOB. No recollection of recent strain but he does do push ups every morning. Denies substernal burning.  Took zantac on first night, no help.  No fevers, no cough. He recalls a 2d flu like illness in the week prior to getting this CP, but this had all resolved by the time he starting getting CP.  He saw Dr. Dr. Sallyanne Kuster for eval of CP 05/31/16 and an ETT was ordered.  Pt had to cancel this once. He asks today if he can now get this test done.   Past Medical History:  Diagnosis Date  . Acquired renal cyst of left kidney 16/0737   Complicated cyst vs solid mass: contrast enhanced CT was recommended but ?apparently not done?  Marland Kitchen Atypical chest pain 05/2016   w/u in ED neg.  Saw Dr. Orene Desanctis for ED f/u and ETT planned but he feels like the pain was likely biliary colic.  Marland Kitchen Cholelithiasis    Noted on  CT abd.  No cholecystitis.  Reconfirmed on RUQ u/s 05/21/16.  Marland Kitchen Chronic renal insufficiency, stage II (mild)    Borderline stage II/III (CrCl estimate @ low 60s)  . History of kidney stones   . Hyperlipidemia   . Hypertension   . Hypogonadism male 02/2010  . Left ureteral stone   . Nephrolithiasis    left non-obstucive per ct 05-25-2015--25mm x 6 mm stone just beyond ureteropelvic jxn in prox L ureter, severe left hydronephrosis noted with kidney edema.  Pt then saw Dr. Alyson Ingles with urol and got ureteroscopy with  stent placement.  . Polycythemia 05/10/2010   EPO level normal-- IDIOPATHIC  . Rectal bleeding 04/2016   Saw GI--dx of outlet bleeding, reassured, constipation tx advised.  . Urgency of urination     Past Surgical History:  Procedure Laterality Date  . COLONOSCOPY  09/30/11   Normal-repeat in 10 yrs  . CYSTOSCOPY WITH STENT PLACEMENT Left 05/29/2015   Procedure: CYSTOSCOPY WITH STENT PLACEMENT, RETROGRADE;  Surgeon: Cleon Gustin, MD;  Location: [facility];  Service: Urology;  Laterality: Left;  . CYSTOSCOPY/RETROGRADE/URETEROSCOPY/STONE EXTRACTION WITH BASKET Left 06/15/2015   Procedure: CYSTOSCOPY/RETROGRADE/URETEROSCOPY/STONE EXTRACTION WITH BASKET WITH STENT EXCHANGE;  Surgeon: Cleon Gustin, MD;  Location: [facility];  Service: Urology;  Laterality: Left;  . HOLMIUM LASER APPLICATION Left 1/0/6269   Procedure: HOLMIUM LASER APPLICATION;  Surgeon: Cleon Gustin, MD;  Location[facility];  Service: Urology;  Laterality: Left;  . KNEE SURGERY Right 2002    Outpatient Medications Prior to Visit  Medication Sig Dispense Refill  . traZODone (DESYREL) 50 MG tablet Take 50 mg by mouth at bedtime as needed.  4  . amLODipine (NORVASC) 10 MG tablet Take 1 tablet (10 mg total) by mouth daily. (Patient not taking: Reported on 12/16/2016) 90 tablet 2  . atorvastatin (LIPITOR) 40 MG  tablet Take 40 mg by mouth daily. Take 1 tablet by mouth daily.    . ANDROGEL PUMP 20.25 MG/ACT (1.62%) GEL APPLY 2 PUMPS DAILY AS DIRECTED  2   No facility-administered medications prior to visit.     No Known Allergies  ROS As per HPI  PE: Blood pressure 133/83, pulse 63, temperature 97.6 F (36.4 C), temperature source Oral, resp. rate 16, height 5\' 11"  (1.803 m), weight 220 lb 8 oz (100 kg), SpO2 94 %. Gen: Alert, well appearing.  Patient is oriented to person, place, time, and situation. AFFECT: pleasant, lucid thought and speech. WSF:KCLE: no injection, icteris,  swelling, or exudate.  EOMI, PERRLA. Mouth: lips without lesion/swelling.  Oral mucosa pink and moist. Oropharynx without erythema, exudate, or swelling.  CV: RRR, no m/r/g.   LUNGS: CTA bilat, nonlabored resps, good aeration in all lung fields. No chest wall tenderness to palpation. ABD: soft, NT/ND EXT: no clubbing, cyanosis, or edema.   LABS:    Chemistry      Component Value Date/Time   NA 138 05/21/2016 1822   K 4.0 05/21/2016 1822   CL 106 05/21/2016 1822   CO2 24 05/21/2016 1822   BUN 22 (H) 05/21/2016 1822   CREATININE 1.38 (H) 05/21/2016 1822      Component Value Date/Time   CALCIUM 9.5 05/21/2016 1822   ALKPHOS 53 04/05/2012 2209   AST 21 04/05/2012 2209   ALT 23 04/05/2012 2209   BILITOT 0.2 (L) 04/05/2012 2209     GFR 05/2015= 57 ml/min  IMPRESSION AND PLAN:  Precordial and L inferior chest pain; very low suspicion that this is of cardiac etiology. Very possibly pleuritic CP, as pt describes a brief preceding flu-like illness. I will check a cXR today. I recommended he take 440 mg naproxen bid with food x 7d. In review of his orders, it appears his ETT order from Dr. Orene Desanctis from 05/2016 is still valid, so I recommended he call to reschedule this procedure. He'll call us or his cardiologist's office if he has trouble doing this.  An After Visit Summary was printed and given to the patient.  FOLLOW UP: Return if symptoms worsen or fail to improve.  Signed:  Crissie Sickles, MD           12/16/2016

## 2017-07-02 DIAGNOSIS — M542 Cervicalgia: Secondary | ICD-10-CM | POA: Diagnosis not present

## 2017-07-02 DIAGNOSIS — S46811A Strain of other muscles, fascia and tendons at shoulder and upper arm level, right arm, initial encounter: Secondary | ICD-10-CM | POA: Diagnosis not present

## 2017-07-29 DIAGNOSIS — M542 Cervicalgia: Secondary | ICD-10-CM | POA: Diagnosis not present

## 2017-07-29 DIAGNOSIS — M5412 Radiculopathy, cervical region: Secondary | ICD-10-CM | POA: Diagnosis not present

## 2017-07-29 DIAGNOSIS — M25511 Pain in right shoulder: Secondary | ICD-10-CM | POA: Diagnosis not present

## 2017-07-29 DIAGNOSIS — M7541 Impingement syndrome of right shoulder: Secondary | ICD-10-CM | POA: Diagnosis not present

## 2017-09-01 LAB — TSH: TSH: 1.43 (ref 0.41–5.90)

## 2017-09-01 LAB — HEPATIC FUNCTION PANEL
ALK PHOS: 49 (ref 25–125)
ALT: 27 (ref 10–40)
AST: 18 (ref 14–40)
BILIRUBIN, TOTAL: 0.3

## 2017-09-01 LAB — BASIC METABOLIC PANEL
BUN: 21 (ref 4–21)
Creatinine: 1.4 — AB (ref 0.6–1.3)
GLUCOSE: 88
Potassium: 4.1 (ref 3.4–5.3)
Sodium: 145 (ref 137–147)

## 2017-09-01 LAB — LIPID PANEL
Cholesterol: 227 — AB (ref 0–200)
HDL: 50 (ref 35–70)
LDL CALC: 157
TRIGLYCERIDES: 100 (ref 40–160)

## 2017-09-01 LAB — CBC AND DIFFERENTIAL
HCT: 51 (ref 41–53)
Hemoglobin: 16.8 (ref 13.5–17.5)
NEUTROS ABS: 3
PLATELETS: 157 (ref 150–399)
WBC: 5.2

## 2017-09-01 LAB — PSA: PSA: 2.2

## 2017-09-01 LAB — HEMOGLOBIN A1C: Hemoglobin A1C: 5.5

## 2017-09-02 LAB — LAB REPORT - SCANNED
TESTOSTERONE FREE: 6.8
TESTOSTERONE: 301

## 2017-09-03 ENCOUNTER — Ambulatory Visit: Payer: Commercial Managed Care - PPO | Admitting: Family Medicine

## 2017-09-04 ENCOUNTER — Encounter: Payer: Self-pay | Admitting: Family Medicine

## 2017-09-04 ENCOUNTER — Ambulatory Visit: Payer: Commercial Managed Care - PPO | Admitting: Family Medicine

## 2017-09-04 VITALS — BP 146/92 | HR 84 | Resp 16 | Ht 71.0 in | Wt 215.0 lb

## 2017-09-04 DIAGNOSIS — R51 Headache: Secondary | ICD-10-CM | POA: Diagnosis not present

## 2017-09-04 DIAGNOSIS — E870 Hyperosmolality and hypernatremia: Secondary | ICD-10-CM | POA: Diagnosis not present

## 2017-09-04 DIAGNOSIS — I10 Essential (primary) hypertension: Secondary | ICD-10-CM | POA: Diagnosis not present

## 2017-09-04 DIAGNOSIS — R519 Headache, unspecified: Secondary | ICD-10-CM

## 2017-09-04 MED ORDER — LISINOPRIL 10 MG PO TABS
10.0000 mg | ORAL_TABLET | Freq: Every day | ORAL | 0 refills | Status: DC
Start: 2017-09-04 — End: 2017-12-16

## 2017-09-04 MED ORDER — KETOROLAC TROMETHAMINE 60 MG/2ML IM SOLN
60.0000 mg | Freq: Once | INTRAMUSCULAR | Status: AC
Start: 2017-09-04 — End: 2017-09-04
  Administered 2017-09-04: 60 mg via INTRAMUSCULAR

## 2017-09-04 NOTE — Progress Notes (Signed)
Patrick Campbell , 1960/03/30, 57 y.o., male MRN: [id number] Patient Care Team    Relationship Specialty Notifications Start End  McGowen, Adrian Blackwater, MD PCP - General Family Medicine  05/09/10   Rana Snare, MD Consulting Physician Urology  01/30/14   Jerene Bears, MD Consulting Physician Gastroenterology  05/09/16     Chief Complaint  Patient presents with  . Headache    elevated blood pressures     Subjective: Pt presents for an OV with complaints of elevated BP and headache of 2 days duration. He was told his BP was borderline at his employer well visit 4 days ago. Pt reports frontal headache for 2 days that improves with NSAID use and then returns. He denies nausea, vomit, dizziness, photophobia, Bowel changes, blurry vision, snoring, daytime fatigue, or Upper resp symptoms. He reports he is not sleeping well. He has been out of his trazodone - just has not picked it up yet. He admits to eye strain and last eye exam/script > 2 years ago. He had his "wellness" exam at his employment 09/01/2017 and brings in labs today which were reviewed and abstracted into his EMR. CBC WNL, CMP stable w/ GFR 58, his sodium was mildly elevated at 145 (144 nl), TSH WNL. He has been drinking a great deal of coconut water a day, he does not drink regular water. He also knows he is consuming higher salt meals. He also endorses mild LUQ pain yesterday, that has resolved.   No flowsheet data found.  No Known Allergies Social History   Tobacco Use  . Smoking status: Never Smoker  . Smokeless tobacco: Never Used  Substance Use Topics  . Alcohol use: Yes    Alcohol/week: 3.0 - 4.0 standard drinks    Types: 3 - 4 Glasses of wine per week    Comment: 2 glasses of wine 2 days a week   Past Medical History:  Diagnosis Date  . Acquired renal cyst of left kidney 40/9811   Complicated cyst vs solid mass: contrast enhanced CT was recommended but ?apparently not done?  Marland Kitchen Atypical chest pain 05/2016   w/u in ED  neg.  Saw Dr. Orene Desanctis for ED f/u and ETT planned but he feels like the pain was likely biliary colic.  Marland Kitchen Cholelithiasis    Noted on  CT abd.  No cholecystitis.  Reconfirmed on RUQ u/s 05/21/16.  Marland Kitchen Chronic renal insufficiency, stage II (mild)    Borderline stage II/III (CrCl estimate @ low 60s)  . History of kidney stones   . Hyperlipidemia   . Hypertension   . Hypogonadism male 02/2010  . Left ureteral stone   . Nephrolithiasis    left non-obstucive per ct 05-25-2015--42mm x 6 mm stone just beyond ureteropelvic jxn in prox L ureter, severe left hydronephrosis noted with kidney edema.  Pt then saw Dr. Alyson Ingles with urol and got ureteroscopy with stent placement.  . Polycythemia 05/10/2010   EPO level normal-- IDIOPATHIC  . Rectal bleeding 04/2016   Saw GI--dx of outlet bleeding, reassured, constipation tx advised.  . Urgency of urination    Past Surgical History:  Procedure Laterality Date  . COLONOSCOPY  09/30/11   Normal-repeat in 10 yrs  . CYSTOSCOPY WITH STENT PLACEMENT Left 05/29/2015   Procedure: CYSTOSCOPY WITH STENT PLACEMENT, RETROGRADE;  Surgeon: Cleon Gustin, MD;  Location: [facility];  Service: Urology;  Laterality: Left;  . CYSTOSCOPY/RETROGRADE/URETEROSCOPY/STONE EXTRACTION WITH BASKET Left 06/15/2015   Procedure: CYSTOSCOPY/RETROGRADE/URETEROSCOPY/STONE EXTRACTION WITH BASKET WITH STENT  EXCHANGE;  Surgeon: Cleon Gustin, MD;  Location[facility];  Service: Urology;  Laterality: Left;  . HOLMIUM LASER APPLICATION Left 07/16/7104   Procedure: HOLMIUM LASER APPLICATION;  Surgeon: Cleon Gustin, MD;  Location: [facility];  Service: Urology;  Laterality: Left;  . KNEE SURGERY Right 2002   Family History  Problem Relation Age of Onset  . Hypertension Mother   . Cancer Father        lung/ smoker  . Kidney disease Brother   . HIV Brother    Allergies as of 09/04/2017   No Known Allergies     Medication List        Accurate as of  09/04/17 11:13 AM. Always use your most recent med list.          amLODipine 10 MG tablet Commonly known as:  NORVASC Take 1 tablet (10 mg total) by mouth daily.   atorvastatin 40 MG tablet Commonly known as:  LIPITOR Take 40 mg by mouth daily. Take 1 tablet by mouth daily.   Ibuprofen 200 MG Caps ibuprofen   lisinopril 10 MG tablet Commonly known as:  PRINIVIL,ZESTRIL Take 1 tablet (10 mg total) by mouth daily.   traZODone 50 MG tablet Commonly known as:  DESYREL Take 50 mg by mouth at bedtime as needed.       All past medical history, surgical history, allergies, family history, immunizations andmedications were updated in the EMR today and reviewed under the history and medication portions of their EMR.     ROS: Negative, with the exception of above mentioned in HPI   Objective:  BP (!) 146/92 (BP Location: Right Arm, Patient Position: Sitting, Cuff Size: Normal)   Pulse 84   Resp 16   Ht 5\' 11"  (1.803 m)   Wt 215 lb (97.5 kg)   SpO2 96%   BMI 29.99 kg/m  Body mass index is 29.99 kg/m. Gen: Afebrile. No acute distress. Nontoxic in appearance, well developed, well nourished. Pleasant AAM.  HENT: AT. [place]. Bilateral TM visualized without erythema or fullness. MMM, no oral lesions. Bilateral nares without erythema or swelling. Throat without erythema or exudates. No cough or hoarseness.  Eyes:Pupils Equal Round Reactive to light, Extraocular movements intact,  Conjunctiva without redness, discharge or icterus. Neck/lymp/endocrine: Supple,no lymphadenopathy CV: RRR no murmur, trace edema Chest: CTAB, no wheeze or crackles. Good air movement, normal resp effort.   Neuro:  Normal gait. PERLA. EOMi. Alert. Oriented x3  Psych: Normal affect, dress and demeanor. Normal speech. Normal thought content and judgment.  No exam data present No results found. No results found for this or any previous visit (from the past 24 hour(s)).  Assessment/Plan: Patrick Campbell is a 57  y.o. male present for OV for  HTN (hypertension), benign/headache - BP elevated above goal today. Exam is normal.  He had been on amlodipine and lisinopril in the past, he is uncertain why he stopped the lisinopril.  - Headache possibly multifactorial with borderline BP, lack of sleep, eye strain and rebound headache. Discussed rebound and tension headaches today. AVS info provided on both.  - toradol inj provided today. Encouraged to go home take a benadryl and sleep.      - Avoid further nsaids if able.      - Hydrate with WATER. Low salt diet. Sodium is higher than desired at 145 (nl 144)     - encouraged to schedule with eye doc for updated exam.  - Continue amlodipine  10 mg QD--> he gets scripts through his employer NP. - Start lisinopril (PRINIVIL,ZESTRIL) 10 MG tablet; Take 1 tablet (10 mg total) by mouth daily.  Dispense: 90 tablet; Refill: 0--> future scripts from employer or PCP.  - ketorolac (TORADOL) injection 60 mg - F/U pcp 2-3 weeks for BP and headaches, sooner if worsening. May also want to repeat BMP to recheck sodium.   Reviewed expectations re: course of current medical issues.  Discussed self-management of symptoms.  Outlined signs and symptoms indicating need for more acute intervention.  Patient verbalized understanding and all questions were answered.  Patient received an After-Visit Summary.  > 25 minutes spent with patient, >50% of time spent face to face counseling and coordinating care.      No orders of the defined types were placed in this encounter.    Note is dictated utilizing voice recognition software. Although note has been proof read prior to signing, occasional typographical errors still can be missed. If any questions arise, please do not hesitate to call for verification.   electronically signed by:  Howard Pouch, DO  [place]

## 2017-09-04 NOTE — Patient Instructions (Signed)
Cut back on coconut water and salt. Drink more Water. You can continue to take amlodipine at night. Start lisinopril in the morning.   Toradol injection today to help with headache.  Get a massage.  Get your eyes checked.   Follow up with PCP in 2-4 weeks for recheck on Blood pressure and headache, sooner if headache is not resolved.   Analgesic Rebound Headache An analgesic rebound headache, sometimes called a medication overuse headache, is a headache that comes after pain medicine (analgesic) taken to treat the original (primary) headache has worn off. Any type of primary headache can return as a rebound headache if a person regularly takes analgesics more than three times a week to treat it. The types of primary headaches that are commonly associated with rebound headaches include:  Migraines.  Headaches that arise from tense muscles in the head and neck area (tension headaches).  Headaches that develop and happen again (recur) on one side of the head and around the eye (cluster headaches).  If rebound headaches continue, they become chronic daily headaches. What are the causes? This condition may be caused by frequent use of:  Over-the-counter medicines such as aspirin, ibuprofen, and acetaminophen.  Sinus relief medicines and other medicines that contain caffeine.  Narcotic pain medicines such as codeine and oxycodone.  What are the signs or symptoms? The symptoms of a rebound headache are the same as the symptoms of the original headache. Some of the symptoms of specific types of headaches include: Migraine headache  Pulsing or throbbing pain on one or both sides of the head.  Severe pain that interferes with daily activities.  Pain that is worsened by physical activity.  Nausea, vomiting, or both.  Pain with exposure to bright light, loud noises, or strong smells.  General sensitivity to bright light, loud noises, or strong smells.  Visual changes.  Numbness of  one or both arms. Tension headache  Pressure around the head.  Dull, aching head pain.  Pain felt over the front and sides of the head.  Tenderness in the muscles of the head, neck, and shoulders. Cluster headache  Severe pain that begins in or around one eye or temple.  Redness and tearing in the eye on the same side as the pain.  Droopy or swollen eyelid.  One-sided head pain.  Nausea.  Runny nose.  Sweaty, pale facial skin.  Restlessness. How is this diagnosed? This condition is diagnosed by:  Reviewing your medical history. This includes the nature of your primary headaches.  Reviewing the types of pain medicines that you have been using to treat your headaches and how often you take them.  How is this treated? This condition may be treated or managed by:  Discontinuing frequent use of the analgesic medicine. Doing this may worsen your headaches at first, but the pain should eventually become more manageable, less frequent, and less severe.  Seeing a headache specialist. He or she may be able to help you manage your headaches and help make sure there is not another cause of the headaches.  Using methods of stress relief, such as acupuncture, counseling, biofeedback, and massage. Talk with your health care provider about which methods might be good for you.  Follow these instructions at home:  Take over-the-counter and prescription medicines only as told by your health care provider.  Stop the repeated use of pain medicine as told by your health care provider. Stopping can be difficult. Carefully follow instructions from your health care provider.  Avoid triggers that are known to cause your primary headaches.  Keep all follow-up visits as told by your health care provider. This is important. Contact a health care provider if:  You continue to experience headaches after following treatments that your health care provider recommended. Get help right away  if:  You develop new headache pain.  You develop headache pain that is different than what you have experienced in the past.  You develop numbness or tingling in your arms or legs.  You develop changes in your speech or vision. This information is not intended to replace advice given to you by your health care provider. Make sure you discuss any questions you have with your health care provider. Document Released: 03/23/2003 Document Revised: 07/21/2015 Document Reviewed: 06/05/2015 Elsevier Interactive Patient Education  2018 [employer].   Tension Headache A tension headache is pain, pressure, or aching that is felt over the front and sides of your head. These headaches can last from 30 minutes to several days. Follow these instructions at home: Managing pain  Take over-the-counter and prescription medicines only as told by your doctor.  Lie down in a dark, quiet room when you have a headache.  If directed, apply ice to your head and neck area: ? Put ice in a plastic bag. ? Place a towel between your skin and the bag. ? Leave the ice on for 20 minutes, 2-3 times per day.  Use a heating pad or a hot shower to apply heat to your head and neck area as told by your doctor. Eating and drinking  Eat meals on a regular schedule.  Do not drink a lot of alcohol.  Do not use a lot of caffeine, or stop using caffeine. General instructions  Keep all follow-up visits as told by your doctor. This is important.  Keep a journal to find out if certain things bring on headaches. For example, write down: ? What you eat and drink. ? How much sleep you get. ? Any change to your diet or medicines.  Try getting a massage, or doing other things that help you to relax.  Lessen stress.  Sit up straight. Do not tighten (tense) your muscles.  Do not use tobacco products. This includes cigarettes, chewing tobacco, or e-cigarettes. If you need help quitting, ask your doctor.  Exercise  regularly as told by your doctor.  Get enough sleep. This may mean 7-9 hours of sleep. Contact a doctor if:  Your symptoms are not helped by medicine.  You have a headache that feels different from your usual headache.  You feel sick to your stomach (nauseous) or you throw up (vomit).  You have a fever. Get help right away if:  Your headache becomes very bad.  You keep throwing up.  You have a stiff neck.  You have trouble seeing.  You have trouble speaking.  You have pain in your eye or ear.  Your muscles are weak or you lose muscle control.  You lose your balance or you have trouble walking.  You feel like you will pass out (faint) or you pass out.  You have confusion. This information is not intended to replace advice given to you by your health care provider. Make sure you discuss any questions you have with your health care provider. Document Released: 03/27/2009 Document Revised: 08/31/2015 Document Reviewed: 04/25/2014 Elsevier Interactive Patient Education  [employer].

## 2017-09-24 ENCOUNTER — Ambulatory Visit: Payer: Commercial Managed Care - PPO | Admitting: Family Medicine

## 2017-09-24 NOTE — Progress Notes (Deleted)
OFFICE VISIT  09/24/2017   CC: No chief complaint on file.    HPI:    Patient is a 57 y.o. African-American male who presents for 3 week f/u HTN. He was having some HA's and elevated bp's, had been taking only amlodipine (and not his lisinopril-->unclear why) and not eating/drinking right. He was given a shot of toradol in our office and restarted on lisinopril 10mg  and continued on amlodipine 10mg  qd. Labs from wellness check at his employers were reviewed by Dr. Raoul Pitch at visit 3 wks ago and again by myself today.   Past Medical History:  Diagnosis Date  . Acquired renal cyst of left kidney 30/8657   Complicated cyst vs solid mass: contrast enhanced CT was recommended but ?apparently not done?  Marland Kitchen Atypical chest pain 05/2016   w/u in ED neg.  Saw Dr. Orene Desanctis for ED f/u and ETT planned but MD like the pain was likely biliary colic.  Pt never got the ETT.  Marland Kitchen Cholelithiasis    Noted on  CT abd.  No cholecystitis.  Reconfirmed on RUQ u/s 05/21/16.  Marland Kitchen Chronic renal insufficiency, stage II (mild)    Borderline stage II/III (CrCl estimate @ low 60s)  . History of kidney stones   . Hyperlipidemia   . Hypertension   . Hypogonadism male 02/2010  . Left ureteral stone   . Nephrolithiasis    left non-obstucive per ct 05-25-2015--39mm x 6 mm stone just beyond ureteropelvic jxn in prox L ureter, severe left hydronephrosis noted with kidney edema.  Pt then saw Dr. Alyson Ingles with urol and got ureteroscopy with stent placement.  . Polycythemia 05/10/2010   EPO level normal-- IDIOPATHIC  . Rectal bleeding 04/2016   Saw GI--dx of outlet bleeding, reassured, constipation tx advised.  . Urgency of urination     Past Surgical History:  Procedure Laterality Date  . COLONOSCOPY  09/30/11   Normal-repeat in 10 yrs  . CYSTOSCOPY WITH STENT PLACEMENT Left 05/29/2015   Procedure: CYSTOSCOPY WITH STENT PLACEMENT, RETROGRADE;  Surgeon: Cleon Gustin, MD;  Location: [facility];  Service: Urology;   Laterality: Left;  . CYSTOSCOPY/RETROGRADE/URETEROSCOPY/STONE EXTRACTION WITH BASKET Left 06/15/2015   Procedure: CYSTOSCOPY/RETROGRADE/URETEROSCOPY/STONE EXTRACTION WITH BASKET WITH STENT EXCHANGE;  Surgeon: Cleon Gustin, MD;  Location: [facility];  Service: Urology;  Laterality: Left;  . HOLMIUM LASER APPLICATION Left 08/17/6960   Procedure: HOLMIUM LASER APPLICATION;  Surgeon: Cleon Gustin, MD;  Location: [facility];  Service: Urology;  Laterality: Left;  . KNEE SURGERY Right 2002    Outpatient Medications Prior to Visit  Medication Sig Dispense Refill  . amLODipine (NORVASC) 10 MG tablet Take 1 tablet (10 mg total) by mouth daily. 90 tablet 2  . atorvastatin (LIPITOR) 40 MG tablet Take 40 mg by mouth daily. Take 1 tablet by mouth daily.    . Ibuprofen 200 MG CAPS ibuprofen    . lisinopril (PRINIVIL,ZESTRIL) 10 MG tablet Take 1 tablet (10 mg total) by mouth daily. 90 tablet 0  . traZODone (DESYREL) 50 MG tablet Take 50 mg by mouth at bedtime as needed.  4   No facility-administered medications prior to visit.     No Known Allergies  ROS As per HPI  PE: There were no vitals taken for this visit. ***  LABS:  Lab Results  Component Value Date   TSH 1.43 09/01/2017   Lab Results  Component Value Date   WBC 5.2 09/01/2017   HGB 16.8 09/01/2017  HCT 51 09/01/2017   MCV 89.3 05/21/2016   PLT 157 09/01/2017   Lab Results  Component Value Date   CREATININE 1.4 (A) 09/01/2017   BUN 21 09/01/2017   NA 145 09/01/2017   K 4.1 09/01/2017   CL 106 05/21/2016   CO2 24 05/21/2016   Lab Results  Component Value Date   ALT 27 09/01/2017   AST 18 09/01/2017   ALKPHOS 49 09/01/2017   BILITOT 0.2 (L) 04/05/2012   Lab Results  Component Value Date   CHOL 227 (A) 09/01/2017   Lab Results  Component Value Date   HDL 50 09/01/2017   Lab Results  Component Value Date   LDLCALC 157 09/01/2017   Lab Results  Component Value Date    TRIG 100 09/01/2017   Lab Results  Component Value Date   CHOLHDL 4 07/22/2011   Lab Results  Component Value Date   PSA 2.2 09/01/2017   PSA 0.75 07/22/2011   Lab Results  Component Value Date   HGBA1C 5.5 09/01/2017    IMPRESSION AND PLAN:  No problem-specific Assessment & Plan notes found for this encounter.   An After Visit Summary was printed and given to the patient.  FOLLOW UP: No follow-ups on file.

## 2017-09-26 ENCOUNTER — Ambulatory Visit: Payer: Commercial Managed Care - PPO | Admitting: Family Medicine

## 2017-09-26 NOTE — Progress Notes (Deleted)
OFFICE VISIT  09/26/2017   CC: No chief complaint on file.    HPI:    Patient is a 57 y.o. African-American male who presents for f/u HTN. At visit here 3 wks ago Dr. Raoul Pitch got him back on his 10mg  lisinopril to continue with his 10mg  amlodipine.     Past Medical History:  Diagnosis Date  . Acquired renal cyst of left kidney 03/5463   Complicated cyst vs solid mass: contrast enhanced CT was recommended but ?apparently not done?  Marland Kitchen Atypical chest pain 05/2016   w/u in ED neg.  Saw Dr. Orene Desanctis for ED f/u and ETT planned but MD like the pain was likely biliary colic.  Pt never got the ETT.  Marland Kitchen Cholelithiasis    Noted on  CT abd.  No cholecystitis.  Reconfirmed on RUQ u/s 05/21/16.  Marland Kitchen Chronic renal insufficiency, stage II (mild)    Borderline stage II/III (CrCl estimate @ low 60s)  . History of kidney stones   . Hyperlipidemia   . Hypertension   . Hypogonadism male 02/2010  . Left ureteral stone   . Nephrolithiasis    left non-obstucive per ct 05-25-2015--5mm x 6 mm stone just beyond ureteropelvic jxn in prox L ureter, severe left hydronephrosis noted with kidney edema.  Pt then saw Dr. Alyson Ingles with urol and got ureteroscopy with stent placement.  . Polycythemia 05/10/2010   EPO level normal-- IDIOPATHIC  . Rectal bleeding 04/2016   Saw GI--dx of outlet bleeding, reassured, constipation tx advised.  . Urgency of urination     Past Surgical History:  Procedure Laterality Date  . COLONOSCOPY  09/30/11   Normal-repeat in 10 yrs  . CYSTOSCOPY WITH STENT PLACEMENT Left 05/29/2015   Procedure: CYSTOSCOPY WITH STENT PLACEMENT, RETROGRADE;  Surgeon: Cleon Gustin, MD;  Location: [facility];  Service: Urology;  Laterality: Left;  . CYSTOSCOPY/RETROGRADE/URETEROSCOPY/STONE EXTRACTION WITH BASKET Left 06/15/2015   Procedure: CYSTOSCOPY/RETROGRADE/URETEROSCOPY/STONE EXTRACTION WITH BASKET WITH STENT EXCHANGE;  Surgeon: Cleon Gustin, MD;  Location: [facility];  Service:  Urology;  Laterality: Left;  . HOLMIUM LASER APPLICATION Left 06/21/1273   Procedure: HOLMIUM LASER APPLICATION;  Surgeon: Cleon Gustin, MD;  Location: [facility];  Service: Urology;  Laterality: Left;  . KNEE SURGERY Right 2002    Outpatient Medications Prior to Visit  Medication Sig Dispense Refill  . amLODipine (NORVASC) 10 MG tablet Take 1 tablet (10 mg total) by mouth daily. 90 tablet 2  . atorvastatin (LIPITOR) 40 MG tablet Take 40 mg by mouth daily. Take 1 tablet by mouth daily.    . Ibuprofen 200 MG CAPS ibuprofen    . lisinopril (PRINIVIL,ZESTRIL) 10 MG tablet Take 1 tablet (10 mg total) by mouth daily. 90 tablet 0  . traZODone (DESYREL) 50 MG tablet Take 50 mg by mouth at bedtime as needed.  4   No facility-administered medications prior to visit.     No Known Allergies  ROS As per HPI  PE: There were no vitals taken for this visit. ***  LABS:    Chemistry      Component Value Date/Time   NA 145 09/01/2017   K 4.1 09/01/2017   CL 106 05/21/2016 1822   CO2 24 05/21/2016 1822   BUN 21 09/01/2017   CREATININE 1.4 (A) 09/01/2017   CREATININE 1.38 (H) 05/21/2016 1822   GLU 88 09/01/2017      Component Value Date/Time   CALCIUM 9.5 05/21/2016 1822   ALKPHOS 49 09/01/2017  AST 18 09/01/2017   ALT 27 09/01/2017   BILITOT 0.2 (L) 04/05/2012 2209     Lab Results  Component Value Date   CHOL 227 (A) 09/01/2017   HDL 50 09/01/2017   LDLCALC 157 09/01/2017   TRIG 100 09/01/2017   CHOLHDL 4 07/22/2011    IMPRESSION AND PLAN:  No problem-specific Assessment & Plan notes found for this encounter.   An After Visit Summary was printed and given to the patient.  FOLLOW UP: No follow-ups on file.  Signed:  Crissie Sickles, MD           09/26/2017

## 2017-10-17 ENCOUNTER — Ambulatory Visit: Payer: Commercial Managed Care - PPO | Admitting: Family Medicine

## 2017-10-17 ENCOUNTER — Encounter: Payer: Self-pay | Admitting: Family Medicine

## 2017-10-17 VITALS — BP 117/75 | HR 85 | Temp 98.4°F | Resp 16 | Ht 71.0 in | Wt 220.4 lb

## 2017-10-17 DIAGNOSIS — J069 Acute upper respiratory infection, unspecified: Secondary | ICD-10-CM

## 2017-10-17 DIAGNOSIS — J209 Acute bronchitis, unspecified: Secondary | ICD-10-CM

## 2017-10-17 MED ORDER — METHYLPREDNISOLONE ACETATE 80 MG/ML IJ SUSP
80.0000 mg | Freq: Once | INTRAMUSCULAR | Status: AC
  Administered 2017-10-17: 80 mg via INTRAMUSCULAR

## 2017-10-17 MED ORDER — ALBUTEROL SULFATE HFA 108 (90 BASE) MCG/ACT IN AERS: INHALATION_SPRAY | RESPIRATORY_TRACT | 0 refills | Status: DC

## 2017-10-17 NOTE — Progress Notes (Signed)
OFFICE VISIT  10/17/2017   CC:  Chief Complaint  Patient presents with  . Chest Congestion    chest hurts   HPI:    Patient is a 57 y.o. African-American male who presents for respiratory symptoms. Onset 4-5, sore throat and nasal congestion, coughing a lot since yesterday, says chest feels sore to cough and to take deep breaths.  No sharp chest pains, no exertional CP.  No fever. HA initially but this went a away.  Cough is nonproductive.  No wheezing but a little rattle has been felt intermittently. No SOB. Of note, at an 09/04/17 visit with Dr. Raoul Pitch for HA's and increased BP's, his lisinopril was restarted. He did not f/u as instructed after that visit.   Past Medical History:  Diagnosis Date  . Acquired renal cyst of left kidney 46/9629   Complicated cyst vs solid mass: contrast enhanced CT was recommended but ?apparently not done?  Marland Kitchen Atypical chest pain 05/2016   w/u in ED neg.  Saw Dr. Orene Desanctis for ED f/u and ETT planned but MD like the pain was likely biliary colic.  Pt never got the ETT.  Marland Kitchen Cholelithiasis    Noted on  CT abd.  No cholecystitis.  Reconfirmed on RUQ u/s 05/21/16.  Marland Kitchen Chronic renal insufficiency, stage II (mild)    Borderline stage II/III (CrCl estimate @ low 60s)  . History of kidney stones   . Hyperlipidemia   . Hypertension   . Hypogonadism male 02/2010  . Left ureteral stone   . Nephrolithiasis    left non-obstucive per ct 05-25-2015--2mm x 6 mm stone just beyond ureteropelvic jxn in prox L ureter, severe left hydronephrosis noted with kidney edema.  Pt then saw Dr. Alyson Ingles with urol and got ureteroscopy with stent placement.  . Polycythemia 05/10/2010   EPO level normal-- IDIOPATHIC  . Rectal bleeding 04/2016   Saw GI--dx of outlet bleeding, reassured, constipation tx advised.  . Urgency of urination     Past Surgical History:  Procedure Laterality Date  . COLONOSCOPY  09/30/11   Normal-repeat in 10 yrs  . CYSTOSCOPY WITH STENT PLACEMENT Left  05/29/2015   Procedure: CYSTOSCOPY WITH STENT PLACEMENT, RETROGRADE;  Surgeon: Cleon Gustin, MD;  Location: [facility];  Service: Urology;  Laterality: Left;  . CYSTOSCOPY/RETROGRADE/URETEROSCOPY/STONE EXTRACTION WITH BASKET Left 06/15/2015   Procedure: CYSTOSCOPY/RETROGRADE/URETEROSCOPY/STONE EXTRACTION WITH BASKET WITH STENT EXCHANGE;  Surgeon: Cleon Gustin, MD;  Location: [facility];  Service: Urology;  Laterality: Left;  . HOLMIUM LASER APPLICATION Left 05/16/8411   Procedure: HOLMIUM LASER APPLICATION;  Surgeon: Cleon Gustin, MD;  Location: [facility];  Service: Urology;  Laterality: Left;  . KNEE SURGERY Right 2002    Outpatient Medications Prior to Visit  Medication Sig Dispense Refill  . amLODipine (NORVASC) 10 MG tablet Take 1 tablet (10 mg total) by mouth daily. 90 tablet 2  . atorvastatin (LIPITOR) 40 MG tablet Take 40 mg by mouth daily. Take 1 tablet by mouth daily.    . Ibuprofen 200 MG CAPS ibuprofen    . lisinopril (PRINIVIL,ZESTRIL) 10 MG tablet Take 1 tablet (10 mg total) by mouth daily. 90 tablet 0  . traZODone (DESYREL) 50 MG tablet Take 50 mg by mouth at bedtime as needed.  4   No facility-administered medications prior to visit.     No Known Allergies  ROS As per HPI  PE: Blood pressure 117/75, pulse 85, temperature 98.4 F (36.9 C), temperature source Oral, resp. rate  16, height 5\' 11"  (1.803 m), weight 220 lb 6 oz (100 kg), SpO2 95 %. VS: noted--normal. Gen: alert, NAD, NONTOXIC APPEARING. HEENT: eyes without injection, drainage, or swelling.  Ears: EACs clear, TMs with normal light reflex and landmarks.  Nose: Clear rhinorrhea, with some dried, crusty exudate adherent to mildly injected mucosa.  No purulent d/c.  No paranasal sinus TTP.  No facial swelling.  Throat and mouth without focal lesion.  No pharyngial swelling, erythema, or exudate.   Neck: supple, no LAD.   LUNGS: CTA bilat, nonlabored resps.  He has excessive  coughing with exhalation, mild decreased aeration diffusely on exhalation.  Nonlabored resps.  No wheezing.   CV: RRR, no m/r/g. EXT: no c/c/e SKIN: no rash  LABS:    Chemistry      Component Value Date/Time   NA 145 09/01/2017   K 4.1 09/01/2017   CL 106 05/21/2016 1822   CO2 24 05/21/2016 1822   BUN 21 09/01/2017   CREATININE 1.4 (A) 09/01/2017   CREATININE 1.38 (H) 05/21/2016 1822   GLU 88 09/01/2017      Component Value Date/Time   CALCIUM 9.5 05/21/2016 1822   ALKPHOS 49 09/01/2017   AST 18 09/01/2017   ALT 27 09/01/2017   BILITOT 0.2 (L) 04/05/2012 2209     Lab Results  Component Value Date   WBC 5.2 09/01/2017   HGB 16.8 09/01/2017   HCT 51 09/01/2017   MCV 89.3 05/21/2016   PLT 157 09/01/2017   Lab Results  Component Value Date   TSH 1.43 09/01/2017   Lab Results  Component Value Date   CHOL 227 (A) 09/01/2017   HDL 50 09/01/2017   LDLCALC 157 09/01/2017   TRIG 100 09/01/2017   CHOLHDL 4 07/22/2011    IMPRESSION AND PLAN:  Viral URI with acute bronchitis--RAD component. Depo medrol 80mg  IM today, albuterol HFA rx'd. Nurse educated pt on use of HFA. Get otc generic robitussin DM OR Mucinex DM and use as directed on the packaging for cough and congestion. Use otc generic saline nasal spray 2-3 times per day to irrigate/moisturize your nasal passages. Fluids, rest.  An After Visit Summary was printed and given to the patient.  FOLLOW UP: Return if symptoms worsen or fail to improve.  Signed:  Crissie Sickles, MD           10/17/2017

## 2017-10-17 NOTE — Patient Instructions (Signed)
Get otc generic robitussin DM OR Mucinex DM and use as directed on the packaging for cough and congestion. Use otc generic saline nasal spray 2-3 times per day to irrigate/moisturize your nasal passages.   

## 2017-11-20 ENCOUNTER — Ambulatory Visit: Payer: Commercial Managed Care - PPO | Admitting: Family Medicine

## 2017-11-20 ENCOUNTER — Encounter: Payer: Self-pay | Admitting: Family Medicine

## 2017-11-20 VITALS — BP 126/86 | HR 83 | Temp 97.5°F | Resp 20 | Ht 71.0 in | Wt 215.6 lb

## 2017-11-20 DIAGNOSIS — L500 Allergic urticaria: Secondary | ICD-10-CM | POA: Diagnosis not present

## 2017-11-20 MED ORDER — METHYLPREDNISOLONE ACETATE 80 MG/ML IJ SUSP
80.0000 mg | Freq: Once | INTRAMUSCULAR | Status: AC
  Administered 2017-11-20: 80 mg via INTRAMUSCULAR

## 2017-11-20 MED ORDER — PREDNISONE 5 MG PO TABS: ORAL_TABLET | ORAL | 0 refills | Status: DC

## 2017-11-20 NOTE — Progress Notes (Signed)
OFFICE VISIT  11/20/2017   CC:  Chief Complaint  Patient presents with  . Urticaria    x 2 days   HPI:    Patient is a 57 y.o. African-American male who presents for hives. Onset 2 d/a about 30 min after eating salmon-->used a new seasoning on it.  Started getting hives on his back and they have gradually spread to cover entire body; including hands and soles of feet.  Hands and feet burn.  Benadryl use multiple times no help.  Bottom lip has been swollen last 24h.  No tongue or throat swelling, no wheezing or SOB or palpitations or dizziness.  No joint swelling.  No fevers.  Past Medical History:  Diagnosis Date  . Acquired renal cyst of left kidney 78/2956   Complicated cyst vs solid mass: contrast enhanced CT was recommended but ?apparently not done?  Marland Kitchen Atypical chest pain 05/2016   w/u in ED neg.  Saw Dr. Orene Desanctis for ED f/u and ETT planned but MD like the pain was likely biliary colic.  Pt never got the ETT.  Marland Kitchen Cholelithiasis    Noted on  CT abd.  No cholecystitis.  Reconfirmed on RUQ u/s 05/21/16.  Marland Kitchen Chronic renal insufficiency, stage II (mild)    Borderline stage II/III (CrCl estimate @ low 60s)  . History of kidney stones   . Hyperlipidemia   . Hypertension   . Hypogonadism male 02/2010  . Left ureteral stone   . Nephrolithiasis    left non-obstucive per ct 05-25-2015--33mm x 6 mm stone just beyond ureteropelvic jxn in prox L ureter, severe left hydronephrosis noted with kidney edema.  Pt then saw Dr. Alyson Ingles with urol and got ureteroscopy with stent placement.  . Polycythemia 05/10/2010   EPO level normal-- IDIOPATHIC  . Rectal bleeding 04/2016   Saw GI--dx of outlet bleeding, reassured, constipation tx advised.  . Urgency of urination     Past Surgical History:  Procedure Laterality Date  . COLONOSCOPY  09/30/11   Normal-repeat in 10 yrs  . CYSTOSCOPY WITH STENT PLACEMENT Left 05/29/2015   Procedure: CYSTOSCOPY WITH STENT PLACEMENT, RETROGRADE;  Surgeon: Cleon Gustin, MD;  Location: [facility];  Service: Urology;  Laterality: Left;  . CYSTOSCOPY/RETROGRADE/URETEROSCOPY/STONE EXTRACTION WITH BASKET Left 06/15/2015   Procedure: CYSTOSCOPY/RETROGRADE/URETEROSCOPY/STONE EXTRACTION WITH BASKET WITH STENT EXCHANGE;  Surgeon: Cleon Gustin, MD;  Location: [facility];  Service: Urology;  Laterality: Left;  . HOLMIUM LASER APPLICATION Left 02/15/3084   Procedure: HOLMIUM LASER APPLICATION;  Surgeon: Cleon Gustin, MD;  Location: [facility];  Service: Urology;  Laterality: Left;  . KNEE SURGERY Right 2002    Outpatient Medications Prior to Visit  Medication Sig Dispense Refill  . albuterol (VENTOLIN HFA) 108 (90 Base) MCG/ACT inhaler 1-2 puffs q4h prn excessive coughing, or if wheezing or feeling short of breath 1 Inhaler 0  . amLODipine (NORVASC) 10 MG tablet Take 1 tablet (10 mg total) by mouth daily. 90 tablet 2  . atorvastatin (LIPITOR) 40 MG tablet Take 40 mg by mouth daily. Take 1 tablet by mouth daily.    . Ibuprofen 200 MG CAPS ibuprofen    . lisinopril (PRINIVIL,ZESTRIL) 10 MG tablet Take 1 tablet (10 mg total) by mouth daily. 90 tablet 0  . traZODone (DESYREL) 50 MG tablet Take 50 mg by mouth at bedtime as needed.  4   No facility-administered medications prior to visit.     No Known Allergies  ROS As per HPI  PE: Blood pressure 126/86, pulse 83, temperature (!) 97.5 F (36.4 C), resp. rate 20, height 5\' 11"  (1.803 m), weight 215 lb 9.6 oz (97.8 kg), SpO2 96 %. Gen: Alert, well appearing.  Patient is oriented to person, place, time, and situation. AFFECT: pleasant, lucid thought and speech. ENT: eyes without erythema or swelling or exudate. One hive near L eyebrow.  Some scattered small (1-2 cm) hives on back, trunk, arms, legs. Palms and soles with diffuse, splotchy erythema and mild swelling, but no focal hives can be discerned. Bottom lip is slightly swollen on R 1/2, with a small area of blood noted  on inner aspect.  No swelling or erythema of gums, tongue, palate, or throat.   CV: RRR, no m/r/g.   LUNGS: CTA bilat, nonlabored resps, good aeration in all lung fields. No edema or joint swelling or joint erythema.  LABS:    Chemistry      Component Value Date/Time   NA 145 09/01/2017   K 4.1 09/01/2017   CL 106 05/21/2016 1822   CO2 24 05/21/2016 1822   BUN 21 09/01/2017   CREATININE 1.4 (A) 09/01/2017   CREATININE 1.38 (H) 05/21/2016 1822   GLU 88 09/01/2017      Component Value Date/Time   CALCIUM 9.5 05/21/2016 1822   ALKPHOS 49 09/01/2017   AST 18 09/01/2017   ALT 27 09/01/2017   BILITOT 0.2 (L) 04/05/2012 2209     Lab Results  Component Value Date   WBC 5.2 09/01/2017   HGB 16.8 09/01/2017   HCT 51 09/01/2017   MCV 89.3 05/21/2016   PLT 157 09/01/2017    IMPRESSION AND PLAN:  Urticaria, possible allergic-->new seasoning/spice on his salmon right before onset of sx's. Minimal angioedema-->lower lip. Depo medrol 80mg  in office today. Prednisone taper--see orders. Allegra 180 qd and pecid 20mg  bid.  If these don't improve or if they resolve and then start up again after steroids are finished, we'll need to consider getting him off his lisinopril.  Signs/symptoms to call or return for were reviewed and pt expressed understanding.  An After Visit Summary was printed and given to the patient.  FOLLOW UP: Return if symptoms worsen or fail to improve.  Signed:  Crissie Sickles, MD           11/20/2017

## 2017-11-20 NOTE — Patient Instructions (Signed)
Buy generic over the counter allegra 180 mg and take one tab per day for itching/hives. Also buy generic over the counter pepcid 20mg , take 1 tab twice a day for itching/hives.

## 2017-11-24 ENCOUNTER — Telehealth: Payer: Self-pay | Admitting: Family Medicine

## 2017-11-24 DIAGNOSIS — L500 Allergic urticaria: Secondary | ICD-10-CM

## 2017-11-24 DIAGNOSIS — L508 Other urticaria: Secondary | ICD-10-CM

## 2017-11-24 NOTE — Telephone Encounter (Signed)
OK, referral to [facility] has been ordered.

## 2017-11-24 NOTE — Telephone Encounter (Signed)
Copied from [place] [id number]. Topic: Referral - Request for Referral >> Nov 24, 2017 11:09 AM Antonieta Iba C wrote: Pt is requesting a referral to dermatology. Pt says that he was seen for a rash/place on his skin. Pt says that it is clearing up but would like to see Dermo to determine why, what caused it to happen. Please assist pt further w/ referral.

## 2017-11-24 NOTE — Telephone Encounter (Signed)
A referral is fine, but the most appropriate specialist to see would be an allergist. If pt agreeable, let me know and I'll order referral.-thx

## 2017-11-24 NOTE — Telephone Encounter (Signed)
Please advise. Thanks.  

## 2017-11-24 NOTE — Telephone Encounter (Signed)
Patient agrees with plan to see allergist, please place referral.

## 2017-12-02 ENCOUNTER — Encounter: Payer: Self-pay | Admitting: Allergy and Immunology

## 2017-12-02 ENCOUNTER — Ambulatory Visit (INDEPENDENT_AMBULATORY_CARE_PROVIDER_SITE_OTHER): Payer: Commercial Managed Care - PPO | Admitting: Allergy and Immunology

## 2017-12-02 VITALS — BP 124/70 | HR 76 | Temp 97.8°F | Resp 18 | Ht 72.0 in | Wt 214.0 lb

## 2017-12-02 DIAGNOSIS — T783XXD Angioneurotic edema, subsequent encounter: Secondary | ICD-10-CM | POA: Diagnosis not present

## 2017-12-02 DIAGNOSIS — J3089 Other allergic rhinitis: Secondary | ICD-10-CM

## 2017-12-02 DIAGNOSIS — T7840XD Allergy, unspecified, subsequent encounter: Secondary | ICD-10-CM | POA: Diagnosis not present

## 2017-12-02 DIAGNOSIS — L500 Allergic urticaria: Secondary | ICD-10-CM

## 2017-12-02 DIAGNOSIS — T783XXA Angioneurotic edema, initial encounter: Secondary | ICD-10-CM | POA: Insufficient documentation

## 2017-12-02 MED ORDER — FLUTICASONE PROPIONATE 50 MCG/ACT NA SUSP: 2.0000 | Freq: Every day | NASAL | 5 refills | Status: DC | PRN

## 2017-12-02 MED ORDER — LEVOCETIRIZINE DIHYDROCHLORIDE 5 MG PO TABS: 5.0000 mg | ORAL_TABLET | Freq: Every evening | ORAL | 5 refills | Status: DC

## 2017-12-02 MED ORDER — EPINEPHRINE 0.3 MG/0.3ML IJ SOAJ: 0.3000 mg | Freq: Once | INTRAMUSCULAR | 2 refills | Status: AC

## 2017-12-02 MED ORDER — MONTELUKAST SODIUM 10 MG PO TABS: 10.0000 mg | ORAL_TABLET | Freq: Every day | ORAL | 5 refills | Status: DC

## 2017-12-02 NOTE — Progress Notes (Signed)
New Patient Note  RE: Patrick Campbell MRN: [id number] DOB: 07/05/1960 Date of Office Visit: 12/02/2017  Referring provider: Tammi Sou, MD Primary care provider: Tammi Sou, MD  Chief Complaint: Urticaria and Allergic Reaction   History of present illness: Patrick Campbell is a 57 y.o. male seen today in consultation requested by Shawnie Dapper, MD.  He reports that on November 5 he consumed salmon with a spice mix and approximately 1 or 2 hours later experience pruritus of the upper extremities and face.  He then noticed hives around his elbows and on his forehead which eventually progressed to generalized urticaria "all over" his body.  He developed mild swelling of the lower lip and states that his hands and his feet became red and "were unusable because of burning and itching."  He denies concomitant cardiopulmonary symptoms and GI symptoms.  He denies joint pain, joint swelling, fevers, and chills.  Over the past several months he has not experienced unexpected weight loss, recurrent fevers, or drenching night sweats.  Two days after the onset of symptoms he saw his primary care physician, Dr. Anitra Lauth, and received Depo-Medrol 80 mg and a prescription for prednisone taper along with instructions to start fexofenadine 180 mg daily and famotidine 20 mg twice daily.  He reports that the hives began to resolve over the course of the next few days. However, the hive recurred.  On this occasion he did not experience associated angioedema nor were there concomitant cardiopulmonary or GI symptoms.  He states that he had consumed pistachio a couple hours prior to symptom onset, otherwise is unable to identify any potential triggers.  He did not have symptoms consistent with viral syndrome at the time of symptom onset but recalls having had a 3 or 4-day bout of sore throat and body aches which occurred 2 weeks prior to onset of the hives.  He had started lisinopril a couple months prior to the  onset of the hives, however has not had any episodes of isolated angioedema.  He does not recall ever having had urticaria or angioedema in the past.  His mother has a shellfish allergy which she acquired in adulthood. He experiences nasal congestion, rhinorrhea, sneezing, nasal pruritus, and ocular pruritus.  These symptoms typically occur in the springtime and summer.  He attempts to control these symptoms with cetirizine.  Assessment and plan: Urticaria Unclear etiology. Skin tests to select food allergens were negative today. NSAIDs and emotional stress commonly exacerbate urticaria but are not the underlying etiology in this case. Often times the onset of urticaria is secondary to viral infection, even if clinical manifestations of an infection are not clearly evident. Once the mast cell membranes have been destabilized, it is not unusual for recurrent episodes of histamine release to occur in the ensuing weeks or months. Physical urticarias are negative by history (i.e. pressure-induced, temperature, vibration, solar, etc.). History and lesions are not consistent with urticaria pigmentosa so I am not suspicious for mastocytosis. There are no concomitant symptoms concerning for anaphylaxis or constitutional symptoms worrisome for an underlying malignancy. We will rule out other potential etiologies with labs. For symptom relief, patient is to take oral antihistamines as directed.  The following labs have been ordered: FCeRI antibody, anti-thyroglobulin antibody, thyroid peroxidase antibody, tryptase, CBC, CMP, ANA, ESR, and serum specific IgE against fish panel, tree nut panel with reflex components, and alpha gal panel.  The patient will be called with further recommendations after lab results have returned.  Should symptoms  recur, instructions have been discussed and provided for H1/H2 receptor blockade with titration to find lowest effective dose.  A prescription has been provided for  levocetirizine, 5 mg daily as needed.  A prescription has been provided for montelukast 10 mg daily at bedtime.  Should there be a significant increase or change in symptoms, a journal is to be kept recording any foods eaten, beverages consumed, medications taken within a 6 hour period prior to the onset of symptoms, as well as record activities being performed, and environmental conditions. For any symptoms concerning for anaphylaxis, epinephrine is to be administered and 911 is to be called immediately.  A prescription has been provided for epinephrine 0.3 mg autoinjector 2 pack along with instructions for its proper administration.  Angioedema Associated angioedema occurs in up to 50% of patients with recurrent urticaria.  Treatment/diagnostic plan as outlined above.  Seasonal allergic rhinitis  Aeroallergen avoidance measures have been discussed and provided in written form.  Levocetirizine and montelukast have been prescribed (as above).  A prescription has been provided for fluticasone nasal spray, one spray per nostril 1-2 times daily as needed. Proper nasal spray technique has been discussed and demonstrated.  Nasal saline spray (i.e., Simply Saline) or nasal saline lavage (i.e., NeilMed) is recommended as needed and prior to medicated nasal sprays.   Meds ordered this encounter  Medications  . levocetirizine (XYZAL) 5 MG tablet    Sig: Take 1 tablet (5 mg total) by mouth every evening.    Dispense:  30 tablet    Refill:  5  . montelukast (SINGULAIR) 10 MG tablet    Sig: Take 1 tablet (10 mg total) by mouth at bedtime.    Dispense:  30 tablet    Refill:  5  . EPINEPHrine (AUVI-Q) 0.3 mg/0.3 mL IJ SOAJ injection    Sig: Inject 0.3 mLs (0.3 mg total) into the muscle once for 1 dose.    Dispense:  1 Device    Refill:  2    [phone] (M)  . fluticasone (FLONASE) 50 MCG/ACT nasal spray    Sig: Place 2 sprays into both nostrils daily as needed for allergies or rhinitis.      Dispense:  1 g    Refill:  5    Diagnostics: Environmental skin testing: Robust reactivity to tree pollen and grass pollen. Food allergen skin testing: Negative despite a positive histamine control.    Physical examination: Blood pressure 124/70, pulse 76, temperature 97.8 F (36.6 C), temperature source Oral, resp. rate 18, height 6' (1.829 m), weight 214 lb (97.1 kg), SpO2 94 %.  General: Alert, interactive, in no acute distress. HEENT: TMs pearly gray, turbinates mildly edematous without discharge, post-pharynx unremarkable. Neck: Supple without lymphadenopathy. Lungs: Clear to auscultation without wheezing, rhonchi or rales. CV: Normal S1, S2 without murmurs. Abdomen: Nondistended, nontender. Skin: Warm and dry, without lesions or rashes. Extremities:  No clubbing, cyanosis or edema. Neuro:   Grossly intact.  Review of systems:  Review of systems negative except as noted in HPI / PMHx or noted below: Review of Systems  Constitutional: Negative.   HENT: Negative.   Eyes: Negative.   Respiratory: Negative.   Cardiovascular: Negative.   Gastrointestinal: Negative.   Genitourinary: Negative.   Musculoskeletal: Negative.   Skin: Negative.   Neurological: Negative.   Endo/Heme/Allergies: Negative.   Psychiatric/Behavioral: Negative.     Past medical history:  Past Medical History:  Diagnosis Date  . Acquired renal cyst of left kidney 02/4095   Complicated  cyst vs solid mass: contrast enhanced CT was recommended but ?apparently not done?  Marland Kitchen Atypical chest pain 05/2016   w/u in ED neg.  Saw Dr. Orene Desanctis for ED f/u and ETT planned but MD like the pain was likely biliary colic.  Pt never got the ETT.  Marland Kitchen Cholelithiasis    Noted on  CT abd.  No cholecystitis.  Reconfirmed on RUQ u/s 05/21/16.  Marland Kitchen Chronic renal insufficiency, stage II (mild)    Borderline stage II/III (CrCl estimate @ low 60s)  . History of kidney stones   . Hyperlipidemia   . Hypertension   .  Hypogonadism male 02/2010  . Left ureteral stone   . Nephrolithiasis    left non-obstucive per ct 05-25-2015--79m x 6 mm stone just beyond ureteropelvic jxn in prox L ureter, severe left hydronephrosis noted with kidney edema.  Pt then saw Dr. MAlyson Ingleswith urol and got ureteroscopy with stent placement.  . Polycythemia 05/10/2010   EPO level normal-- IDIOPATHIC  . Rectal bleeding 04/2016   Saw GI--dx of outlet bleeding, reassured, constipation tx advised.  . Urgency of urination     Past surgical history:  Past Surgical History:  Procedure Laterality Date  . COLONOSCOPY  09/30/11   Normal-repeat in 10 yrs  . CYSTOSCOPY WITH STENT PLACEMENT Left 05/29/2015   Procedure: CYSTOSCOPY WITH STENT PLACEMENT, RETROGRADE;  Surgeon: PCleon Gustin MD;  Location: [facility];  Service: Urology;  Laterality: Left;  . CYSTOSCOPY/RETROGRADE/URETEROSCOPY/STONE EXTRACTION WITH BASKET Left 06/15/2015   Procedure: CYSTOSCOPY/RETROGRADE/URETEROSCOPY/STONE EXTRACTION WITH BASKET WITH STENT EXCHANGE;  Surgeon: PCleon Gustin MD;  [facility]  Service: Urology;  Laterality: Left;  . HOLMIUM LASER APPLICATION Left 62/05/4268  Procedure: HOLMIUM LASER APPLICATION;  Surgeon: PCleon Gustin MD;  [facility]  Service: Urology;  Laterality: Left;  . KNEE SURGERY Right 2002    Family history: Family History  Problem Relation Age of Onset  . Hypertension Mother   . Food Allergy Mother   . Cancer Father        lung/ smoker  . Kidney disease Brother   . HIV Brother     Social history: Social History   Socioeconomic History  . Marital status: Married    Spouse name: Not on file  . Number of children: Not on file  . Years of education: Not on file  . Highest education level: Not on file  Occupational History  . Not on file  Social Needs  . Financial resource strain: Not on file  . Food insecurity:    Worry: Not on file    Inability: Not on file    . Transportation needs:    Medical: Not on file    Non-medical: Not on file  Tobacco Use  . Smoking status: Never Smoker  . Smokeless tobacco: Never Used  Substance and Sexual Activity  . Alcohol use: Yes    Alcohol/week: 3.0 - 4.0 standard drinks    Types: 3 - 4 Glasses of wine per week    Comment: 2 glasses of wine 2 days a week  . Drug use: No  . Sexual activity: Yes    Partners: Female  Lifestyle  . Physical activity:    Days per week: Not on file    Minutes per session: Not on file  . Stress: Not on file  Relationships  . Social connections:    Talks on phone: Not on file    Gets together: Not on  file    Attends religious service: Not on file    Active member of club or organization: Not on file    Attends meetings of clubs or organizations: Not on file    Relationship status: Not on file  . Intimate partner violence:    Fear of current or ex partner: Not on file    Emotionally abused: Not on file    Physically abused: Not on file    Forced sexual activity: Not on file  Other Topics Concern  . Not on file  Social History Narrative   Married, 3 children (63, 85, 21 yrs).  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   From [place].  Was in [employer] x 10 yrs in [place], [place].   Works out and plays sports regularly.  Rare wine intake.  No cigs or drugs.   Environmental History: The patient lives in a [address] year old townhouse with carpeting in the bedroom, gas heat, and central air.  There is a dog in the home which has access to his bedroom.  There is no known mold/water damage in the home.  He is a non-smoker.  Allergies as of 12/02/2017   No Known Allergies     Medication List        Accurate as of 12/02/17 12:28 PM. Always use your most recent med list.          amLODipine 10 MG tablet Commonly known as:  NORVASC Take 1 tablet (10 mg total) by mouth daily.   atorvastatin 40 MG tablet Commonly known as:  LIPITOR Take 40 mg by mouth daily. Take 1 tablet by mouth  daily.   EPINEPHrine 0.3 mg/0.3 mL Soaj injection Commonly known as:  EPI-PEN Inject 0.3 mLs (0.3 mg total) into the muscle once for 1 dose.   fluticasone 50 MCG/ACT nasal spray Commonly known as:  FLONASE Place 2 sprays into both nostrils daily as needed for allergies or rhinitis.   levocetirizine 5 MG tablet Commonly known as:  XYZAL Take 1 tablet (5 mg total) by mouth every evening.   lisinopril 10 MG tablet Commonly known as:  PRINIVIL,ZESTRIL Take 1 tablet (10 mg total) by mouth daily.   montelukast 10 MG tablet Commonly known as:  SINGULAIR Take 1 tablet (10 mg total) by mouth at bedtime.   traZODone 50 MG tablet Commonly known as:  DESYREL Take 50 mg by mouth at bedtime as needed.       Known medication allergies: No Known Allergies  I appreciate the opportunity to take part in Theo's care. Please do not hesitate to contact me with questions.  Sincerely,   R. Edgar Frisk, MD

## 2017-12-02 NOTE — Patient Instructions (Addendum)
Urticaria Unclear etiology. Skin tests to select food allergens were negative today. NSAIDs and emotional stress commonly exacerbate urticaria but are not the underlying etiology in this case. Often times the onset of urticaria is secondary to viral infection, even if clinical manifestations of an infection are not clearly evident. Once the mast cell membranes have been destabilized, it is not unusual for recurrent episodes of histamine release to occur in the ensuing weeks or months. Physical urticarias are negative by history (i.e. pressure-induced, temperature, vibration, solar, etc.). History and lesions are not consistent with urticaria pigmentosa so I am not suspicious for mastocytosis. There are no concomitant symptoms concerning for anaphylaxis or constitutional symptoms worrisome for an underlying malignancy. We will rule out other potential etiologies with labs. For symptom relief, patient is to take oral antihistamines as directed.  The following labs have been ordered: FCeRI antibody, anti-thyroglobulin antibody, thyroid peroxidase antibody, tryptase, CBC, CMP, ANA, ESR, and serum specific IgE against fish panel, tree nut panel with reflex components, and alpha gal panel.  The patient will be called with further recommendations after lab results have returned.  Should symptoms recur, instructions have been discussed and provided for H1/H2 receptor blockade with titration to find lowest effective dose.  A prescription has been provided for levocetirizine, 5 mg daily as needed.  A prescription has been provided for montelukast 10 mg daily at bedtime.  Should there be a significant increase or change in symptoms, a journal is to be kept recording any foods eaten, beverages consumed, medications taken within a 6 hour period prior to the onset of symptoms, as well as record activities being performed, and environmental conditions. For any symptoms concerning for anaphylaxis, epinephrine is to be  administered and 911 is to be called immediately.  A prescription has been provided for epinephrine 0.3 mg autoinjector 2 pack along with instructions for its proper administration.  Angioedema Associated angioedema occurs in up to 50% of patients with recurrent urticaria.  Treatment/diagnostic plan as outlined above.  Seasonal allergic rhinitis  Aeroallergen avoidance measures have been discussed and provided in written form.  Levocetirizine and montelukast have been prescribed (as above).  A prescription has been provided for fluticasone nasal spray, one spray per nostril 1-2 times daily as needed. Proper nasal spray technique has been discussed and demonstrated.  Nasal saline spray (i.e., Simply Saline) or nasal saline lavage (i.e., NeilMed) is recommended as needed and prior to medicated nasal sprays.  When lab results have returned the patient will be called with further recommendations and follow up instructions.  Urticaria (Hives)  . Levocetirizine (Xyzal) 5 mg twice a day and famotidine (Pepcid) 20 mg twice a day. If no symptoms for 7-14 days then decrease to. . Levocetirizine (Xyzal) 5 mg twice a day and famotidine (Pepcid) 20 mg once a day.  If no symptoms for 7-14 days then decrease to. . Levocetirizine (Xyzal) 5 mg twice a day.  If no symptoms for 7-14 days then decrease to. . Levocetirizine (Xyzal) 5 mg once a day.  May use Benadryl (diphenhydramine) as needed for breakthrough symptoms       If symptoms return, then step up dosage  Reducing Pollen Exposure  The American Academy of Allergy, Asthma and Immunology suggests the following steps to reduce your exposure to pollen during allergy seasons.    1. Do not hang sheets or clothing out to dry; pollen may collect on these items. 2. Do not mow lawns or spend time around freshly cut grass; mowing stirs up  pollen. 3. Keep windows closed at night.  Keep car windows closed while driving. 4. Minimize morning activities  outdoors, a time when pollen counts are usually at their highest. 5. Stay indoors as much as possible when pollen counts or humidity is high and on windy days when pollen tends to remain in the air longer. 6. Use air conditioning when possible.  Many air conditioners have filters that trap the pollen spores. 7. Use a HEPA room air filter to remove pollen form the indoor air you breathe.

## 2017-12-02 NOTE — Assessment & Plan Note (Signed)
   Aeroallergen avoidance measures have been discussed and provided in written form.  Levocetirizine and montelukast have been prescribed (as above).  A prescription has been provided for fluticasone nasal spray, one spray per nostril 1-2 times daily as needed. Proper nasal spray technique has been discussed and demonstrated.  Nasal saline spray (i.e., Simply Saline) or nasal saline lavage (i.e., NeilMed) is recommended as needed and prior to medicated nasal sprays.

## 2017-12-02 NOTE — Assessment & Plan Note (Signed)
Associated angioedema occurs in up to 50% of patients with recurrent urticaria.  Treatment/diagnostic plan as outlined above.

## 2017-12-02 NOTE — Assessment & Plan Note (Addendum)
Unclear etiology. Skin tests to select food allergens were negative today. NSAIDs and emotional stress commonly exacerbate urticaria but are not the underlying etiology in this case. Often times the onset of urticaria is secondary to viral infection, even if clinical manifestations of an infection are not clearly evident. Once the mast cell membranes have been destabilized, it is not unusual for recurrent episodes of histamine release to occur in the ensuing weeks or months. Physical urticarias are negative by history (i.e. pressure-induced, temperature, vibration, solar, etc.). History and lesions are not consistent with urticaria pigmentosa so I am not suspicious for mastocytosis. There are no concomitant symptoms concerning for anaphylaxis or constitutional symptoms worrisome for an underlying malignancy. We will rule out other potential etiologies with labs. For symptom relief, patient is to take oral antihistamines as directed.  The following labs have been ordered: FCeRI antibody, anti-thyroglobulin antibody, thyroid peroxidase antibody, tryptase, CBC, CMP, ANA, ESR, and serum specific IgE against fish panel, tree nut panel with reflex components, and alpha gal panel.  The patient will be called with further recommendations after lab results have returned.  Should symptoms recur, instructions have been discussed and provided for H1/H2 receptor blockade with titration to find lowest effective dose.  A prescription has been provided for levocetirizine, 5 mg daily as needed.  A prescription has been provided for montelukast 10 mg daily at bedtime.  Should there be a significant increase or change in symptoms, a journal is to be kept recording any foods eaten, beverages consumed, medications taken within a 6 hour period prior to the onset of symptoms, as well as record activities being performed, and environmental conditions. For any symptoms concerning for anaphylaxis, epinephrine is to be  administered and 911 is to be called immediately.  A prescription has been provided for epinephrine 0.3 mg autoinjector 2 pack along with instructions for its proper administration.

## 2017-12-16 ENCOUNTER — Telehealth: Payer: Self-pay | Admitting: *Deleted

## 2017-12-16 ENCOUNTER — Telehealth: Payer: Self-pay

## 2017-12-16 DIAGNOSIS — I10 Essential (primary) hypertension: Secondary | ICD-10-CM

## 2017-12-16 MED ORDER — LISINOPRIL 10 MG PO TABS: 10.0000 mg | ORAL_TABLET | Freq: Every day | ORAL | 0 refills | Status: DC

## 2017-12-16 NOTE — Telephone Encounter (Signed)
Left message for pt to call back. Pt is over due for f/u on HTN, needs office visit. Will refill lisinopril for #30 w/ 0RF. Needs office visit for more refills.

## 2017-12-16 NOTE — Telephone Encounter (Signed)
Received fax that [employer] is trying to reach patient to set up delivery. Called and left message for patient to call office in regards to this matter. Will need to inform patient and have him contact [phone].

## 2017-12-18 NOTE — Telephone Encounter (Signed)
Attempted to contact patient.  Unable to leave message due to vm being full.

## 2017-12-19 NOTE — Telephone Encounter (Signed)
Tried calling pt, NA and unable to leave a message due to vm box being full or not set up yet.

## 2017-12-22 NOTE — Telephone Encounter (Signed)
Attempted to contact patient.  Unable to leave message due to vm being full.

## 2017-12-23 NOTE — Telephone Encounter (Signed)
Called patient again. No answer and voicemail is still full. I am sending a letter requesting a call back.

## 2017-12-23 NOTE — Telephone Encounter (Signed)
Tried calling pt, NA and unable to leave a message due to vm box being full or not set up yet.

## 2017-12-30 ENCOUNTER — Encounter: Payer: Self-pay | Admitting: *Deleted

## 2017-12-30 NOTE — Telephone Encounter (Signed)
Letter mailed to address in EMR.

## 2018-03-24 DIAGNOSIS — J101 Influenza due to other identified influenza virus with other respiratory manifestations: Secondary | ICD-10-CM | POA: Diagnosis not present

## 2018-08-01 LAB — BASIC METABOLIC PANEL
BUN: 22 — AB (ref 4–21)
Creatinine: 1.4 — AB (ref 0.6–1.3)
Glucose: 82
Potassium: 4.3 (ref 3.4–5.3)
Sodium: 140 (ref 137–147)

## 2018-08-01 LAB — CBC AND DIFFERENTIAL
HCT: 48 (ref 41–53)
Hemoglobin: 15.6 (ref 13.5–17.5)
Platelets: 201 (ref 150–399)
WBC: 6.2

## 2018-08-01 LAB — HEPATIC FUNCTION PANEL
ALT: 19 (ref 10–40)
AST: 20 (ref 14–40)
Alkaline Phosphatase: 41 (ref 25–125)
Bilirubin, Total: 0.4

## 2018-08-03 ENCOUNTER — Ambulatory Visit: Payer: Self-pay | Admitting: Family Medicine

## 2018-08-03 NOTE — Telephone Encounter (Signed)
Noted  

## 2018-08-03 NOTE — Telephone Encounter (Signed)
Pt called in c/o left lower abd pain which is swollen also.   See triage notes.  I warm transferred the call into Dr. Idelle Leech office to [place] for scheduling.  I sent my notes to the office.  Reason for Disposition . [1] MILD-MODERATE pain AND [2] constant AND [3] present > 2 hours  Answer Assessment - Initial Assessment Questions 1. LOCATION: "Where does it hurt?"      Thursday last wk having sharp pain in left lower abd.   I went an Urgent Care in [facility] on Sat.   X-ray done.   I have a history of kidney stones.   This feels different 2. RADIATION: "Does the pain shoot anywhere else?" (e.g., chest, back)     Yesterday noticed swelling in left lower abd.    X-ray did not show anything.   Dr. At urgent care thought maybe muscle pulled. 3. ONSET: "When did the pain begin?" (Minutes, hours or days ago)      Thursday last wk.     Dr. Marcille Buffy blood in my urine.   Gave me muscle relaxer and pain medicine.  It's not painful to lay on.   Painful when I touch it.   It almost feels like a muscle.      4. SUDDEN: "Gradual or sudden onset?"     8 on pain scale if I touch it.    I rode my bike 3 1/2 miles on Sat and it did not hurt.    No abd surgery.   Had left kidney surgery in the past. 5. PATTERN "Does the pain come and go, or is it constant?"    - If constant: "Is it getting better, staying the same, or worsening?"      (Note: Constant means the pain never goes away completely; most serious pain is constant and it progresses)     - If intermittent: "How long does it last?" "Do you have pain now?"     (Note: Intermittent means the pain goes away completely between bouts)     Just when I touch it. 6. SEVERITY: "How bad is the pain?"  (e.g., Scale 1-10; mild, moderate, or severe)    - MILD (1-3): doesn't interfere with normal activities, abdomen soft and not tender to touch     - MODERATE (4-7): interferes with normal activities or awakens from sleep, tender to touch     - SEVERE (8-10):  excruciating pain, doubled over, unable to do any normal activities       8 on scale. 7. RECURRENT SYMPTOM: "Have you ever had this type of abdominal pain before?" If so, ask: "When was the last time?" and "What happened that time?"      No 8. CAUSE: "What do you think is causing the abdominal pain?"     I don't know 9. RELIEVING/AGGRAVATING FACTORS: "What makes it better or worse?" (e.g., movement, antacids, bowel movement)     touching 10. OTHER SYMPTOMS: "Has there been any vomiting, diarrhea, constipation, or urine problems?"       No.  Protocols used: ABDOMINAL PAIN - MALE-A-AH

## 2018-08-04 ENCOUNTER — Encounter: Payer: Self-pay | Admitting: Family Medicine

## 2018-08-04 ENCOUNTER — Other Ambulatory Visit: Payer: Self-pay

## 2018-08-04 ENCOUNTER — Ambulatory Visit: Payer: Commercial Managed Care - PPO | Admitting: Family Medicine

## 2018-08-04 VITALS — BP 118/70 | HR 77 | Temp 97.5°F | Resp 16 | Ht 72.0 in | Wt 213.8 lb

## 2018-08-04 DIAGNOSIS — R10812 Left upper quadrant abdominal tenderness: Secondary | ICD-10-CM | POA: Diagnosis not present

## 2018-08-04 DIAGNOSIS — R1032 Left lower quadrant pain: Secondary | ICD-10-CM | POA: Diagnosis not present

## 2018-08-04 NOTE — Progress Notes (Addendum)
OFFICE VISIT  08/04/2018   CC:  Chief Complaint  Patient presents with  . Abdominal Pain    lower left, tender to touch and swollen   HPI:    Patient is a 58 y.o. African-American male who presents for lower abdominal pain. Onset approx: 5 d/a.   Pain in LLQ, sharp in nature.  Intermittent.  Primarily painful to the touch.  Occ feels a bit of discomfort in the area from the vibrations of walking.   Pain got worse a few days after it started, so he went to [facility] 3 d/a: x-ray ? "small stones", pt says provider not sure and was waiting for radiology over-read.  Urine apparently also showed some blood. No records available for review at this time.  He was rx'd tramadol and tizanidine. Wife believes he pulled something or hit something recently b/c they've been doing some home remodeling lately.  However, he recalls no trauma or strain at all.   Says the area has now started to "swell up", protrudes a little bit.  He notes it the most when he is standing and looks down at abdomen-->left lower side protrudes a bit compared to right. No rash.   No NSAIDs have been tried.  No hx of any abdominal surgery. Has had ureterolithiasis that required left ureteroscopy with stent placement back in 2017. (As of the urol note in our EMR dated 06/23/15, it was recommended that he "return in 6 wks for repeat renal ultrasound" to f/u the hydronephrosis he had prior to getting stone extraction from ureter.)  Pt states he does not think he ever returned for the ultrasound.  Hx of gallstones w/out cholecystitis.  ROS: no fevers, no abnl wt loss, no CP, no SOB, no wheezing, no cough, no dizziness, no HAs, no rashes, no melena/hematochezia.  No polyuria or polydipsia.  No myalgias or arthralgias.  No diarrhea or constipation. No flank pain, no gross hematuria, no dysuria.   No skin bruising. No nosebleeds. Energy level is good.  Past Medical History:  Diagnosis Date  . Acquired renal cyst of left kidney  40/1027   Complicated cyst vs solid mass: contrast enhanced CT was recommended but ?apparently not done?  Marland Kitchen Atypical chest pain 05/2016   w/u in ED neg.  Saw Dr. Orene Desanctis for ED f/u and ETT planned but MD like the pain was likely biliary colic.  Pt never got the ETT.  Marland Kitchen Cholelithiasis    Noted on  CT abd.  No cholecystitis.  Reconfirmed on RUQ u/s 05/21/16.  Marland Kitchen Chronic renal insufficiency, stage II (mild)    Borderline stage II/III (CrCl estimate @ low 60s)  . History of kidney stones   . Hyperlipidemia   . Hypertension   . Hypogonadism male 02/2010  . Left ureteral stone   . Nephrolithiasis    left non-obstucive per ct 05-25-2015--14mm x 6 mm stone just beyond ureteropelvic jxn in prox L ureter, severe left hydronephrosis noted with kidney edema.  Pt then saw Dr. Alyson Ingles with urol and got ureteroscopy with stent placement.  . Polycythemia 05/10/2010   EPO level normal-- IDIOPATHIC  . Rectal bleeding 04/2016   Saw GI--dx of outlet bleeding, reassured, constipation tx advised.  . Urgency of urination   . Urticaria    Allergy w/u by Dr. Verlin Fester    Past Surgical History:  Procedure Laterality Date  . COLONOSCOPY  09/30/11   Normal-repeat in 10 yrs  . CYSTOSCOPY WITH STENT PLACEMENT Left 05/29/2015   Procedure: CYSTOSCOPY  WITH STENT PLACEMENT, RETROGRADE;  Surgeon: Cleon Gustin, MD;  Location: [facility];  Service: Urology;  Laterality: Left;  . CYSTOSCOPY/RETROGRADE/URETEROSCOPY/STONE EXTRACTION WITH BASKET Left 06/15/2015   Procedure: CYSTOSCOPY/RETROGRADE/URETEROSCOPY/STONE EXTRACTION WITH BASKET WITH STENT EXCHANGE;  Surgeon: Cleon Gustin, MD;  Location: [facility];  Service: Urology;  Laterality: Left;  . HOLMIUM LASER APPLICATION Left 09/20/2950   Procedure: HOLMIUM LASER APPLICATION;  Surgeon: Cleon Gustin, MD;  Location: [facility];  Service: Urology;  Laterality: Left;  . KNEE SURGERY Right 2002    Outpatient Medications Prior to Visit   Medication Sig Dispense Refill  . amLODipine (NORVASC) 10 MG tablet Take 1 tablet (10 mg total) by mouth daily. 90 tablet 2  . atorvastatin (LIPITOR) 40 MG tablet Take 40 mg by mouth daily. Take 1 tablet by mouth daily.    . fluticasone (FLONASE) 50 MCG/ACT nasal spray Place 2 sprays into both nostrils daily as needed for allergies or rhinitis. 1 g 5  . levocetirizine (XYZAL) 5 MG tablet Take 1 tablet (5 mg total) by mouth every evening. 30 tablet 5  . montelukast (SINGULAIR) 10 MG tablet Take 1 tablet (10 mg total) by mouth at bedtime. 30 tablet 5  . traZODone (DESYREL) 50 MG tablet Take 50 mg by mouth at bedtime as needed.  4  . tiZANidine (ZANAFLEX) 4 MG capsule     . lisinopril (PRINIVIL,ZESTRIL) 10 MG tablet Take 1 tablet (10 mg total) by mouth daily. OFFICE VISIT NEEDED FOR MORE REFILLS. (Patient not taking: Reported on 08/04/2018) 30 tablet 0   No facility-administered medications prior to visit.     No Known Allergies  ROS As per HPI  PE: Blood pressure 118/70, pulse 77, temperature (!) 97.5 F (36.4 C), temperature source Temporal, resp. rate 16, height 6' (1.829 m), weight 213 lb 12.8 oz (97 kg), SpO2 98 %. VITALS:  Gen: alert, well appearing. HEENT: eyes without swelling, eryth, or drainage. Nose: clear.  Throat: no swelling, erythema, or exudate. Neck: no adenopathy, thyromegaly, or tenderness. Lungs: CTA bilat, nonlabored resps.  CV: RRR, no m/r/g Abd: soft, with slight protrusion of abd wall in left lateral rectus muscle region in left lower abdomen-->diffuse (only noted with pt standing up.  It does not appear asymmetric when pt is lying supine).  No focal swelling to suggest subQ nodule or hematoma.  The area of swelling is soft and nontender.  He has a bit of tenderness to palpation in LUQ region w/out rebound or guarding.  I cannot feel any mass or organomegaly.  Bowel sounds are normal.  No bruit. When patient does abd wall crunch while I press on the tender area the  tenderness does not change. EXT: no edema SKIN: no rash or focal lesion  LABS:  Lab Results  Component Value Date   TSH 1.43 09/01/2017   Lab Results  Component Value Date   WBC 5.2 09/01/2017   HGB 16.8 09/01/2017   HCT 51 09/01/2017   MCV 89.3 05/21/2016   PLT 157 09/01/2017   Lab Results  Component Value Date   CREATININE 1.4 (A) 09/01/2017   BUN 21 09/01/2017   NA 145 09/01/2017   K 4.1 09/01/2017   CL 106 05/21/2016   CO2 24 05/21/2016   Lab Results  Component Value Date   ALT 27 09/01/2017   AST 18 09/01/2017   ALKPHOS 49 09/01/2017   BILITOT 0.2 (L) 04/05/2012   Lab Results  Component Value Date  CHOL 227 (A) 09/01/2017   Lab Results  Component Value Date   HDL 50 09/01/2017   Lab Results  Component Value Date   LDLCALC 157 09/01/2017   Lab Results  Component Value Date   TRIG 100 09/01/2017   Lab Results  Component Value Date   CHOLHDL 4 07/22/2011   Lab Results  Component Value Date   PSA 2.2 09/01/2017   PSA 0.75 07/22/2011   Lab Results  Component Value Date   HGBA1C 5.5 09/01/2017    IMPRESSION AND PLAN:  1) Left lower abdominal wall pain with slight protrusion.  Unknown etiology. No red flags for intra-abdominal problem.  His tenderness is in the area where his spleen tip is and if he had splenomegaly it could be an explanation of his sx's, but I cannot feel the spleen or any other intra-abdominal organ or mass.   Best dx at this time is unexplained abd wall injury. Discussed approach today and decided to keep with observation for now. I'll review records from the urgent care visit he had a few days ago. If he is worsening in the next week or if not continuing to improve in 1 wk then he'll call and the next step is to get CT abd/pelvis.  An After Visit Summary was printed and given to the patient.  FOLLOW UP: Return if symptoms worsen or fail to improve.  Signed:  Crissie Sickles, MD           08/04/2018  ADDENDUM  10/05/18: Reviewed UC records from 08/01/18--> +Blood on UA, Normal cbc w/diff and chemistries except sCr 1.5 (his baseline is about 1.5) Acute abd series showed 1 cm calcification overlying R kidney. Signed:  Crissie Sickles, MD           10/05/2018

## 2018-08-05 ENCOUNTER — Encounter: Payer: Self-pay | Admitting: Family Medicine

## 2018-09-01 ENCOUNTER — Encounter: Payer: Self-pay | Admitting: Family Medicine

## 2018-09-15 DIAGNOSIS — U071 COVID-19: Secondary | ICD-10-CM

## 2018-09-15 HISTORY — DX: COVID-19: U07.1

## 2018-10-15 DIAGNOSIS — U071 COVID-19: Secondary | ICD-10-CM

## 2018-10-15 HISTORY — DX: COVID-19: U07.1

## 2018-10-16 ENCOUNTER — Other Ambulatory Visit: Payer: Self-pay

## 2018-10-16 DIAGNOSIS — Z20822 Contact with and (suspected) exposure to covid-19: Secondary | ICD-10-CM

## 2018-10-17 LAB — NOVEL CORONAVIRUS, NAA: SARS-CoV-2, NAA: DETECTED — AB

## 2018-10-18 ENCOUNTER — Encounter: Payer: Self-pay | Admitting: Family Medicine

## 2018-10-20 ENCOUNTER — Encounter: Payer: Self-pay | Admitting: Family Medicine

## 2018-10-26 ENCOUNTER — Other Ambulatory Visit: Payer: Self-pay

## 2018-10-26 DIAGNOSIS — Z20822 Contact with and (suspected) exposure to covid-19: Secondary | ICD-10-CM

## 2018-10-27 LAB — NOVEL CORONAVIRUS, NAA: SARS-CoV-2, NAA: DETECTED — AB

## 2018-10-28 ENCOUNTER — Encounter: Payer: Self-pay | Admitting: Family Medicine

## 2018-10-30 ENCOUNTER — Other Ambulatory Visit: Payer: Self-pay

## 2018-10-30 DIAGNOSIS — Z20822 Contact with and (suspected) exposure to covid-19: Secondary | ICD-10-CM

## 2018-11-01 LAB — NOVEL CORONAVIRUS, NAA: SARS-CoV-2, NAA: NOT DETECTED

## 2018-11-05 ENCOUNTER — Ambulatory Visit: Payer: Commercial Managed Care - PPO | Admitting: Family Medicine

## 2018-11-05 ENCOUNTER — Other Ambulatory Visit: Payer: Self-pay

## 2018-11-05 ENCOUNTER — Encounter: Payer: Self-pay | Admitting: Family Medicine

## 2018-11-05 VITALS — BP 113/77 | HR 68 | Temp 98.2°F | Resp 16 | Ht 72.0 in | Wt 213.0 lb

## 2018-11-05 DIAGNOSIS — Z8616 Personal history of COVID-19: Secondary | ICD-10-CM

## 2018-11-05 DIAGNOSIS — I10 Essential (primary) hypertension: Secondary | ICD-10-CM

## 2018-11-05 DIAGNOSIS — Z8619 Personal history of other infectious and parasitic diseases: Secondary | ICD-10-CM

## 2018-11-05 NOTE — Progress Notes (Signed)
OFFICE VISIT  11/05/2018   CC:  Chief Complaint  Patient presents with  . Follow-up    COVID-19 illness    HPI:    Patient is a 58 y.o. African-American male with HTN, HLD, and allergic rhinitis who presents for f/u recent covid 19 illness.  Onset approx Oct 1->nasal congestion with PND, no fever or SOB.  No cough.  Very fatigued and had HA.  No ST. Mild achiness.general. He had complete resolution of sx's after about 2 wks of sx's. Back to baseline now. Starting back with exercise now.  Covid 19 pos 10/16/18. Covid 19 pos 10/27/18. Covid 19 recheck NEG on 11/01/18.  Past Medical History:  Diagnosis Date  . Acquired renal cyst of left kidney [id number]   Complicated cyst vs solid mass: contrast enhanced CT was recommended but ?apparently not done?  Marland Kitchen Atypical chest pain 05/2016   w/u in ED neg.  Saw Dr. Orene Desanctis for ED f/u and ETT planned but MD like the pain was likely biliary colic.  Pt never got the ETT.  Marland Kitchen Cholelithiasis    Noted on  CT abd.  No cholecystitis.  Reconfirmed on RUQ u/s 05/21/16.  Marland Kitchen Chronic renal insufficiency, stage II (mild)    Borderline stage II/III (CrCl estimate @ low 60s)  . [id number] virus detected 10/2018   + covid test on 10/2 and again on 10/12, 2020.  Marland Kitchen History of kidney stones   . Hyperlipidemia   . Hypertension   . Hypogonadism male 02/2010  . Left ureteral stone 2017   Saw urol for f/u 06/23/15 and plan was for him to return to them in 6 wks to get repeat u/s but he did not do this.  . Nephrolithiasis    left non-obstucive per ct 05-25-2015--85mm x 6 mm stone just beyond ureteropelvic jxn in prox L ureter, severe left hydronephrosis noted with kidney edema.  Pt then saw Dr. Alyson Ingles with urol and got ureteroscopy with stent placement.  . Polycythemia 05/10/2010   EPO level normal-- IDIOPATHIC  . Rectal bleeding 04/2016   Saw GI--dx of outlet bleeding, reassured, constipation tx advised.  . Urgency of urination   . Urticaria    Allergy w/u by Dr.  Verlin Fester    Past Surgical History:  Procedure Laterality Date  . COLONOSCOPY  09/30/11   Normal-repeat in 10 yrs  . CYSTOSCOPY WITH STENT PLACEMENT Left 05/29/2015   Procedure: CYSTOSCOPY WITH STENT PLACEMENT, RETROGRADE;  Surgeon: Cleon Gustin, MD;  Location: [facility];  Service: Urology;  Laterality: Left;  . CYSTOSCOPY/RETROGRADE/URETEROSCOPY/STONE EXTRACTION WITH BASKET Left 06/15/2015   Procedure: CYSTOSCOPY/RETROGRADE/URETEROSCOPY/STONE EXTRACTION WITH BASKET WITH STENT EXCHANGE;  Surgeon: Cleon Gustin, MD;  Location: [facility];  Service: Urology;  Laterality: Left;  . HOLMIUM LASER APPLICATION Left [id number]   Procedure: HOLMIUM LASER APPLICATION;  Surgeon: Cleon Gustin, MD;  Location: [facility];  Service: Urology;  Laterality: Left;  . KNEE SURGERY Right 2002    Outpatient Medications Prior to Visit  Medication Sig Dispense Refill  . amLODipine (NORVASC) 10 MG tablet Take 1 tablet (10 mg total) by mouth daily. 90 tablet 2  . atorvastatin (LIPITOR) 40 MG tablet Take 40 mg by mouth daily. Take 1 tablet by mouth daily.    . fluticasone (FLONASE) 50 MCG/ACT nasal spray Place 2 sprays into both nostrils daily as needed for allergies or rhinitis. 1 g 5  . levocetirizine (XYZAL) 5 MG tablet Take 1 tablet (5 mg total) by mouth every  evening. 30 tablet 5  . montelukast (SINGULAIR) 10 MG tablet Take 1 tablet (10 mg total) by mouth at bedtime. 30 tablet 5  . tiZANidine (ZANAFLEX) 4 MG capsule     . traZODone (DESYREL) 50 MG tablet Take 50 mg by mouth at bedtime as needed.  4   No facility-administered medications prior to visit.     No Known Allergies  ROS As per HPI  PE: Blood pressure 113/77, pulse 68, temperature 98.2 F (36.8 C), temperature source Temporal, resp. rate 16, height 6' (1.829 m), weight 213 lb (96.6 kg), SpO2 95 %. Body mass index is 28.89 kg/m.  Gen: Alert, well appearing.  Patient is oriented to person, place, time,  and situation. AFFECT: pleasant, lucid thought and speech. ENT: Ears: EACs clear, normal epithelium.  TMs with good light reflex and landmarks bilaterally.  Eyes: no injection, icteris, swelling, or exudate.  EOMI, PERRLA. Nose: no drainage or turbinate edema/swelling.  No injection or focal lesion.  Mouth: lips without lesion/swelling.  Oral mucosa pink and moist.  Dentition intact and without obvious caries or gingival swelling.  Oropharynx without erythema, exudate, or swelling.  Neck - No masses or thyromegaly or limitation in range of motion CV: RRR, no m/r/g.   LUNGS: CTA bilat, nonlabored resps, good aeration in all lung fields. EXT: no clubbing or cyanosis.  no edema.    LABS:    Chemistry      Component Value Date/Time   NA 140 08/01/2018   K 4.3 08/01/2018   CL 106 05/21/2016 1822   CO2 24 05/21/2016 1822   BUN 22 (A) 08/01/2018   CREATININE 1.4 (A) 08/01/2018   CREATININE 1.38 (H) 05/21/2016 1822   GLU 82 08/01/2018      Component Value Date/Time   CALCIUM 9.5 05/21/2016 1822   ALKPHOS 41 08/01/2018   AST 20 08/01/2018   ALT 19 08/01/2018   BILITOT 0.2 (L) 04/05/2012 2209     Lab Results  Component Value Date   WBC 6.2 08/01/2018   HGB 15.6 08/01/2018   HCT 48 08/01/2018   MCV 89.3 05/21/2016   PLT 201 08/01/2018    IMPRESSION AND PLAN:  Covid 19 resp illness. Mild/mod sx's at most, completely resolved for at 7-8d now, covid neg 11/01/18. No sign of any residual problems. No testing/labs indicated today. His chronic problems are stable, no medication problems.  Of note, we spent some time today discussing his worries about his risk of "poor heart health", and his question of any testing that would be done in his case to determine this. I mentioned that a coronary calcium determination may be indicated in his case and if he wanted a referral to cardiologist to get their opinion then I would refer him.  He wants to think about this for now.  An After Visit  Summary was printed and given to the patient.  FOLLOW UP: Return for 4-6 mo for fasting CPE.  Signed:  Crissie Sickles, MD           11/05/2018

## 2019-01-09 ENCOUNTER — Emergency Department (HOSPITAL_BASED_OUTPATIENT_CLINIC_OR_DEPARTMENT_OTHER): Payer: Commercial Managed Care - PPO

## 2019-01-09 ENCOUNTER — Other Ambulatory Visit: Payer: Self-pay

## 2019-01-09 ENCOUNTER — Observation Stay (HOSPITAL_BASED_OUTPATIENT_CLINIC_OR_DEPARTMENT_OTHER)
Admission: EM | Admit: 2019-01-09 | Discharge: 2019-01-10 | Disposition: A | Discharge status: Home / Self Care | Payer: Commercial Managed Care - PPO | Attending: Surgery | Admitting: Surgery

## 2019-01-09 ENCOUNTER — Encounter (HOSPITAL_BASED_OUTPATIENT_CLINIC_OR_DEPARTMENT_OTHER): Payer: Self-pay | Admitting: *Deleted

## 2019-01-09 DIAGNOSIS — E785 Hyperlipidemia, unspecified: Secondary | ICD-10-CM | POA: Insufficient documentation

## 2019-01-09 DIAGNOSIS — Z20828 Contact with and (suspected) exposure to other viral communicable diseases: Secondary | ICD-10-CM | POA: Diagnosis not present

## 2019-01-09 DIAGNOSIS — I129 Hypertensive chronic kidney disease with stage 1 through stage 4 chronic kidney disease, or unspecified chronic kidney disease: Secondary | ICD-10-CM | POA: Diagnosis not present

## 2019-01-09 DIAGNOSIS — K8020 Calculus of gallbladder without cholecystitis without obstruction: Secondary | ICD-10-CM

## 2019-01-09 DIAGNOSIS — Z79899 Other long term (current) drug therapy: Secondary | ICD-10-CM | POA: Diagnosis not present

## 2019-01-09 DIAGNOSIS — N182 Chronic kidney disease, stage 2 (mild): Secondary | ICD-10-CM | POA: Insufficient documentation

## 2019-01-09 DIAGNOSIS — K8010 Calculus of gallbladder with chronic cholecystitis without obstruction: Secondary | ICD-10-CM | POA: Diagnosis not present

## 2019-01-09 DIAGNOSIS — Z7952 Long term (current) use of systemic steroids: Secondary | ICD-10-CM | POA: Diagnosis not present

## 2019-01-09 DIAGNOSIS — R1013 Epigastric pain: Secondary | ICD-10-CM | POA: Diagnosis present

## 2019-01-09 LAB — COMPREHENSIVE METABOLIC PANEL
ALT: 39 U/L (ref 0–44)
AST: 24 U/L (ref 15–41)
Albumin: 4.3 g/dL (ref 3.5–5.0)
Alkaline Phosphatase: 57 U/L (ref 38–126)
Anion gap: 10 (ref 5–15)
BUN: 17 mg/dL (ref 6–20)
CO2: 22 mmol/L (ref 22–32)
Calcium: 9.5 mg/dL (ref 8.9–10.3)
Chloride: 106 mmol/L (ref 98–111)
Creatinine, Ser: 1.28 mg/dL — ABNORMAL HIGH (ref 0.61–1.24)
GFR calc Af Amer: >60 mL/min (ref >60)
GFR calc non Af Amer: >60 mL/min (ref >60)
Glucose, Bld: 120 mg/dL — ABNORMAL HIGH (ref 70–99)
Potassium: 3.7 mmol/L (ref 3.5–5.1)
Sodium: 138 mmol/L (ref 135–145)
Total Bilirubin: 0.3 mg/dL (ref 0.3–1.2)
Total Protein: 7.4 g/dL (ref 6.5–8.1)

## 2019-01-09 LAB — CBC WITH DIFFERENTIAL/PLATELET
Abs Immature Granulocytes: 0.03 10*3/uL (ref 0.00–0.07)
Basophils Absolute: 0 10*3/uL (ref 0.0–0.1)
Basophils Relative: 0 %
Eosinophils Absolute: 0.3 10*3/uL (ref 0.0–0.5)
Eosinophils Relative: 2 %
HCT: 52.4 % — ABNORMAL HIGH (ref 39.0–52.0)
Hemoglobin: 16.8 g/dL (ref 13.0–17.0)
Immature Granulocytes: 0 %
Lymphocytes Relative: 32 %
Lymphs Abs: 3.5 10*3/uL (ref 0.7–4.0)
MCH: 29.4 pg (ref 26.0–34.0)
MCHC: 32.1 g/dL (ref 30.0–36.0)
MCV: 91.8 fL (ref 80.0–100.0)
Monocytes Absolute: 0.7 10*3/uL (ref 0.1–1.0)
Monocytes Relative: 7 %
Neutro Abs: 6.3 10*3/uL (ref 1.7–7.7)
Neutrophils Relative %: 59 %
Platelets: 229 10*3/uL (ref 150–400)
RBC: 5.71 MIL/uL (ref 4.22–5.81)
RDW: 13 % (ref 11.5–15.5)
WBC: 10.8 10*3/uL — ABNORMAL HIGH (ref 4.0–10.5)
nRBC: 0 % (ref 0.0–0.2)

## 2019-01-09 LAB — LIPASE, BLOOD: Lipase: 37 U/L (ref 11–51)

## 2019-01-09 MED ORDER — ONDANSETRON HCL 4 MG/2ML IJ SOLN
4.0000 mg | Freq: Once | INTRAMUSCULAR | Status: AC
  Administered 2019-01-09: 19:00:00 4 mg via INTRAVENOUS
  Filled 2019-01-09: qty 2

## 2019-01-09 MED ORDER — FENTANYL CITRATE (PF) 100 MCG/2ML IJ SOLN
100.0000 ug | Freq: Once | INTRAMUSCULAR | Status: AC
  Administered 2019-01-09: 100 ug via INTRAVENOUS
  Filled 2019-01-09: qty 2

## 2019-01-09 MED ORDER — MORPHINE SULFATE (PF) 4 MG/ML IV SOLN
4.0000 mg | Freq: Once | INTRAVENOUS | Status: AC
  Administered 2019-01-09: 4 mg via INTRAVENOUS
  Filled 2019-01-09: qty 1

## 2019-01-09 NOTE — ED Triage Notes (Addendum)
Pt reports epigastric pain "stabbing" x 1 hour which started after eating. Endorses nausea. Pt restless, moaning

## 2019-01-09 NOTE — ED Notes (Addendum)
Attempt x 1 for piv unsuccessful right AC. Blood return noted and labs obtained but catheter would not thread

## 2019-01-09 NOTE — ED Notes (Signed)
Patient very uncomfortable moaning in pain.  Rating pain at 20/10.  Medicated at this time.  To continue to monitor.

## 2019-01-09 NOTE — ED Notes (Signed)
Attempted report.  Nurse requested I call back in 10 minutes.  Patient updated.  Iv wrapped with kerlex for transport via POV.

## 2019-01-09 NOTE — ED Notes (Signed)
Wife updated at this time. 

## 2019-01-09 NOTE — ED Provider Notes (Signed)
[place] EMERGENCY DEPARTMENT Provider Note   CSN: [id number] Arrival date & time: 01/09/19  1842     History Chief Complaint  Patient presents with  . Abdominal Pain    Patrick Campbell is a 58 y.o. male.  58yo M w/ PMH including HTN, HLD, CKD, cholelithiasis, kidney stone who p/w epigastric pain.  Approximately 1 hour ago after eating dinner, patient had sudden onset of severe, constant, stabbing pain in his epigastric abdomen.  Pain is nonradiating.  He has had associated nausea.  He has never had pain like this before.  He denies any chest pain or shortness of breath.  No urinary symptoms. Denies heavy alcohol or NSAID use.  The history is provided by the patient.  Abdominal Pain      Past Medical History:  Diagnosis Date  . Acquired renal cyst of left kidney [id number]   Complicated cyst vs solid mass: contrast enhanced CT was recommended but ?apparently not done?  Marland Kitchen Atypical chest pain 05/2016   w/u in ED neg.  Saw Dr. Orene Desanctis for ED f/u and ETT planned but MD like the pain was likely biliary colic.  Pt never got the ETT.  Marland Kitchen Cholelithiasis    Noted on  CT abd.  No cholecystitis.  Reconfirmed on RUQ u/s 05/21/16.  Marland Kitchen Chronic renal insufficiency, stage II (mild)    Borderline stage II/III (CrCl estimate @ low 60s)  . [id number] virus detected 10/2018   + covid test on 10/2 and again on 10/12, 2020.  Marland Kitchen History of kidney stones   . Hyperlipidemia   . Hypertension   . Hypogonadism male 02/2010  . Left ureteral stone 2017   Saw urol for f/u 06/23/15 and plan was for him to return to them in 6 wks to get repeat u/s but he did not do this.  . Nephrolithiasis    left non-obstucive per ct 05-25-2015--67mm x 6 mm stone just beyond ureteropelvic jxn in prox L ureter, severe left hydronephrosis noted with kidney edema.  Pt then saw Dr. Alyson Ingles with urol and got ureteroscopy with stent placement.  . Polycythemia 05/10/2010   EPO level normal-- IDIOPATHIC  . Rectal bleeding  04/2016   Saw GI--dx of outlet bleeding, reassured, constipation tx advised.  . Urgency of urination   . Urticaria    Allergy w/u by Dr. Verlin Fester    Patient Active Problem List   Diagnosis Date Noted  . Urticaria 12/02/2017  . Angioedema 12/02/2017  . Seasonal allergic rhinitis 12/02/2017  . Hypernatremia 09/04/2017  . Rectal bleeding 04/30/2016  . Hydronephrosis of left kidney 05/26/2015  . Kidney stone 05/26/2015  . Ureteral stone 05/26/2015  . Left flank pain 05/25/2015  . Other malaise and fatigue 08/07/2012  . Health maintenance examination 07/15/2011  . Chronic headaches 06/12/2010  . Blurry vision 06/12/2010  . Chronic renal insufficiency 06/12/2010  . Polycythemia 05/10/2010  . Hyperlipidemia 05/09/2010  . Erectile dysfunction 05/09/2010  . HTN (hypertension), benign 05/09/2010  . Hypogonadism male     Past Surgical History:  Procedure Laterality Date  . COLONOSCOPY  09/30/11   Normal-repeat in 10 yrs  . CYSTOSCOPY WITH STENT PLACEMENT Left 05/29/2015   Procedure: CYSTOSCOPY WITH STENT PLACEMENT, RETROGRADE;  Surgeon: Cleon Gustin, MD;  Location: [facility];  Service: Urology;  Laterality: Left;  . CYSTOSCOPY/RETROGRADE/URETEROSCOPY/STONE EXTRACTION WITH BASKET Left 06/15/2015   Procedure: CYSTOSCOPY/RETROGRADE/URETEROSCOPY/STONE EXTRACTION WITH BASKET WITH STENT EXCHANGE;  Surgeon: Cleon Gustin, MD;  Location: [facility];  Service: Urology;  Laterality: Left;  . HOLMIUM LASER APPLICATION Left [id number]   Procedure: HOLMIUM LASER APPLICATION;  Surgeon: Cleon Gustin, MD;  Location: [facility];  Service: Urology;  Laterality: Left;  . KNEE SURGERY Right 2002       Family History  Problem Relation Age of Onset  . Hypertension Mother   . Food Allergy Mother   . Cancer Father        lung/ smoker  . Kidney disease Brother   . HIV Brother     Social History   Tobacco Use  . Smoking status: Never Smoker  . Smokeless  tobacco: Never Used  Substance Use Topics  . Alcohol use: Yes    Alcohol/week: 3.0 - 4.0 standard drinks    Types: 3 - 4 Glasses of wine per week    Comment: 2 glasses of wine 2 days a week  . Drug use: No    Home Medications Prior to Admission medications   Medication Sig Start Date End Date Taking? Authorizing Provider  amLODipine (NORVASC) 10 MG tablet Take 1 tablet (10 mg total) by mouth daily. 07/15/11 05/24/20  McGowen, Adrian Blackwater, MD  atorvastatin (LIPITOR) 40 MG tablet Take 40 mg by mouth daily. Take 1 tablet by mouth daily.    [provider]  fluticasone (FLONASE) 50 MCG/ACT nasal spray Place 2 sprays into both nostrils daily as needed for allergies or rhinitis. 12/02/17   Bobbitt, Sedalia Muta, MD  levocetirizine (XYZAL) 5 MG tablet Take 1 tablet (5 mg total) by mouth every evening. 12/02/17   Bobbitt, Sedalia Muta, MD  montelukast (SINGULAIR) 10 MG tablet Take 1 tablet (10 mg total) by mouth at bedtime. 12/02/17   Bobbitt, Sedalia Muta, MD  tiZANidine (ZANAFLEX) 4 MG capsule  08/01/18   [provider]  traZODone (DESYREL) 50 MG tablet Take 50 mg by mouth at bedtime as needed. 12/08/16   [provider]    Allergies    Patient has no known allergies.  Review of Systems   Review of Systems  Gastrointestinal: Positive for abdominal pain.   All other systems reviewed and are negative except that which was mentioned in HPI  Physical Exam Updated Vital Signs BP (!) 184/100 (BP Location: Right Arm)   Pulse 62   Temp 97.7 F (36.5 C) (Oral)   Resp 14   Ht 5\' 11"  (1.803 m)   Wt 95.3 kg   SpO2 100%   BMI 29.29 kg/m   Physical Exam Vitals and nursing note reviewed.  Constitutional:      General: He is in acute distress.     Appearance: He is well-developed.     Comments: Moaning, in pain  HENT:     Head: Normocephalic and atraumatic.  Eyes:     Conjunctiva/sclera: Conjunctivae normal.  Cardiovascular:     Rate and Rhythm: Normal rate and  regular rhythm.     Heart sounds: Normal heart sounds. No murmur.  Pulmonary:     Effort: Pulmonary effort is normal.     Breath sounds: Normal breath sounds.  Abdominal:     General: Bowel sounds are normal. There is no distension.     Tenderness: There is abdominal tenderness.     Comments: Limited 2/2 voluntary guarding; +epigastric and RUQ tenderness to palpation  Musculoskeletal:     Cervical back: Neck supple.  Skin:    General: Skin is warm and dry.  Neurological:     Mental Status: He is alert and oriented to  person, place, and time.     Comments: Fluent speech  Psychiatric:        Judgment: Judgment normal.     Comments: distressed     ED Results / Procedures / Treatments   Labs (all labs ordered are listed, but only abnormal results are displayed) Labs Reviewed  CBC WITH DIFFERENTIAL/PLATELET - Abnormal; Notable for the following components:      Result Value   WBC 10.8 (*)    HCT 52.4 (*)    All other components within normal limits  COMPREHENSIVE METABOLIC PANEL - Abnormal; Notable for the following components:   Glucose, Bld 120 (*)    Creatinine, Ser 1.28 (*)    All other components within normal limits  SARS CORONAVIRUS 2 (TAT 6-24 HRS)  LIPASE, BLOOD    EKG EKG Interpretation  Date/Time:  Saturday January 09 2019 18:46:02 EST Ventricular Rate:  61 PR Interval:  172 QRS Duration: 90 QT Interval:  414 QTC Calculation: 416 R Axis:   34 Text Interpretation: Normal sinus rhythm Normal ECG some artifact limiting interpretation but overall similar to previous Confirmed by Theotis Burrow [phone]) on 01/09/2019 7:05:23 PM   Radiology US Abdomen Limited RUQ  Result Date: 01/09/2019 CLINICAL DATA:  RIGHT upper quadrant and epigastric pain with nausea today, positive Murphy sign on exam, history of gallstones EXAM: ULTRASOUND ABDOMEN LIMITED RIGHT UPPER QUADRANT COMPARISON:  05/22/2016 FINDINGS: Gallbladder: 10 mm diameter shadowing calculus at gallbladder  neck, non mobile. No gallbladder wall thickening or pericholecystic fluid. Sonographic Murphy sign present. Common bile duct: Diameter: 4 mm, normal Liver: Normal echogenicity without mass or nodularity. No intrahepatic biliary dilatation. Portal vein is patent on color Doppler imaging with normal direction of blood flow towards the liver. Other: No RIGHT upper quadrant free fluid. IMPRESSION: 10 mm diameter gallstone fixed in position at the gallbladder neck. No gallbladder wall thickening or pericholecystic fluid are identified though patient did demonstrate a sonographic Murphy sign with transducer pressure. Early acute cholecystitis not excluded. Electronically Signed   By: Lavonia Dana M.D.   On: 01/09/2019 21:08    Procedures Procedures (including critical care time)  Medications Ordered in ED Medications  fentaNYL (SUBLIMAZE) injection 100 mcg (has no administration in time range)  ondansetron (ZOFRAN) injection 4 mg (has no administration in time range)    ED Course  I have reviewed the triage vital signs and the nursing notes.  Pertinent labs & imaging results that were available during my care of the patient were reviewed by me and considered in my medical decision making (see chart for details).    MDM Rules/Calculators/A&P                      Pt in distress due to pain, epigastric and right upper quadrant tenderness noted.  Differential includes gallstones, cholecystitis, pancreatitis, or less likely aortic dissection.  Lab work shows creatinine 1.28, normal LFTs and lipase, WBC 10.8.  Right upper quadrant ultrasound shows 10 mm gallstone stuck in gallbladder neck.  Patient at risk for cholecystitis.  Discussed with general surgery, Dr. Ninfa Linden, who has accepted the patient in transfer to [facility] where he will be admitted for further care.  Pt much more comfortable on reassessment prior to transfer.  He has requested to transfer by private vehicle with his wife driving.  His last dose  of pain medicine was over 1.5 hours ago and he is alert with reassuring VS. He understands risks of private transfer and feels comfortable  with this plan. Final Clinical Impression(s) / ED Diagnoses Final diagnoses:  None    Rx / DC Orders ED Discharge Orders    None       Little, Wenda Overland, MD 01/09/19 2359

## 2019-01-09 NOTE — ED Notes (Signed)
Taken to US.

## 2019-01-10 ENCOUNTER — Observation Stay (HOSPITAL_COMMUNITY): Payer: Commercial Managed Care - PPO | Admitting: Anesthesiology

## 2019-01-10 ENCOUNTER — Encounter (HOSPITAL_COMMUNITY)
Admission: EM | Disposition: A | Discharge status: Home / Self Care | Payer: Self-pay | Source: Home / Self Care | Attending: Emergency Medicine

## 2019-01-10 ENCOUNTER — Encounter (HOSPITAL_COMMUNITY): Payer: Self-pay | Admitting: Surgery

## 2019-01-10 HISTORY — PX: CHOLECYSTECTOMY: SHX55

## 2019-01-10 LAB — SARS CORONAVIRUS 2 (TAT 6-24 HRS): SARS Coronavirus 2: NEGATIVE

## 2019-01-10 LAB — SURGICAL PCR SCREEN
MRSA, PCR: NEGATIVE
Staphylococcus aureus: NEGATIVE

## 2019-01-10 LAB — HIV ANTIBODY (ROUTINE TESTING W REFLEX): HIV Screen 4th Generation wRfx: NONREACTIVE

## 2019-01-10 SURGERY — LAPAROSCOPIC CHOLECYSTECTOMY
Anesthesia: General | Site: Abdomen

## 2019-01-10 MED ORDER — BUPIVACAINE HCL (PF) 0.5 % IJ SOLN
INTRAMUSCULAR | Status: DC | PRN
  Administered 2019-01-10: 20 mL

## 2019-01-10 MED ORDER — MIDAZOLAM HCL 5 MG/5ML IJ SOLN
INTRAMUSCULAR | Status: DC | PRN
  Administered 2019-01-10: 2 mg via INTRAVENOUS

## 2019-01-10 MED ORDER — OXYCODONE HCL 5 MG PO TABS
5.0000 mg | ORAL_TABLET | ORAL | Status: DC | PRN
  Administered 2019-01-10: 5 mg via ORAL
  Filled 2019-01-10: qty 1

## 2019-01-10 MED ORDER — SUCCINYLCHOLINE CHLORIDE 200 MG/10ML IV SOSY
PREFILLED_SYRINGE | INTRAVENOUS | Status: AC
  Filled 2019-01-10: qty 10

## 2019-01-10 MED ORDER — FENTANYL CITRATE (PF) 100 MCG/2ML IJ SOLN
25.0000 ug | INTRAMUSCULAR | Status: DC | PRN
  Administered 2019-01-10: 25 ug via INTRAVENOUS

## 2019-01-10 MED ORDER — SUGAMMADEX SODIUM 200 MG/2ML IV SOLN
INTRAVENOUS | Status: DC | PRN
  Administered 2019-01-10: 190.6 mg via INTRAVENOUS

## 2019-01-10 MED ORDER — 0.9 % SODIUM CHLORIDE (POUR BTL) OPTIME
TOPICAL | Status: DC | PRN
  Administered 2019-01-10: 1000 mL

## 2019-01-10 MED ORDER — FENTANYL CITRATE (PF) 250 MCG/5ML IJ SOLN
INTRAMUSCULAR | Status: AC
  Filled 2019-01-10: qty 5

## 2019-01-10 MED ORDER — DIPHENHYDRAMINE HCL 25 MG PO CAPS: 25.0000 mg | ORAL_CAPSULE | Freq: Four times a day (QID) | ORAL | Status: DC | PRN

## 2019-01-10 MED ORDER — LACTATED RINGERS IR SOLN
Status: DC | PRN
  Administered 2019-01-10: 1000 mL

## 2019-01-10 MED ORDER — ACETAMINOPHEN 160 MG/5ML PO SOLN: 1000.0000 mg | Freq: Once | ORAL | Status: DC | PRN

## 2019-01-10 MED ORDER — ACETAMINOPHEN 500 MG PO TABS: 1000.0000 mg | ORAL_TABLET | Freq: Once | ORAL | Status: DC | PRN

## 2019-01-10 MED ORDER — TRAZODONE HCL 50 MG PO TABS
50.0000 mg | ORAL_TABLET | Freq: Every evening | ORAL | Status: DC | PRN
  Administered 2019-01-10: 50 mg via ORAL
  Filled 2019-01-10: qty 1

## 2019-01-10 MED ORDER — PROPOFOL 10 MG/ML IV BOLUS
INTRAVENOUS | Status: DC | PRN
  Administered 2019-01-10: 140 ug via INTRAVENOUS

## 2019-01-10 MED ORDER — CELECOXIB 200 MG PO CAPS
ORAL_CAPSULE | ORAL | Status: AC
  Filled 2019-01-10: qty 1

## 2019-01-10 MED ORDER — ACETAMINOPHEN 10 MG/ML IV SOLN: 1000.0000 mg | Freq: Once | INTRAVENOUS | Status: DC | PRN

## 2019-01-10 MED ORDER — ROCURONIUM BROMIDE 10 MG/ML (PF) SYRINGE
PREFILLED_SYRINGE | INTRAVENOUS | Status: AC
  Filled 2019-01-10: qty 10

## 2019-01-10 MED ORDER — FENTANYL CITRATE (PF) 100 MCG/2ML IJ SOLN
INTRAMUSCULAR | Status: AC
  Filled 2019-01-10: qty 2

## 2019-01-10 MED ORDER — BUPIVACAINE HCL (PF) 0.5 % IJ SOLN
INTRAMUSCULAR | Status: AC
  Filled 2019-01-10: qty 30

## 2019-01-10 MED ORDER — ROCURONIUM BROMIDE 100 MG/10ML IV SOLN
INTRAVENOUS | Status: DC | PRN
  Administered 2019-01-10: 20 mg via INTRAVENOUS
  Administered 2019-01-10: 10 mg via INTRAVENOUS

## 2019-01-10 MED ORDER — LACTATED RINGERS IV SOLN: INTRAVENOUS | Status: DC

## 2019-01-10 MED ORDER — ACETAMINOPHEN 500 MG PO TABS
1000.0000 mg | ORAL_TABLET | Freq: Once | ORAL | Status: AC
  Administered 2019-01-10: 08:00:00 1000 mg via ORAL

## 2019-01-10 MED ORDER — FENTANYL CITRATE (PF) 250 MCG/5ML IJ SOLN
INTRAMUSCULAR | Status: DC | PRN
  Administered 2019-01-10: 50 ug via INTRAVENOUS
  Administered 2019-01-10: 100 ug via INTRAVENOUS
  Administered 2019-01-10 (×2): 50 ug via INTRAVENOUS

## 2019-01-10 MED ORDER — ENOXAPARIN SODIUM 40 MG/0.4ML ~~LOC~~ SOLN
40.0000 mg | Freq: Every day | SUBCUTANEOUS | Status: DC
  Administered 2019-01-10: 40 mg via SUBCUTANEOUS
  Filled 2019-01-10: qty 0.4

## 2019-01-10 MED ORDER — SODIUM CHLORIDE 0.9 % IV SOLN
2.0000 g | Freq: Every day | INTRAVENOUS | Status: DC
  Administered 2019-01-10: 2 g via INTRAVENOUS
  Filled 2019-01-10: qty 2
  Filled 2019-01-10: qty 20

## 2019-01-10 MED ORDER — OXYCODONE HCL 5 MG PO TABS: 5.0000 mg | ORAL_TABLET | Freq: Once | ORAL | Status: DC | PRN

## 2019-01-10 MED ORDER — ONDANSETRON 4 MG PO TBDP: 4.0000 mg | ORAL_TABLET | Freq: Four times a day (QID) | ORAL | Status: DC | PRN

## 2019-01-10 MED ORDER — OXYCODONE HCL 5 MG/5ML PO SOLN: 5.0000 mg | Freq: Once | ORAL | Status: DC | PRN

## 2019-01-10 MED ORDER — DIPHENHYDRAMINE HCL 50 MG/ML IJ SOLN: 25.0000 mg | Freq: Four times a day (QID) | INTRAMUSCULAR | Status: DC | PRN

## 2019-01-10 MED ORDER — GABAPENTIN 300 MG PO CAPS
300.0000 mg | ORAL_CAPSULE | Freq: Once | ORAL | Status: AC
  Administered 2019-01-10: 300 mg via ORAL
  Filled 2019-01-10: qty 1

## 2019-01-10 MED ORDER — DEXAMETHASONE SODIUM PHOSPHATE 10 MG/ML IJ SOLN
INTRAMUSCULAR | Status: AC
  Filled 2019-01-10: qty 1

## 2019-01-10 MED ORDER — SUCCINYLCHOLINE CHLORIDE 20 MG/ML IJ SOLN
INTRAMUSCULAR | Status: DC | PRN
  Administered 2019-01-10: 100 mg via INTRAVENOUS

## 2019-01-10 MED ORDER — ONDANSETRON HCL 4 MG/2ML IJ SOLN: 4.0000 mg | Freq: Four times a day (QID) | INTRAMUSCULAR | Status: DC | PRN

## 2019-01-10 MED ORDER — AMLODIPINE BESYLATE 10 MG PO TABS: 10.0000 mg | ORAL_TABLET | Freq: Every day | ORAL | Status: DC

## 2019-01-10 MED ORDER — POTASSIUM CHLORIDE IN NACL 20-0.9 MEQ/L-% IV SOLN: INTRAVENOUS | Status: DC

## 2019-01-10 MED ORDER — ACETAMINOPHEN 500 MG PO TABS
ORAL_TABLET | ORAL | Status: AC
  Filled 2019-01-10: qty 2

## 2019-01-10 MED ORDER — ONDANSETRON HCL 4 MG/2ML IJ SOLN
INTRAMUSCULAR | Status: DC | PRN
  Administered 2019-01-10: 4 mg via INTRAVENOUS

## 2019-01-10 MED ORDER — ONDANSETRON HCL 4 MG/2ML IJ SOLN
INTRAMUSCULAR | Status: AC
  Filled 2019-01-10: qty 2

## 2019-01-10 MED ORDER — SUGAMMADEX SODIUM 500 MG/5ML IV SOLN
INTRAVENOUS | Status: AC
  Filled 2019-01-10: qty 5

## 2019-01-10 MED ORDER — POTASSIUM CHLORIDE IN NACL 20-0.9 MEQ/L-% IV SOLN
INTRAVENOUS | Status: DC
  Filled 2019-01-10: qty 1000

## 2019-01-10 MED ORDER — PROPOFOL 10 MG/ML IV BOLUS
INTRAVENOUS | Status: AC
  Filled 2019-01-10: qty 20

## 2019-01-10 MED ORDER — LIDOCAINE 2% (20 MG/ML) 5 ML SYRINGE
INTRAMUSCULAR | Status: AC
  Filled 2019-01-10: qty 5

## 2019-01-10 MED ORDER — MIDAZOLAM HCL 2 MG/2ML IJ SOLN
INTRAMUSCULAR | Status: AC
  Filled 2019-01-10: qty 2

## 2019-01-10 MED ORDER — LIDOCAINE HCL (CARDIAC) PF 100 MG/5ML IV SOSY
PREFILLED_SYRINGE | INTRAVENOUS | Status: DC | PRN
  Administered 2019-01-10: 80 mg via INTRATRACHEAL

## 2019-01-10 MED ORDER — MORPHINE SULFATE (PF) 2 MG/ML IV SOLN: 2.0000 mg | INTRAVENOUS | Status: DC | PRN

## 2019-01-10 MED ORDER — LACTATED RINGERS IV SOLN: INTRAVENOUS | Status: DC | PRN

## 2019-01-10 MED ORDER — CELECOXIB 200 MG PO CAPS
200.0000 mg | ORAL_CAPSULE | Freq: Once | ORAL | Status: AC
  Administered 2019-01-10: 200 mg via ORAL

## 2019-01-10 MED ORDER — OXYCODONE HCL 5 MG PO TABS: 5.0000 mg | ORAL_TABLET | Freq: Four times a day (QID) | ORAL | 0 refills | Status: DC | PRN

## 2019-01-10 MED ORDER — DEXAMETHASONE SODIUM PHOSPHATE 10 MG/ML IJ SOLN
INTRAMUSCULAR | Status: DC | PRN
  Administered 2019-01-10: 8 mg via INTRAVENOUS

## 2019-01-10 SURGICAL SUPPLY — 34 items
ADH SKN CLS APL DERMABOND .7 (GAUZE/BANDAGES/DRESSINGS) ×1
APL PRP STRL LF DISP 70% ISPRP (MISCELLANEOUS) ×1
APPLIER CLIP 5 13 M/L LIGAMAX5 (MISCELLANEOUS) ×2
APR CLP MED LRG 5 ANG JAW (MISCELLANEOUS) ×1
BAG SPEC RTRVL LRG 6X4 10 (ENDOMECHANICALS) ×1
CABLE HIGH FREQUENCY MONO STRZ (ELECTRODE) ×2 IMPLANT
CHLORAPREP W/TINT 26 (MISCELLANEOUS) ×2 IMPLANT
CLIP APPLIE 5 13 M/L LIGAMAX5 (MISCELLANEOUS) ×1 IMPLANT
COVER MAYO STAND STRL (DRAPES) IMPLANT
COVER WAND RF STERILE (DRAPES) IMPLANT
DECANTER SPIKE VIAL GLASS SM (MISCELLANEOUS) ×2 IMPLANT
DERMABOND ADVANCED (GAUZE/BANDAGES/DRESSINGS) ×1
DERMABOND ADVANCED .7 DNX12 (GAUZE/BANDAGES/DRESSINGS) ×1 IMPLANT
DRAPE C-ARM 42X120 X-RAY (DRAPES) IMPLANT
ELECT REM PT RETURN 15FT ADLT (MISCELLANEOUS) ×2 IMPLANT
GLOVE SURG SIGNA 7.5 PF LTX (GLOVE) ×2 IMPLANT
GOWN STRL REUS W/TWL XL LVL3 (GOWN DISPOSABLE) ×4 IMPLANT
HEMOSTAT SURGICEL 4X8 (HEMOSTASIS) IMPLANT
KIT BASIN OR (CUSTOM PROCEDURE TRAY) ×2 IMPLANT
KIT TURNOVER KIT A (KITS) ×1 IMPLANT
PENCIL SMOKE EVACUATOR (MISCELLANEOUS) IMPLANT
POUCH SPECIMEN RETRIEVAL 10MM (ENDOMECHANICALS) ×2 IMPLANT
SCISSORS LAP 5X35 DISP (ENDOMECHANICALS) ×2 IMPLANT
SET CHOLANGIOGRAPH MIX (MISCELLANEOUS) IMPLANT
SET IRRIG TUBING LAPAROSCOPIC (IRRIGATION / IRRIGATOR) ×2 IMPLANT
SET TUBE SMOKE EVAC HIGH FLOW (TUBING) ×2 IMPLANT
SLEEVE XCEL OPT CAN 5 100 (ENDOMECHANICALS) ×4 IMPLANT
SUT MNCRL AB 4-0 PS2 18 (SUTURE) ×2 IMPLANT
SUT VICRYL 0 ENDOLOOP (SUTURE) ×2 IMPLANT
TOWEL OR 17X26 10 PK STRL BLUE (TOWEL DISPOSABLE) ×2 IMPLANT
TOWEL OR NON WOVEN STRL DISP B (DISPOSABLE) ×2 IMPLANT
TRAY LAPAROSCOPIC (CUSTOM PROCEDURE TRAY) ×2 IMPLANT
TROCAR BLADELESS OPT 5 100 (ENDOMECHANICALS) ×2 IMPLANT
TROCAR XCEL BLUNT TIP 100MML (ENDOMECHANICALS) ×2 IMPLANT

## 2019-01-10 NOTE — Transfer of Care (Signed)
Immediate Anesthesia Transfer of Care Note  Patient: Patrick Campbell  Procedure(s) Performed: Procedure(s): LAPAROSCOPIC CHOLECYSTECTOMY (N/A)  Patient Location: PACU  Anesthesia Type:General  Level of Consciousness:  sedated, patient cooperative and responds to stimulation  Airway & Oxygen Therapy:Patient Spontanous Breathing and Patient connected to face mask oxgen  Post-op Assessment:  Report given to PACU RN and Post -op Vital signs reviewed and stable  Post vital signs:  Reviewed and stable  Last Vitals:  Vitals:   01/10/19 0505 01/10/19 0949  BP: 136/82   Pulse: (!) 57 (P) 82  Resp: 16   Temp: 36.7 C (P) 36.6 C  SpO2: [id number]     Complications: No apparent anesthesia complications

## 2019-01-10 NOTE — Anesthesia Procedure Notes (Signed)
Procedure Name: Intubation Date/Time: 01/10/2019 9:01 AM Performed by: Anne Fu, CRNA Pre-anesthesia Checklist: Patient identified, Emergency Drugs available, Suction available, Patient being monitored and Timeout performed Patient Re-evaluated:Patient Re-evaluated prior to induction Oxygen Delivery Method: Circle system utilized Preoxygenation: Pre-oxygenation with 100% oxygen Induction Type: IV induction, Rapid sequence and Cricoid Pressure applied Laryngoscope Size: Mac and 4 Grade View: Grade III Tube type: Oral Tube size: 7.5 mm Number of attempts: 1 Airway Equipment and Method: Stylet Placement Confirmation: ETT inserted through vocal cords under direct vision,  positive ETCO2 and breath sounds checked- equal and bilateral Secured at: 21 cm Tube secured with: Tape Dental Injury: Teeth and Oropharynx as per pre-operative assessment

## 2019-01-10 NOTE — Anesthesia Preprocedure Evaluation (Addendum)
Anesthesia Evaluation  Patient identified by MRN, date of birth, ID band Patient awake    Reviewed: Allergy & Precautions, NPO status , Patient's Chart, lab work & pertinent test results  History of Anesthesia Complications Negative for: history of anesthetic complications  Airway Mallampati: III  TM Distance: >3 FB Neck ROM: Full    Dental  (+) Dental Advisory Given, Teeth Intact   Pulmonary neg sleep apnea, neg COPD, neg recent URI,    breath sounds clear to auscultation       Cardiovascular hypertension, Pt. on medications  Rhythm:Regular     Neuro/Psych  Headaches, negative psych ROS   GI/Hepatic Neg liver ROS, gallstones   Endo/Other  negative endocrine ROS  Renal/GU Renal InsufficiencyRenal disease     Musculoskeletal negative musculoskeletal ROS (+)   Abdominal   Peds  Hematology negative hematology ROS (+)   Anesthesia Other Findings   Reproductive/Obstetrics                            Anesthesia Physical Anesthesia Plan  ASA: II  Anesthesia Plan: General   Post-op Pain Management:    Induction: Intravenous  PONV Risk Score and Plan: 2 and Dexamethasone and Ondansetron  Airway Management Planned: Oral ETT  Additional Equipment: None  Intra-op Plan:   Post-operative Plan: Extubation in OR  Informed Consent: I have reviewed the patients History and Physical, chart, labs and discussed the procedure including the risks, benefits and alternatives for the proposed anesthesia with the patient or authorized representative who has indicated his/her understanding and acceptance.     Dental advisory given  Plan Discussed with: CRNA and Surgeon  Anesthesia Plan Comments:         Anesthesia Quick Evaluation

## 2019-01-10 NOTE — Progress Notes (Signed)
Patient ID: Patrick Campbell, male   DOB: 08/29/1960, 58 y.o.   MRN: [id number]   I discussed the procedure in detail.    We discussed the risks and benefits of a laparoscopic cholecystectomy and possible cholangiogram including, but not limited to bleeding, infection, injury to surrounding structures such as the intestine or liver, bile leak, retained gallstones, need to convert to an open procedure, prolonged diarrhea, blood clots such as  DVT, common bile duct injury, anesthesia risks, and possible need for additional procedures.  The likelihood of improvement in symptoms and return to the patient's normal status is good. We discussed the typical post-operative recovery course.

## 2019-01-10 NOTE — Discharge Summary (Signed)
Physician Discharge Summary  Patient ID: Patrick Campbell MRN: [id number] DOB/AGE: [birth year] 57 y.o.  Admit date: 01/09/2019 Discharge date: 01/10/2019  Admission Diagnoses:  Discharge Diagnoses:  Active Problems:   Cholelithiasis with cholecystitis   Discharged Condition: good  Hospital Course: uneventful post op recovery.  Discharged home day of surgery  Consults: None  Significant Diagnostic Studies: angiography:   Treatments: surgery: laparoscopic cholecystectomy  Discharge Exam: Blood pressure (!) 142/78, pulse 76, temperature 98.5 F (36.9 C), temperature source Oral, resp. rate 16, height 5\' 11"  (1.803 m), weight 95.3 kg, SpO2 95 %. General appearance: alert, cooperative and no distress Resp: clear to auscultation bilaterally Cardio: regular rate and rhythm, S1, S2 normal, no murmur, click, rub or gallop Incision/Wound:abdomen soft, incisions clean  Disposition: Discharge disposition: 01-Home or Self Care       Discharge Instructions    Diet - low sodium heart healthy   Complete by: As directed    Increase activity slowly   Complete by: As directed      Allergies as of 01/10/2019   No Known Allergies     Medication List    STOP taking these medications   fluticasone 50 MCG/ACT nasal spray Commonly known as: Flonase   levocetirizine 5 MG tablet Commonly known as: XYZAL   montelukast 10 MG tablet Commonly known as: SINGULAIR     TAKE these medications   amLODipine 10 MG tablet Commonly known as: NORVASC Take 1 tablet (10 mg total) by mouth daily.   atorvastatin 40 MG tablet Commonly known as: LIPITOR Take 40 mg by mouth daily. Take 1 tablet by mouth daily.   neomycin-polymyxin b-dexamethasone 3.5-10000-0.1 Oint Commonly known as: MAXITROL Place 1 application into both eyes 2 (two) times daily.   oxyCODONE 5 MG immediate release tablet Commonly known as: Oxy IR/ROXICODONE Take 1 tablet (5 mg total) by mouth every 6 (six) hours as needed  for moderate pain, severe pain or breakthrough pain.   traZODone 50 MG tablet Commonly known as: DESYREL Take 50 mg by mouth at bedtime as needed for sleep.      Follow-up Information    Coralie Keens, MD. Schedule an appointment as soon as possible for a visit in 3 week(s).   Specialty: General Surgery Contact information: [place] [address] [phone]           Signed: Coralie Keens 01/10/2019, 4:21 PM

## 2019-01-10 NOTE — H&P (Signed)
Patrick [place] is an 58 y.o. male.   Chief Complaint: Right upper quadrant abdominal pain HPI: This gentleman was transferred from [facility].  He presented there with a 1 hour history of right upper quadrant abdominal pain which he described as severe and constant.  He denies nausea or vomiting.  He has a known history of gallstones and kidney stones.  He underwent an ultrasound of the right upper quadrant which showed a 1 cm gallstone in the gallbladder neck which was nonmobile.  Bile duct was normal.  White blood count was slightly elevated.  Liver function tests were normal.  He had tested positive for Covid back in October.  His test today is now negative.  He has no cough, chest pain, fever, or shortness of breath.  This morning, he reports that his pain has improved.  Past Medical History:  Diagnosis Date  . Acquired renal cyst of left kidney [id number]   Complicated cyst vs solid mass: contrast enhanced CT was recommended but ?apparently not done?  Marland Kitchen Atypical chest pain 05/2016   w/u in ED neg.  Saw Dr. Orene Desanctis for ED f/u and ETT planned but MD like the pain was likely biliary colic.  Pt never got the ETT.  Marland [place] Cholelithiasis    Noted on  CT abd.  No cholecystitis.  Reconfirmed on RUQ u/s 05/21/16.  Marland [place] Chronic renal insufficiency, stage II (mild)    Borderline stage II/III (CrCl estimate @ low 60s)  . [id number] virus detected 10/2018   + covid test on 10/2 and again on 10/12, 2020.  Marland [place] History of kidney stones   . Hyperlipidemia   . Hypertension   . Hypogonadism male 02/2010  . Left ureteral stone 2017   Saw urol for f/u 06/23/15 and plan was for him to return to them in 6 wks to get repeat u/s but he did not do this.  . Nephrolithiasis    left non-obstucive per ct 05-25-2015--83mm x 6 mm stone just beyond ureteropelvic jxn in prox L ureter, severe left hydronephrosis noted with kidney edema.  Pt then saw Dr. Alyson Ingles with urol and got ureteroscopy with stent placement.  . Polycythemia  05/10/2010   EPO level normal-- IDIOPATHIC  . Rectal bleeding 04/2016   Saw GI--dx of outlet bleeding, reassured, constipation tx advised.  . Urgency of urination   . Urticaria    Allergy w/u by Dr. Verlin Fester    Past Surgical History:  Procedure Laterality Date  . COLONOSCOPY  09/30/11   Normal-repeat in 10 yrs  . CYSTOSCOPY WITH STENT PLACEMENT Left 05/29/2015   Procedure: CYSTOSCOPY WITH STENT PLACEMENT, RETROGRADE;  Surgeon: Cleon Gustin, MD;  Location: [facility];  Service: Urology;  Laterality: Left;  . CYSTOSCOPY/RETROGRADE/URETEROSCOPY/STONE EXTRACTION WITH BASKET Left 06/15/2015   Procedure: CYSTOSCOPY/RETROGRADE/URETEROSCOPY/STONE EXTRACTION WITH BASKET WITH STENT EXCHANGE;  Surgeon: Cleon Gustin, MD;  Location: [facility];  Service: Urology;  Laterality: Left;  . HOLMIUM LASER APPLICATION Left [id number]   Procedure: HOLMIUM LASER APPLICATION;  Surgeon: Cleon Gustin, MD;  Location: [facility];  Service: Urology;  Laterality: Left;  . KNEE SURGERY Right 2002    Family History  Problem Relation Age of Onset  . Hypertension Mother   . Food Allergy Mother   . Cancer Father        lung/ smoker  . Kidney disease Brother   . HIV Brother    Social History:  reports that he has never smoked. He has never  used smokeless tobacco. He reports current alcohol use of about 3.0 - 4.0 standard drinks of alcohol per week. He reports that he does not use drugs.  Allergies: No Known Allergies  Medications Prior to Admission  Medication Sig Dispense Refill  . amLODipine (NORVASC) 10 MG tablet Take 1 tablet (10 mg total) by mouth daily. 90 tablet 2  . atorvastatin (LIPITOR) 40 MG tablet Take 40 mg by mouth daily. Take 1 tablet by mouth daily.    Marland Kitchen neomycin-polymyxin b-dexamethasone (MAXITROL) 3.5-10000-0.1 OINT Place 1 application into both eyes 2 (two) times daily.     . traZODone (DESYREL) 50 MG tablet Take 50 mg by mouth at bedtime as needed for  sleep.   4  . fluticasone (FLONASE) 50 MCG/ACT nasal spray Place 2 sprays into both nostrils daily as needed for allergies or rhinitis. (Patient not taking: Reported on 01/10/2019) 1 g 5  . levocetirizine (XYZAL) 5 MG tablet Take 1 tablet (5 mg total) by mouth every evening. (Patient not taking: Reported on 01/10/2019) 30 tablet 5  . montelukast (SINGULAIR) 10 MG tablet Take 1 tablet (10 mg total) by mouth at bedtime. (Patient not taking: Reported on 01/10/2019) 30 tablet 5    Results for orders placed or performed during the hospital encounter of 01/09/19 (from the past 48 hour(s))  CBC with Differential     Status: Abnormal   Collection Time: 01/09/19  7:03 PM  Result Value Ref Range   WBC 10.8 (H) 4.0 - 10.5 K/uL   RBC 5.71 4.22 - 5.81 MIL/uL   Hemoglobin 16.8 13.0 - 17.0 g/dL   HCT 52.4 (H) 39.0 - 52.0 %   MCV 91.8 80.0 - 100.0 fL   MCH 29.4 26.0 - 34.0 pg   MCHC 32.1 30.0 - 36.0 g/dL   RDW 13.0 11.5 - 15.5 %   Platelets 229 150 - 400 K/uL   nRBC 0.0 0.0 - 0.2 %   Neutrophils Relative % 59 %   Neutro Abs 6.3 1.7 - 7.7 K/uL   Lymphocytes Relative 32 %   Lymphs Abs 3.5 0.7 - 4.0 K/uL   Monocytes Relative 7 %   Monocytes Absolute 0.7 0.1 - 1.0 K/uL   Eosinophils Relative 2 %   Eosinophils Absolute 0.3 0.0 - 0.5 K/uL   Basophils Relative 0 %   Basophils Absolute 0.0 0.0 - 0.1 K/uL   Immature Granulocytes 0 %   Abs Immature Granulocytes 0.03 0.00 - 0.07 K/uL    Comment: Performed at [facility], [address] [place], [place] [address]  Comprehensive metabolic panel     Status: Abnormal   Collection Time: 01/09/19  7:45 PM  Result Value Ref Range   Sodium 138 135 - 145 mmol/L   Potassium 3.7 3.5 - 5.1 mmol/L   Chloride 106 98 - 111 mmol/L   CO2 22 22 - 32 mmol/L   Glucose, Bld 120 (H) 70 - 99 mg/dL   BUN 17 6 - 20 mg/dL   Creatinine, Ser 1.28 (H) 0.61 - 1.24 mg/dL   Calcium 9.5 8.9 - 10.3 mg/dL   Total Protein 7.4 6.5 - 8.1 g/dL   Albumin 4.3 3.5 - 5.0 g/dL    AST 24 15 - 41 U/L   ALT 39 0 - 44 U/L   Alkaline Phosphatase 57 38 - 126 U/L   Total Bilirubin 0.3 0.3 - 1.2 mg/dL   GFR calc non Af Amer >60 >60 mL/min   GFR calc Af Amer >  60 >60 mL/min   Anion gap 10 5 - 15    Comment: Performed at [facility], [address] [place], [place] [address]  Lipase, blood     Status: None   Collection Time: 01/09/19  7:45 PM  Result Value Ref Range   Lipase 37 11 - 51 U/L    Comment: Performed at [facility], [address] [place], [place] [address]  SARS CORONAVIRUS 2 (TAT 6-24 HRS) Nasopharyngeal Nasopharyngeal Swab     Status: None   Collection Time: 01/09/19 10:12 PM   Specimen: Nasopharyngeal Swab  Result Value Ref Range   SARS Coronavirus 2 NEGATIVE NEGATIVE    Comment: (NOTE) SARS-CoV-2 target nucleic acids are NOT DETECTED. The SARS-CoV-2 RNA is generally detectable in upper and lower respiratory specimens during the acute phase of infection. Negative results do not preclude SARS-CoV-2 infection, do not rule out co-infections with other pathogens, and should not be used as the sole basis for treatment or other patient management decisions. Negative results must be combined with clinical observations, patient history, and epidemiological information. The expected result is Negative. Fact Sheet for Patients: SugarRoll.be Fact Sheet for Healthcare Providers: https://www.woods-mathews.com/ This test is not yet approved or cleared by the [place] FDA and  has been authorized for detection and/or diagnosis of SARS-CoV-2 by FDA under an Emergency Use Authorization (EUA). This EUA will remain  in effect (meaning this test can be used) for the duration of the COVID-19 declaration under Section 56 4(b)(1) of the Act, 21 U.S.C. section 360bbb-3(b)(1), unless the authorization is terminated or revoked sooner. Performed at [facility], [place] [address], [place],  [place] [address]   Surgical PCR screen     Status: None   Collection Time: 01/10/19  1:32 AM   Specimen: Nasal Mucosa; Nasal Swab  Result Value Ref Range   MRSA, PCR NEGATIVE NEGATIVE   Staphylococcus aureus NEGATIVE NEGATIVE    Comment: (NOTE) The Xpert SA Assay (FDA approved for NASAL specimens in patients 19 years of age and older), is one component of a comprehensive surveillance program. It is not intended to diagnose infection nor to guide or monitor treatment. Performed at [facility], [place] [address] [place], [place] [address]    US Abdomen Limited RUQ  Result Date: 01/09/2019 CLINICAL DATA:  RIGHT upper quadrant and epigastric pain with nausea today, positive Murphy sign on exam, history of gallstones EXAM: ULTRASOUND ABDOMEN LIMITED RIGHT UPPER QUADRANT COMPARISON:  05/22/2016 FINDINGS: Gallbladder: 10 mm diameter shadowing calculus at gallbladder neck, non mobile. No gallbladder wall thickening or pericholecystic fluid. Sonographic Murphy sign present. Common bile duct: Diameter: 4 mm, normal Liver: Normal echogenicity without mass or nodularity. No intrahepatic biliary dilatation. Portal vein is patent on color Doppler imaging with normal direction of blood flow towards the liver. Other: No RIGHT upper quadrant free fluid. IMPRESSION: 10 mm diameter gallstone fixed in position at the gallbladder neck. No gallbladder wall thickening or pericholecystic fluid are identified though patient did demonstrate a sonographic Murphy sign with transducer pressure. Early acute cholecystitis not excluded. Electronically Signed   By: Lavonia Dana M.D.   On: 01/09/2019 21:08    Review of Systems  Constitutional: Negative for chills and fever.  Respiratory: Negative for cough and shortness of breath.   Cardiovascular: Negative for chest pain.  Gastrointestinal: Positive for abdominal pain. Negative for diarrhea, nausea and vomiting.  Genitourinary: Negative for dysuria.  All other  systems reviewed  and are negative.   Blood pressure 136/82, pulse (!) 57, temperature 98 F (36.7 C), temperature source Oral, resp. rate 16, height 5\' 11"  (1.803 m), weight 95.3 kg, SpO2 98 %. Physical Exam  Constitutional: He is oriented to person, place, and time. He appears well-developed and well-nourished. No distress.  HENT:  Head: Normocephalic and atraumatic.  Right Ear: External ear normal.  Left Ear: External ear normal.  Nose: Nose normal.  Mouth/Throat: No oropharyngeal exudate.  Eyes: Pupils are equal, round, and reactive to light. Right eye exhibits no discharge. Left eye exhibits no discharge. No scleral icterus.  Neck: No tracheal deviation present.  Cardiovascular: Normal rate, regular rhythm, normal heart sounds and intact distal pulses.  No murmur heard. Respiratory: Effort normal and breath sounds normal. No respiratory distress.  GI: Soft. He exhibits no distension. There is abdominal tenderness.  There is mild tenderness with guarding in the right upper quadrant  Musculoskeletal:        General: No deformity or edema. Normal range of motion.     Cervical back: Normal range of motion and neck supple.  Neurological: He is alert and oriented to person, place, and time.  Skin: Skin is warm. No rash noted. He is not diaphoretic. No erythema.  Psychiatric: His behavior is normal. Judgment normal.     Assessment/Plan Cholelithiasis with cholecystitis  I have reviewed the patient's laboratory and x-ray data.  I have discussed the diagnosis with the patient.  I discussed laparoscopic cholecystectomy and possible cholangiogram with the patient.  I discussed the reasoning for this.  We had a long discussion regarding the procedure and surgical risks.  He has received IV antibiotics.  Hopefully, surgery will be able to be performed today depending on the surgical schedule.  Currently, he understands and agrees to proceed with surgery  Coralie Keens, MD 01/10/2019, 6:38  AM

## 2019-01-10 NOTE — Plan of Care (Signed)
  Problem: Education: Goal: Knowledge of General Education information will improve Description: Including pain rating scale, medication(s)/side effects and non-pharmacologic comfort measures 01/10/2019 1536 by Buena Irish, RN Outcome: Adequate for Discharge 01/10/2019 1535 by Buena Irish, RN Outcome: Progressing   Problem: Health Behavior/Discharge Planning: Goal: Ability to manage health-related needs will improve 01/10/2019 1536 by Buena Irish, RN Outcome: Adequate for Discharge 01/10/2019 1535 by Buena Irish, RN Outcome: Progressing   Problem: Clinical Measurements: Goal: Ability to maintain clinical measurements within normal limits will improve 01/10/2019 1536 by Buena Irish, RN Outcome: Adequate for Discharge 01/10/2019 1535 by Buena Irish, RN Outcome: Progressing Goal: Will remain free from infection 01/10/2019 1536 by Buena Irish, RN Outcome: Adequate for Discharge 01/10/2019 1535 by Buena Irish, RN Outcome: Progressing Goal: Diagnostic test results will improve 01/10/2019 1536 by Buena Irish, RN Outcome: Adequate for Discharge 01/10/2019 1535 by Buena Irish, RN Outcome: Progressing Goal: Respiratory complications will improve 01/10/2019 1536 by Buena Irish, RN Outcome: Adequate for Discharge 01/10/2019 1535 by Buena Irish, RN Outcome: Progressing Goal: Cardiovascular complication will be avoided 01/10/2019 1536 by Buena Irish, RN Outcome: Adequate for Discharge 01/10/2019 1535 by Buena Irish, RN Outcome: Progressing   Problem: Activity: Goal: Risk for activity intolerance will decrease 01/10/2019 1536 by Buena Irish, RN Outcome: Adequate for Discharge 01/10/2019 1535 by Buena Irish, RN Outcome: Progressing   Problem: Nutrition: Goal: Adequate nutrition will be maintained 01/10/2019 1536 by Buena Irish, RN Outcome: Adequate for Discharge 01/10/2019 1535 by Buena Irish, RN Outcome: Progressing   Problem: Coping: Goal: Level of anxiety will decrease 01/10/2019 1536 by Buena Irish, RN Outcome: Adequate for Discharge 01/10/2019 1535 by Buena Irish, RN Outcome: Progressing   Problem: Elimination: Goal: Will not experience complications related to bowel motility 01/10/2019 1536 by Buena Irish, RN Outcome: Adequate for Discharge 01/10/2019 1535 by Buena Irish, RN Outcome: Progressing Goal: Will not experience complications related to urinary retention 01/10/2019 1536 by Buena Irish, RN Outcome: Adequate for Discharge 01/10/2019 1535 by Buena Irish, RN Outcome: Progressing   Problem: Pain Managment: Goal: General experience of comfort will improve 01/10/2019 1536 by Buena Irish, RN Outcome: Adequate for Discharge 01/10/2019 1535 by Buena Irish, RN Outcome: Progressing   Problem: Safety: Goal: Ability to remain free from injury will improve 01/10/2019 1536 by Buena Irish, RN Outcome: Adequate for Discharge 01/10/2019 1535 by Buena Irish, RN Outcome: Progressing   Problem: Skin Integrity: Goal: Risk for impaired skin integrity will decrease 01/10/2019 1536 by Buena Irish, RN Outcome: Adequate for Discharge 01/10/2019 1535 by Buena Irish, RN Outcome: Progressing

## 2019-01-10 NOTE — Progress Notes (Signed)
Surgical consents signed and placed on chart.  CHG performed, pt placed in gown.  Glasses left in room.  Pt alert and oriented in stable condition at this time.

## 2019-01-10 NOTE — Op Note (Signed)
Laparoscopic Cholecystectomy Procedure Note  Indications: This patient presents with symptomatic gallbladder disease and will undergo laparoscopic cholecystectomy.  Pre-operative Diagnosis: cholecystitis with cholelithiasis  Post-operative Diagnosis: Same  Surgeon: Coralie Keens   Assistants: 0  Anesthesia: General endotracheal anesthesia  ASA Class: 2  Procedure Details  The patient was seen again in the Holding Room. The risks, benefits, complications, treatment options, and expected outcomes were discussed with the patient. The possibilities of reaction to medication, pulmonary aspiration, perforation of viscus, bleeding, recurrent infection, finding a normal gallbladder, the need for additional procedures, failure to diagnose a condition, the possible need to convert to an open procedure, and creating a complication requiring transfusion or operation were discussed with the patient. The likelihood of improving the patient's symptoms with return to their baseline status is good.  The patient and/or family concurred with the proposed plan, giving informed consent. The site of surgery properly noted. The patient was taken to Operating Room, identified as Patrick [place] and the procedure verified as Laparoscopic Cholecystectomy with Intraoperative Cholangiogram. A Time Out was held and the above information confirmed.  Prior to the induction of general anesthesia, antibiotic prophylaxis was administered. General endotracheal anesthesia was then administered and tolerated well. After the induction, the abdomen was prepped with Chloraprep and draped in sterile fashion. The patient was positioned in the supine position.  Local anesthetic agent was injected into the skin near the umbilicus and an incision made. We dissected down to the abdominal fascia with blunt dissection.  The fascia was incised vertically and we entered the peritoneal cavity bluntly.  A pursestring suture of 0-Vicryl was  placed around the fascial opening.  The Hasson cannula was inserted and secured with the stay suture.  Pneumoperitoneum was then created with CO2 and tolerated well without any adverse changes in the patient's vital signs. A 5-mm port was placed in the subxiphoid position.  Two 5-mm ports were placed in the right upper quadrant. All skin incisions were infiltrated with a local anesthetic agent before making the incision and placing the trocars.   We positioned the patient in reverse Trendelenburg, tilted slightly to the patient's left.  The gallbladder was identified and was distended and acutely inflamed with edema The fundus grasped and retracted cephalad. Adhesions were lysed bluntly and with the electrocautery where indicated, taking care not to injure any adjacent organs or viscus. The infundibulum was grasped and retracted laterally, exposing the peritoneum overlying the triangle of Calot. This was then divided and exposed in a blunt fashion. The cystic duct was clearly identified and bluntly dissected circumferentially. A critical view of the cystic duct and cystic artery was obtained.  The cystic duct was then ligated with clips and divided. The cystic artery was, dissected free, ligated with clips and divided as well.  I also placed an endoloop around the cystic duct stump for closure.  The gallbladder was dissected from the liver bed in retrograde fashion with the electrocautery. The gallbladder was removed and placed in an Endocatch sac. The liver bed was irrigated and inspected. Hemostasis was achieved with the electrocautery. Copious irrigation was utilized and was repeatedly aspirated until clear.  The gallbladder and Endocatch sac were then removed through the umbilical port site.  The pursestring suture was used to close the umbilical fascia.    We again inspected the right upper quadrant for hemostasis.  Pneumoperitoneum was released as we removed the trocars.  4-0 Monocryl was used to close  the skin.  Skin glue was then applied. The  patient was then extubated and brought to the recovery room in stable condition. Instrument, sponge, and needle counts were correct at closure and at the conclusion of the case.   Findings: Cholecystitis with Cholelithiasis  Estimated Blood Loss: Minimal         Drains: 0         Specimens: Gallbladder           Complications: None; patient tolerated the procedure well.         Disposition: PACU - hemodynamically stable.         Condition: stable

## 2019-01-10 NOTE — Progress Notes (Signed)
Pt taken to PACU at this time.

## 2019-01-10 NOTE — Discharge Instructions (Signed)
[facility] ______[facility] °LAPAROSCOPIC SURGERY: POST OP INSTRUCTIONS °Always review your discharge instruction sheet given to you by the facility where your surgery was performed. °IF YOU HAVE DISABILITY OR FAMILY LEAVE FORMS, YOU MUST BRING THEM TO THE OFFICE FOR PROCESSING.   °DO NOT GIVE THEM TO YOUR DOCTOR. ° °1. A prescription for pain medication may be given to you upon discharge.  Take your pain medication as prescribed, if needed.  If narcotic pain medicine is not needed, then you may take acetaminophen (Tylenol) or ibuprofen (Advil) as needed. °2. Take your usually prescribed medications unless otherwise directed. °3. If you need a refill on your pain medication, please contact your pharmacy.  They will contact our office to request authorization. Prescriptions will not be filled after 5pm or on week-ends. °4. You should follow a light diet the first few days after arrival home, such as soup and crackers, etc.  Be sure to include lots of fluids daily. °5. Most patients will experience some swelling and bruising in the area of the incisions.  Ice packs will help.  Swelling and bruising can take several days to resolve.  °6. It is common to experience some constipation if taking pain medication after surgery.  Increasing fluid intake and taking a stool softener (such as Colace) will usually help or prevent this problem from occurring.  A mild laxative (Milk of Magnesia or Miralax) should be taken according to package instructions if there are no bowel movements after 48 hours. °7. Unless discharge instructions indicate otherwise, you may remove your bandages 24-48 hours after surgery, and you may shower at that time.  You may have steri-strips (small skin tapes) in place directly over the incision.  These strips should be left on the skin for 7-10 days.  If your surgeon used skin glue on the incision, you may shower in 24 hours.  The glue will flake off over the next 2-3 weeks.  Any sutures or  staples will be removed at the office during your follow-up visit. °8. ACTIVITIES:  You may resume regular (light) daily activities beginning the next day--such as daily self-care, walking, climbing stairs--gradually increasing activities as tolerated.  You may have sexual intercourse when it is comfortable.  Refrain from any heavy lifting or straining until approved by your doctor. °a. You may drive when you are no longer taking prescription pain medication, you can comfortably wear a seatbelt, and you can safely maneuver your car and apply brakes. °b. RETURN TO WORK:  __________________________________________________________ °9. You should see your doctor in the office for a follow-up appointment approximately 2-3 weeks after your surgery.  Make sure that you call for this appointment within a day or two after you arrive home to insure a convenient appointment time. °10. OTHER INSTRUCTIONS:OK TO SHOWER STARTING TOMORROW °11. ICE PACK, TYLENOL, IBUPROFEN ALSO FOR PAIN °12. NO LIFTING MORE THAN 15 TO 20 POUNDS FOR 2 WEEKS __________________________________________________________________________________________________________________________ __________________________________________________________________________________________________________________________ °WHEN TO CALL YOUR DOCTOR: °1. Fever over 101.0 °2. Inability to urinate °3. Continued bleeding from incision. °4. Increased pain, redness, or drainage from the incision. °5. Increasing abdominal pain ° °The clinic staff is available to answer your questions during regular business hours.  Please don’t hesitate to call and ask to speak to one of the nurses for clinical concerns.  If you have a medical emergency, go to the nearest emergency room or call 911.  A surgeon from [facility] is always on call at the hospital. °[address], [address], [place], [place]  [address] ? [address] [address], [place], [place]   [address] °[phone] ?  [phone] ? FAX [phone] °Web site: [email or web address] °

## 2019-01-10 NOTE — Progress Notes (Signed)
Discharge paperwork discussed with pt and wife at the bedside.  They demonstrated understanding.  IV removed, pt dressed.  Pt to be escorted to main lobby by wheelchair.

## 2019-01-11 ENCOUNTER — Encounter: Payer: Self-pay | Admitting: *Deleted

## 2019-01-11 NOTE — Anesthesia Postprocedure Evaluation (Signed)
Anesthesia Post Note  Patient: Quentine Ragin  Procedure(s) Performed: LAPAROSCOPIC CHOLECYSTECTOMY (N/A Abdomen)     Patient location during evaluation: PACU Anesthesia Type: General Level of consciousness: awake and alert Pain management: pain level controlled Vital Signs Assessment: post-procedure vital signs reviewed and stable Respiratory status: spontaneous breathing, nonlabored ventilation, respiratory function stable and patient connected to nasal cannula oxygen Cardiovascular status: blood pressure returned to baseline and stable Postop Assessment: no apparent nausea or vomiting Anesthetic complications: no    Last Vitals:  Vitals:   01/10/19 1253 01/10/19 1352  BP: 140/75 (!) 142/78  Pulse: 60 76  Resp: 16 16  Temp: 36.7 C 36.9 C  SpO2: 95% 95%    Last Pain:  Vitals:   01/10/19 1408  TempSrc:   PainSc: 3                  Zaidy Absher

## 2019-01-12 ENCOUNTER — Encounter: Payer: Self-pay | Admitting: Family Medicine

## 2019-01-12 LAB — SURGICAL PATHOLOGY

## 2019-08-30 ENCOUNTER — Telehealth: Payer: Self-pay | Admitting: Family Medicine

## 2019-08-30 ENCOUNTER — Other Ambulatory Visit: Payer: Self-pay

## 2019-08-30 ENCOUNTER — Encounter: Payer: Self-pay | Admitting: Family Medicine

## 2019-08-30 ENCOUNTER — Ambulatory Visit: Payer: Managed Care, Other (non HMO) | Admitting: Family Medicine

## 2019-08-30 ENCOUNTER — Ambulatory Visit (INDEPENDENT_AMBULATORY_CARE_PROVIDER_SITE_OTHER)
Admission: RE | Admit: 2019-08-30 | Discharge: 2019-08-30 | Disposition: A | Discharge status: Home / Self Care | Payer: Managed Care, Other (non HMO) | Source: Ambulatory Visit | Attending: Family Medicine | Admitting: Family Medicine

## 2019-08-30 VITALS — BP 140/80 | HR 84 | Temp 98.2°F | Ht 71.0 in | Wt 216.0 lb

## 2019-08-30 DIAGNOSIS — N200 Calculus of kidney: Secondary | ICD-10-CM | POA: Diagnosis not present

## 2019-08-30 DIAGNOSIS — R1012 Left upper quadrant pain: Secondary | ICD-10-CM | POA: Diagnosis not present

## 2019-08-30 DIAGNOSIS — N1330 Unspecified hydronephrosis: Secondary | ICD-10-CM | POA: Diagnosis not present

## 2019-08-30 DIAGNOSIS — R109 Unspecified abdominal pain: Secondary | ICD-10-CM | POA: Diagnosis not present

## 2019-08-30 LAB — POCT URINALYSIS DIPSTICK
Bilirubin, UA: NEGATIVE
Glucose, UA: NEGATIVE
Ketones, UA: NEGATIVE
Leukocytes, UA: NEGATIVE
Nitrite, UA: NEGATIVE
Protein, UA: POSITIVE — AB
Spec Grav, UA: >=1.03 — AB (ref 1.010–1.025)
Urobilinogen, UA: 0.2 E.U./dL
pH, UA: 5.5 (ref 5.0–8.0)

## 2019-08-30 NOTE — Patient Instructions (Signed)
Follow up for any fever or recurrent nausea or vomiting  We are setting up CT renal stone study.

## 2019-08-30 NOTE — Progress Notes (Addendum)
Established Patient Office Visit  Subjective:  Patient ID: Patrick Campbell, male    DOB: [birth year]  Age: 59 y.o. MRN: [id number]  CC:  Chief Complaint  Patient presents with  . Abdominal Pain    pt c/o Left lateral pain of abdomin. Sx started 2-3 days ago.     HPI Patrick Campbell presents for left flank pain which started this past Friday.  Did a workout 6:30 AM in the morning which did involve some abdominal exercises.  Around 9:30 he noticed pain.  He has had kidney stones the past but states this pain is different.  His pain is relatively constant.  This varies between 8 and 10 out of 10 intensity.  This is somewhat of a sharp pain.  No fever.  No burning with urination.  No nausea or vomiting.  Mild radiation toward the front.  He had colonoscopy 2013 which showed no diverticular changes.  Pain is better when he leans to the right.  He tried Tylenol and ibuprofen 600 mg without improvement.  No recent change in stools.  No constipation.  Past Medical History:  Diagnosis Date  . Acquired renal cyst of left kidney 16/6063   Complicated cyst vs solid mass: contrast enhanced CT was recommended but ?apparently not done?  Marland Kitchen Atypical chest pain 05/2016   w/u in ED neg.  Saw Dr. Orene Desanctis for ED f/u and ETT planned but MD like the pain was likely biliary colic.  Pt never got the ETT.  Marland Kitchen Chronic renal insufficiency, stage II (mild)    Borderline stage II/III (CrCl estimate @ low 60s)  . [id number] virus detected 10/2018   + covid test on 10/2 and again on 10/12, 2020.  Marland Kitchen History of gallstones    Lap chole 12/2018  . History of kidney stones   . Hyperlipidemia   . Hypertension   . Hypogonadism male 02/2010  . Left ureteral stone 2017   Saw urol for f/u 06/23/15 and plan was for him to return to them in 6 wks to get repeat u/s but he did not do this.  . Nephrolithiasis    left non-obstucive per ct 05-25-2015--69mm x 6 mm stone just beyond ureteropelvic jxn in prox L ureter, severe left  hydronephrosis noted with kidney edema.  Pt then saw Dr. Alyson Ingles with urol and got ureteroscopy with stent placement.  . Polycythemia 05/10/2010   EPO level normal-- IDIOPATHIC  . Rectal bleeding 04/2016   Saw GI--dx of outlet bleeding, reassured, constipation tx advised.  . Urgency of urination   . Urticaria    Allergy w/u by Dr. Verlin Fester    Past Surgical History:  Procedure Laterality Date  . CHOLECYSTECTOMY N/A 01/10/2019   Procedure: LAPAROSCOPIC CHOLECYSTECTOMY;  Surgeon: Coralie Keens, MD;  Location: [facility];  Service: General;  Laterality: N/A;  . COLONOSCOPY  09/30/11   Normal-repeat in 10 yrs  . CYSTOSCOPY WITH STENT PLACEMENT Left 05/29/2015   Procedure: CYSTOSCOPY WITH STENT PLACEMENT, RETROGRADE;  Surgeon: Cleon Gustin, MD;  Location: [facility];  Service: Urology;  Laterality: Left;  . CYSTOSCOPY/RETROGRADE/URETEROSCOPY/STONE EXTRACTION WITH BASKET Left 06/15/2015   Procedure: CYSTOSCOPY/RETROGRADE/URETEROSCOPY/STONE EXTRACTION WITH BASKET WITH STENT EXCHANGE;  Surgeon: Cleon Gustin, MD;  Location[facility];  Service: Urology;  Laterality: Left;  . HOLMIUM LASER APPLICATION Left 03/16/3555   Procedure: HOLMIUM LASER APPLICATION;  Surgeon: Cleon Gustin, MD;  Location: [facility];  Service: Urology;  Laterality: Left;  . KNEE SURGERY Right 2002  Family History  Problem Relation Age of Onset  . Hypertension Mother   . Food Allergy Mother   . Cancer Father        lung/ smoker  . Kidney disease Brother   . HIV Brother     Social History   Socioeconomic History  . Marital status: Married    Spouse name: Not on file  . Number of children: Not on file  . Years of education: Not on file  . Highest education level: Not on file  Occupational History  . Not on file  Tobacco Use  . Smoking status: Never Smoker  . Smokeless tobacco: Never Used  Vaping Use  . Vaping Use: Never used  Substance and Sexual Activity  . Alcohol  use: Yes    Alcohol/week: 3.0 - 4.0 standard drinks    Types: 3 - 4 Glasses of wine per week    Comment: 2 glasses of wine 2 days a week  . Drug use: No  . Sexual activity: Yes    Partners: Female  Other Topics Concern  . Not on file  Social History Narrative   Married, 3 children (77, 62, 21 yrs).  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   From [place].  Was in [employer] x 10 yrs in [place], [place].   Works out and plays sports regularly.  Rare wine intake.  No cigs or drugs.   Social Determinants of Health   Financial Resource Strain:   . Difficulty of Paying Living Expenses:   Food Insecurity:   . Worried About Charity fundraiser in the Last Year:   . Arboriculturist in the Last Year:   Transportation Needs:   . Film/video editor (Medical):   Marland Kitchen Lack of Transportation (Non-Medical):   Physical Activity:   . Days of Exercise per Week:   . Minutes of Exercise per Session:   Stress:   . Feeling of Stress :   Social Connections:   . Frequency of Communication with Friends and Family:   . Frequency of Social Gatherings with Friends and Family:   . Attends Religious Services:   . Active Member of Clubs or Organizations:   . Attends Archivist Meetings:   Marland Kitchen Marital Status:   Intimate Partner Violence:   . Fear of Current or Ex-Partner:   . Emotionally Abused:   Marland Kitchen Physically Abused:   . Sexually Abused:     Outpatient Medications Prior to Visit  Medication Sig Dispense Refill  . amLODipine (NORVASC) 10 MG tablet Take 1 tablet (10 mg total) by mouth daily. 90 tablet 2  . atorvastatin (LIPITOR) 40 MG tablet Take 40 mg by mouth daily. Take 1 tablet by mouth daily.    Marland Kitchen neomycin-polymyxin b-dexamethasone (MAXITROL) 3.5-10000-0.1 OINT Place 1 application into both eyes 2 (two) times daily.     Marland Kitchen oxyCODONE (OXY IR/ROXICODONE) 5 MG immediate release tablet Take 1 tablet (5 mg total) by mouth every 6 (six) hours as needed for moderate pain, severe pain or breakthrough pain.  25 tablet 0  . traZODone (DESYREL) 50 MG tablet Take 50 mg by mouth at bedtime as needed for sleep.   4   No facility-administered medications prior to visit.    No Known Allergies  ROS Review of Systems  Constitutional: Negative for chills and fever.  Respiratory: Negative for shortness of breath.   Cardiovascular: Negative for chest pain.  Gastrointestinal: Positive for abdominal pain. Negative for blood in stool, constipation, diarrhea,  nausea and vomiting.  Genitourinary: Positive for flank pain. Negative for dysuria and hematuria.      Objective:    Physical Exam Vitals reviewed.  Constitutional:      Appearance: He is well-developed.  Cardiovascular:     Rate and Rhythm: Normal rate and regular rhythm.  Pulmonary:     Effort: Pulmonary effort is normal.     Breath sounds: Normal breath sounds.  Abdominal:     General: Bowel sounds are normal. There is no distension.     Palpations: Abdomen is soft.     Tenderness: There is no abdominal tenderness.  Neurological:     Mental Status: He is alert.     BP 140/80   Pulse 84   Temp 98.2 F (36.8 C) (Other (Comment))   Ht 5\' 11"  (1.803 m)   Wt 216 lb (98 kg)   SpO2 95%   BMI 30.13 kg/m  Wt Readings from Last 3 Encounters:  08/30/19 216 lb (98 kg)  01/09/19 210 lb (95.3 kg)  11/05/18 213 lb (96.6 kg)     Health Maintenance Due  Topic Date Due  . Hepatitis C Screening  Never done  . COVID-19 Vaccine (1) Never done  . TETANUS/TDAP  Never done  . INFLUENZA VACCINE  08/15/2019    There are no preventive care reminders to display for this patient.  Lab Results  Component Value Date   TSH 1.43 09/01/2017   Lab Results  Component Value Date   WBC 10.8 (H) 01/09/2019   HGB 16.8 01/09/2019   HCT 52.4 (H) 01/09/2019   MCV 91.8 01/09/2019   PLT 229 01/09/2019   Lab Results  Component Value Date   NA 138 01/09/2019   K 3.7 01/09/2019   CO2 22 01/09/2019   GLUCOSE 120 (H) 01/09/2019   BUN 17  01/09/2019   CREATININE 1.28 (H) 01/09/2019   BILITOT 0.3 01/09/2019   ALKPHOS 57 01/09/2019   AST 24 01/09/2019   ALT 39 01/09/2019   PROT 7.4 01/09/2019   ALBUMIN 4.3 01/09/2019   CALCIUM 9.5 01/09/2019   ANIONGAP 10 01/09/2019   GFR 56.97 (L) 05/25/2015   Lab Results  Component Value Date   CHOL 227 (A) 09/01/2017   Lab Results  Component Value Date   HDL 50 09/01/2017   Lab Results  Component Value Date   LDLCALC 157 09/01/2017   Lab Results  Component Value Date   TRIG 100 09/01/2017   Lab Results  Component Value Date   CHOLHDL 4 07/22/2011   Lab Results  Component Value Date   HGBA1C 5.5 09/01/2017      Assessment & Plan:   Problem List Items Addressed This Visit      Unprioritized   Left flank pain - Primary   Relevant Orders   POCT urinalysis dipstick    Urine dipstick does show 2+ blood but no leukocytes or nitrites.  We will set up CT renal stone study to evaluate especially given his history as above.  We offered additional pain medication but he declines.  Has some leftover oxycodone at home which he hesitates to take because he does not feel well overall with that.  He will try to continue with ibuprofen in the meantime  No orders of the defined types were placed in this encounter.  Addendum: CT renal stone study shows left hydronephrosis with 4 mm left proximal ureter stone.  We will set up referral back to urology  Follow-up: No follow-ups on  file.    Carolann Littler, MD

## 2019-08-30 NOTE — Telephone Encounter (Signed)
Pt said he thinks this office called him about a CT scan. Pt returned call.  Pt # [id number]  Please advise

## 2019-08-30 NOTE — Addendum Note (Signed)
Addended by: Eulas Post on: 08/30/2019 05:53 PM   Modules accepted: Orders

## 2019-08-31 ENCOUNTER — Telehealth: Payer: Self-pay | Admitting: Family Medicine

## 2019-08-31 NOTE — Telephone Encounter (Signed)
Please see if Hilda Blades can call [facility] to get him in.  I did place the order as "urgent".

## 2019-08-31 NOTE — Telephone Encounter (Signed)
Dr. Elease Hashimoto spoke with patient yesterday afternoon about his results.

## 2019-08-31 NOTE — Telephone Encounter (Signed)
Spoke with [place]. Hilda Blades spoke with [facility] and they are going to get in contact with him and have him talk with their Triage nurse

## 2019-08-31 NOTE — Telephone Encounter (Signed)
Pt saw Dr. Elease Hashimoto yesterday and was told that a stone was stuck in his urinary track. Pt stated last night was the first night he was drench in sweat to the point where he had to change his clothes in the middle of the night and then when he woke up again this morning he was drench in sweat again. Pt is concerned and does not want to get to a place where it is reconcilable. He feel this is very urgent that he sees somebody today and make a decision on what to do.    Pt can be reached at [phone]

## 2019-08-31 NOTE — Telephone Encounter (Signed)
Please advise. Patient has not heard from urology yet.

## 2019-08-31 NOTE — Telephone Encounter (Signed)
Thanks

## 2019-09-25 ENCOUNTER — Encounter: Payer: Self-pay | Admitting: Family Medicine

## 2019-10-04 ENCOUNTER — Encounter: Payer: Self-pay | Admitting: Family Medicine

## 2020-02-15 DIAGNOSIS — N503 Cyst of epididymis: Secondary | ICD-10-CM

## 2020-02-15 HISTORY — DX: Cyst of epididymis: N50.3

## 2020-02-17 ENCOUNTER — Encounter: Payer: Self-pay | Admitting: Family Medicine

## 2020-02-17 ENCOUNTER — Ambulatory Visit: Payer: Managed Care, Other (non HMO) | Admitting: Family Medicine

## 2020-02-17 ENCOUNTER — Ambulatory Visit (INDEPENDENT_AMBULATORY_CARE_PROVIDER_SITE_OTHER): Payer: Managed Care, Other (non HMO)

## 2020-02-17 ENCOUNTER — Other Ambulatory Visit: Payer: Self-pay

## 2020-02-17 VITALS — BP 140/80 | HR 69 | Temp 98.5°F | Resp 18 | Ht 71.0 in | Wt 220.0 lb

## 2020-02-17 DIAGNOSIS — N433 Hydrocele, unspecified: Secondary | ICD-10-CM | POA: Diagnosis not present

## 2020-02-17 DIAGNOSIS — N442 Benign cyst of testis: Secondary | ICD-10-CM

## 2020-02-17 NOTE — Progress Notes (Addendum)
[place] at [facility] [address] [place],  [address] [phone]  Date:  02/17/2020   Name:  Patrick Campbell   DOB:  1960/08/20   MRN:  [id number]  PCP:  Tammi Sou, MD    Chief Complaint: Testicle Pain (Left, Pt states noticed a knot last night. Pt states some pain, no redness or swelling. )   History of Present Illness:  Patrick Campbell is a 60 y.o. very pleasant male patient who presents with the following:  Pt of Dr Ernestine Conrad who I have not seen in the past.  Here today with concern of testicular swelling He has history of kidney stones and HTN He noted a hard spot on his left testicle last night when he was in the shower It is a bit tender as he has checked it several times- however he thinks this is just because he has pressed on it No urinary sx He is otherwise well today No fever or chills  No penile discharge  His urologist is in Dr Diona Fanti   Patient Active Problem List   Diagnosis Date Noted  . Cholelithiasis with cholecystitis 01/09/2019  . Urticaria 12/02/2017  . Angioedema 12/02/2017  . Seasonal allergic rhinitis 12/02/2017  . Hypernatremia 09/04/2017  . Rectal bleeding 04/30/2016  . Hydronephrosis of left kidney 05/26/2015  . Kidney stone 05/26/2015  . Ureteral stone 05/26/2015  . Left flank pain 05/25/2015  . Other malaise and fatigue 08/07/2012  . Health maintenance examination 07/15/2011  . Chronic headaches 06/12/2010  . Blurry vision 06/12/2010  . Chronic renal insufficiency 06/12/2010  . Polycythemia 05/10/2010  . Hyperlipidemia 05/09/2010  . Erectile dysfunction 05/09/2010  . HTN (hypertension), benign 05/09/2010  . Hypogonadism male     Past Medical History:  Diagnosis Date  . Acquired renal cyst of left kidney 22/2979   Complicated cyst vs solid mass: contrast enhanced CT was recommended but ?apparently not done?  Marland Kitchen Atypical chest pain 05/2016   w/u in ED neg.  Saw Dr.  Orene Desanctis for ED f/u and ETT planned but MD like the pain was likely biliary colic.  Pt never got the ETT.  Marland Kitchen Chronic renal insufficiency, stage II (mild)    Borderline stage II/III (CrCl estimate @ low 60s)  . [id number] virus detected 10/2018   + covid test on 10/2 and again on 10/12, 2020.  Marland Kitchen History of gallstones    Lap chole 12/2018  . History of kidney stones    ongoing L nephrolithiasis as of 08/2019 urol f/u  . Hyperlipidemia   . Hypertension   . Hypogonadism male 02/2010  . Left ureteral stone 2017   2017->stent.  ureteroscopic laser litho, left-2017. ? more fragments 08/2019?  Marland Kitchen Nephrolithiasis    left non-obstucive per ct 05-25-2015--53mm x 6 mm stone just beyond ureteropelvic jxn in prox L ureter, severe left hydronephrosis noted with kidney edema.  Pt then saw Dr. Alyson Ingles with urol and got ureteroscopy with stent placement.  . Polycythemia 05/10/2010   EPO level normal-- IDIOPATHIC  . Rectal bleeding 04/2016   Saw GI--dx of outlet bleeding, reassured, constipation tx advised.  . Urgency of urination   . Urticaria    Allergy w/u by Dr. Verlin Fester    Past Surgical History:  Procedure Laterality Date  . CHOLECYSTECTOMY N/A 01/10/2019   Procedure: LAPAROSCOPIC CHOLECYSTECTOMY;  Surgeon: Coralie Keens, MD;  Location: [facility];  Service: General;  Laterality: N/A;  . COLONOSCOPY  09/30/11  Normal-repeat in 10 yrs  . CYSTOSCOPY WITH STENT PLACEMENT Left 05/29/2015   Procedure: CYSTOSCOPY WITH STENT PLACEMENT, RETROGRADE;  Surgeon: Cleon Gustin, MD;  Location: [facility];  Service: Urology;  Laterality: Left;  . CYSTOSCOPY/RETROGRADE/URETEROSCOPY/STONE EXTRACTION WITH BASKET Left 06/15/2015   Procedure: CYSTOSCOPY/RETROGRADE/URETEROSCOPY/STONE EXTRACTION WITH BASKET WITH STENT EXCHANGE;  Surgeon: Cleon Gustin, MD;  Location: [facility];  Service: Urology;  Laterality: Left;  . HOLMIUM LASER APPLICATION Left 2/0/2542   Procedure: HOLMIUM LASER APPLICATION;   Surgeon: Cleon Gustin, MD;  Location: [facility];  Service: Urology;  Laterality: Left;  . KNEE SURGERY Right 2002    Social History   Tobacco Use  . Smoking status: Never Smoker  . Smokeless tobacco: Never Used  Vaping Use  . Vaping Use: Never used  Substance Use Topics  . Alcohol use: Yes    Alcohol/week: 3.0 - 4.0 standard drinks    Types: 3 - 4 Glasses of wine per week    Comment: 2 glasses of wine 2 days a week  . Drug use: No    Family History  Problem Relation Age of Onset  . Hypertension Mother   . Food Allergy Mother   . Cancer Father        lung/ smoker  . Kidney disease Brother   . HIV Brother     No Known Allergies  Medication list has been reviewed and updated.  Current Outpatient Medications on File Prior to Visit  Medication Sig Dispense Refill  . amLODipine (NORVASC) 10 MG tablet Take 1 tablet (10 mg total) by mouth daily. 90 tablet 2  . atorvastatin (LIPITOR) 40 MG tablet Take 40 mg by mouth daily. Take 1 tablet by mouth daily.    Marland Kitchen neomycin-polymyxin b-dexamethasone (MAXITROL) 3.5-10000-0.1 OINT Place 1 application into both eyes 2 (two) times daily.  (Patient not taking: Reported on 02/17/2020)    . oxyCODONE (OXY IR/ROXICODONE) 5 MG immediate release tablet Take 1 tablet (5 mg total) by mouth every 6 (six) hours as needed for moderate pain, severe pain or breakthrough pain. (Patient not taking: Reported on 02/17/2020) 25 tablet 0  . traZODone (DESYREL) 50 MG tablet Take 50 mg by mouth at bedtime as needed for sleep.  (Patient not taking: Reported on 02/17/2020)  4   No current facility-administered medications on file prior to visit.    Review of Systems:  As per HPI- otherwise negative.   Physical Examination: Vitals:   02/17/20 1106  BP: 140/80  Pulse: 69  Resp: 18  Temp: 98.5 F (36.9 C)  SpO2: 96%   Vitals:   02/17/20 1106  Weight: 220 lb (99.8 kg)  Height: 5\' 11"  (1.803 m)   Body mass index is 30.68 kg/m. Ideal  Body Weight: Weight in (lb) to have BMI = 25: 178.9  GEN: no acute distress. Overweight, looks well  HEENT: Atraumatic, Normocephalic.  Ears and Nose: No external deformity. CV: RRR, No M/G/R. No JVD. No thrill. No extra heart sounds. PULM: CTA B, no wheezes, crackles, rhonchi. No retractions. No resp. distress. No accessory muscle use. ABD: S, NT, ND, +BS. No rebound. No HSM. EXTR: No c/c/e PSYCH: Normally interactive. Conversant.  There is a firm mass posterior to the left testicle- may be a cyst  Otherwise genital exam is normall  Assessment and Plan: Testicular cyst - Plan: US Scrotum  Pt here today with concern of a bump noted on genital exam This may be a cyst Arrange ultrasound asap  as pt is concerned- scheduled for later today Will plan further follow- up pending [place] report   Signed Lamar Blinks, MD  Received his ultrasound about 4:30 PM, called patient and gave report  US SCROTUM W/DOPPLER  Result Date: 02/17/2020 CLINICAL DATA:  Mass posterior to the left testicle. EXAM: SCROTAL ULTRASOUND DOPPLER ULTRASOUND OF THE TESTICLES TECHNIQUE: Complete ultrasound examination of the testicles, epididymis, and other scrotal structures was performed. Color and spectral Doppler ultrasound were also utilized to evaluate blood flow to the testicles. COMPARISON:  None. FINDINGS: Right testicle Measurements: 3.8 x 2.7 x 2.8 cm. No mass or microlithiasis visualized. Left testicle Measurements: 4 x 3 x 2.6 cm. No mass or microlithiasis visualized. Patient's palpable area of concern appears to correspond to the left epididymal tail. Right epididymis: There is a small 7 mm right-sided epididymal head cyst. Left epididymis:  Normal in size and appearance. Hydrocele: There are moderate-sized bilateral hydroceles, left greater than right. Varicocele:  None visualized. Pulsed Doppler interrogation of both testes demonstrates normal low resistance arterial and venous waveforms bilaterally. IMPRESSION:  1. No testicular mass.  No testicular torsion. 2. The patient's palpable area of concern appears to correspond to the epididymal tail on the left. 3. Moderate bilateral hydroceles are noted, left greater than right. Electronically Signed   By: Constance Holster M.D.   On: 02/17/2020 15:01   The area of concern appears to be the epididymis, but certainly has patient has noticed a change I would recommend that he follow-up nonurgently with his urologist.  He plans to call [facility] to make an appointment.  He will let me know if any problems or concerns in the meantime

## 2020-02-17 NOTE — Patient Instructions (Signed)
Good to see you today!  Please go to the [facility] for your ultrasound at 2pm  Address: [address] [place], [place] [address]  I will be in touch with your report asap

## 2020-02-24 ENCOUNTER — Other Ambulatory Visit: Payer: Self-pay | Admitting: Urology

## 2020-02-29 ENCOUNTER — Encounter: Payer: Self-pay | Admitting: Family Medicine

## 2020-04-01 DIAGNOSIS — T1490XA Injury, unspecified, initial encounter: Secondary | ICD-10-CM

## 2020-04-01 HISTORY — DX: Injury, unspecified, initial encounter: T14.90XA

## 2020-04-03 ENCOUNTER — Encounter: Payer: Self-pay | Admitting: Family Medicine

## 2020-04-03 ENCOUNTER — Other Ambulatory Visit: Payer: Self-pay

## 2020-04-03 ENCOUNTER — Ambulatory Visit (INDEPENDENT_AMBULATORY_CARE_PROVIDER_SITE_OTHER): Payer: Managed Care, Other (non HMO) | Admitting: Family Medicine

## 2020-04-03 VITALS — BP 122/78 | HR 77 | Temp 97.6°F | Resp 16 | Ht 71.0 in | Wt 223.4 lb

## 2020-04-03 DIAGNOSIS — I10 Essential (primary) hypertension: Secondary | ICD-10-CM | POA: Diagnosis not present

## 2020-04-03 DIAGNOSIS — Z0000 Encounter for general adult medical examination without abnormal findings: Secondary | ICD-10-CM

## 2020-04-03 DIAGNOSIS — Z125 Encounter for screening for malignant neoplasm of prostate: Secondary | ICD-10-CM

## 2020-04-03 DIAGNOSIS — E7800 Pure hypercholesterolemia, unspecified: Secondary | ICD-10-CM | POA: Diagnosis not present

## 2020-04-03 NOTE — Patient Instructions (Signed)

## 2020-04-03 NOTE — Progress Notes (Signed)
Office Note 04/03/2020  CC:  Chief Complaint  Patient presents with  . Annual Exam    Pt is not fasting    HPI:  Patrick Campbell is a 60 y.o. Black male who is here for annual health maintenance exam. It has been about 9 yrs since he's had a routine physical with me.  He has had inconsistent routine f/u for his bp/lipids. He is currently getting set up to get epididymal mass (cyst suspected) excised by urologist on 04/10/20.  Feeling well overall, pretty good exercise and dietary habits. Takes atorva 40mg  qd and amlod 10mg  qd faithfully.  Has remote hx of some "slight chest pain" that he finds hard to describe, not clearly exercise induced, lasted "not very long".  Hard to get details but pt mainly worried about his risk of CAD and if any testing available to help further determine the health of his heart. Some of his friends have had MI's and he's worried.   Past Medical History:  Diagnosis Date  . Acquired renal cyst of left kidney 50/3546   Complicated cyst vs solid mass: contrast enhanced CT was recommended but ?apparently not done?  Marland Kitchen Atypical chest pain 05/2016   w/u in ED neg.  Saw Dr. Orene Desanctis for ED f/u and ETT planned but MD like the pain was likely biliary colic.  Pt never got the ETT.  Marland Kitchen Chronic renal insufficiency, stage II (mild)    Borderline stage II/III (CrCl estimate @ low 60s)  . FKCLE-75 virus detected 10/2018   + covid test on 10/2 and again on 10/12, 2020.  Marland Kitchen Epididymal cyst 02/2020   LEFT->pt desires excision so Dr. Diona Fanti will do  . History of gallstones    Lap chole 12/2018  . History of kidney stones    ongoing L nephrolithiasis as of 08/2019 urol f/u  . Hyperlipidemia   . Hypertension   . Left ureteral stone 2017; 08/2019   2017->stent.  ureteroscopic laser litho, left-2017. ? more fragments 08/2019?  Marland Kitchen Polycythemia 05/10/2010   EPO level normal-- IDIOPATHIC  . Rectal bleeding 04/2016   Saw GI--dx of outlet bleeding, reassured, constipation tx  advised.  . 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 Urticaria    Allergy w/u by Dr. Verlin Fester    Past Surgical History:  Procedure Laterality Date  . CHOLECYSTECTOMY N/A 01/10/2019   Procedure: LAPAROSCOPIC CHOLECYSTECTOMY;  Surgeon: Coralie Keens, MD;  Location: [facility];  Service: General;  Laterality: N/A;  . COLONOSCOPY  09/30/11   Normal-repeat in 10 yrs  . CYSTOSCOPY WITH STENT PLACEMENT Left 05/29/2015   Procedure: CYSTOSCOPY WITH STENT PLACEMENT, RETROGRADE;  Surgeon: Cleon Gustin, MD;  Location: [facility];  Service: Urology;  Laterality: Left;  . CYSTOSCOPY/RETROGRADE/URETEROSCOPY/STONE EXTRACTION WITH BASKET Left 06/15/2015   Procedure: CYSTOSCOPY/RETROGRADE/URETEROSCOPY/STONE EXTRACTION WITH BASKET WITH STENT EXCHANGE;  Surgeon: Cleon Gustin, MD;  Location: [facility];  Service: Urology;  Laterality: Left;  . HOLMIUM LASER APPLICATION Left 01/21/15   Procedure: HOLMIUM LASER APPLICATION;  Surgeon: Cleon Gustin, MD;  Location: [facility];  Service: Urology;  Laterality: Left;  . KNEE SURGERY Right 2002    Family History  Problem Relation Age of Onset  . Hypertension Mother   . Food Allergy Mother   . Cancer Father        lung/ smoker  . Kidney disease Brother   . HIV Brother     Social History   Socioeconomic History  .  Marital status: Married    Spouse name: Not on file  . Number of children: Not on file  . Years of education: Not on file  . Highest education level: Not on file  Occupational History  . Not on file  Tobacco Use  . Smoking status: Never Smoker  . Smokeless tobacco: Never Used  Vaping Use  . Vaping Use: Never used  Substance and Sexual Activity  . Alcohol use: Yes    Alcohol/week: 3.0 - 4.0 standard drinks    Types: 3 - 4 Glasses of wine per week    Comment: 2 glasses of wine 2 days a week  . Drug use: No  . Sexual activity: Yes    Partners: Female  Other  Topics Concern  . Not on file  Social History Narrative   Married, 3 children (33, 49, 21 yrs).  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   From [place].  Was in [employer] x 10 yrs in [place], [place].   Works out and plays sports regularly.  Rare wine intake.  No cigs or drugs.   Social Determinants of Health   Financial Resource Strain: Not on file  Food Insecurity: Not on file  Transportation Needs: Not on file  Physical Activity: Not on file  Stress: Not on file  Social Connections: Not on file  Intimate Partner Violence: Not on file    Outpatient Medications Prior to Visit  Medication Sig Dispense Refill  . amLODipine (NORVASC) 10 MG tablet Take 1 tablet (10 mg total) by mouth daily. 90 tablet 2  . atorvastatin (LIPITOR) 40 MG tablet Take 40 mg by mouth daily. Take 1 tablet by mouth daily.    . diclofenac (CATAFLAM) 50 MG tablet Take 50 mg by mouth 3 (three) times daily as needed.    Marland Kitchen oxyCODONE (OXY IR/ROXICODONE) 5 MG immediate release tablet Take 1 tablet (5 mg total) by mouth every 6 (six) hours as needed for moderate pain, severe pain or breakthrough pain. (Patient not taking: No sig reported) 25 tablet 0  . traZODone (DESYREL) 50 MG tablet Take 50 mg by mouth at bedtime as needed for sleep.  (Patient not taking: No sig reported)  4  . neomycin-polymyxin b-dexamethasone (MAXITROL) 3.5-10000-0.1 OINT Place 1 application into both eyes 2 (two) times daily.  (Patient not taking: No sig reported)     No facility-administered medications prior to visit.    No Known Allergies  ROS Review of Systems  Constitutional: Negative for appetite change, chills, fatigue and fever.  HENT: Negative for congestion, dental problem, ear pain and sore throat.   Eyes: Negative for discharge, redness and visual disturbance.  Respiratory: Negative for cough, chest tightness, shortness of breath and wheezing.   Cardiovascular: Negative for chest pain, palpitations and leg swelling.  Gastrointestinal:  Negative for abdominal pain, blood in stool, diarrhea, nausea and vomiting.  Genitourinary: Negative for difficulty urinating, dysuria, flank pain, frequency, hematuria and urgency.  Musculoskeletal: Negative for arthralgias, back pain, joint swelling, myalgias and neck stiffness.  Skin: Negative for pallor and rash.  Neurological: Negative for dizziness, speech difficulty, weakness and headaches.  Hematological: Negative for adenopathy. Does not bruise/bleed easily.  Psychiatric/Behavioral: Negative for confusion and sleep disturbance. The patient is not nervous/anxious.    PE; Vitals with BMI 04/03/2020 02/17/2020 08/30/2019  Height 5\' 11"  5\' 11"  5\' 11"   Weight 223 lbs 6 oz 220 lbs 216 lbs  BMI 31.17 63.0 16.01  Systolic 093 235 573  Diastolic 78 80 80  Pulse 77 69 84   Gen: Alert, well appearing.  Patient is oriented to person, place, time, and situation. AFFECT: pleasant, lucid thought and speech. ENT: Ears: EACs clear, normal epithelium.  TMs with good light reflex and landmarks bilaterally.  Eyes: no injection, icteris, swelling, or exudate.  EOMI, PERRLA. Nose: no drainage or turbinate edema/swelling.  No injection or focal lesion.  Mouth: lips without lesion/swelling.  Oral mucosa pink and moist.  Dentition intact and without obvious caries or gingival swelling.  Oropharynx without erythema, exudate, or swelling.  Neck: supple/nontender.  No LAD, mass, or TM.  Carotid pulses 2+ bilaterally, without bruits. CV: RRR, no m/r/g.   LUNGS: CTA bilat, nonlabored resps, good aeration in all lung fields. ABD: soft, NT, ND, BS normal.  No hepatospenomegaly or mass.  No bruits. EXT: no clubbing, cyanosis, or edema.  Musculoskeletal: no joint swelling, erythema, warmth, or tenderness.  ROM of all joints intact. Skin - no sores or suspicious lesions or rashes or color changes   Pertinent labs:  Lab Results  Component Value Date   TSH 1.43 09/01/2017   Lab Results  Component Value Date    WBC 10.8 (H) 01/09/2019   HGB 16.8 01/09/2019   HCT 52.4 (H) 01/09/2019   MCV 91.8 01/09/2019   PLT 229 01/09/2019   Lab Results  Component Value Date   CREATININE 1.28 (H) 01/09/2019   BUN 17 01/09/2019   NA 138 01/09/2019   K 3.7 01/09/2019   CL 106 01/09/2019   CO2 22 01/09/2019   Lab Results  Component Value Date   ALT 39 01/09/2019   AST 24 01/09/2019   ALKPHOS 57 01/09/2019   BILITOT 0.3 01/09/2019   Lab Results  Component Value Date   CHOL 227 (A) 09/01/2017   Lab Results  Component Value Date   HDL 50 09/01/2017   Lab Results  Component Value Date   LDLCALC 157 09/01/2017   Lab Results  Component Value Date   TRIG 100 09/01/2017   Lab Results  Component Value Date   CHOLHDL 4 07/22/2011   Lab Results  Component Value Date   PSA 2.2 09/01/2017   PSA 0.75 07/22/2011   Lab Results  Component Value Date   HGBA1C 5.5 09/01/2017   12 lead EKG from 01/09/19: NSR, rate 61, no ischemic changes.  Normal intervals and duration.  No hypertrophy.  No ectopy.  ASSESSMENT AND PLAN:   1) HTN: stable, cont amlod 10mg  qd. Lytes/cr future.  2) HLD: tolerating atorva 40mg  qd. FLP and hepatic panel future.  3) CAD "screening": pt desires further risk stratification -->ordered coronary calcium score.  4) Health maintenance exam: Reviewed age and gender appropriate health maintenance issues (prudent diet, regular exercise, health risks of tobacco and excessive alcohol, use of seatbelts, fire alarms in home, use of sunscreen).  Also reviewed age and gender appropriate health screening as well as vaccine recommendations. Vaccines: Tdap due->pt declined.  Shingrix->pt declined.   Labs: HP labs + PSA ordered-->lab appt when fasting. Prostate ca screening: PSA ordered. Colon ca screening: normal colonoscopy 2013-->recall 09/2021.  An After Visit Summary was printed and given to the patient.  FOLLOW UP:  No follow-ups on file.  Signed:  Crissie Sickles, MD            04/03/2020

## 2020-04-05 ENCOUNTER — Encounter (HOSPITAL_BASED_OUTPATIENT_CLINIC_OR_DEPARTMENT_OTHER): Payer: Self-pay | Admitting: Urology

## 2020-04-05 ENCOUNTER — Ambulatory Visit (INDEPENDENT_AMBULATORY_CARE_PROVIDER_SITE_OTHER): Payer: Managed Care, Other (non HMO)

## 2020-04-05 ENCOUNTER — Other Ambulatory Visit: Payer: Self-pay

## 2020-04-05 DIAGNOSIS — I10 Essential (primary) hypertension: Secondary | ICD-10-CM

## 2020-04-05 DIAGNOSIS — E7800 Pure hypercholesterolemia, unspecified: Secondary | ICD-10-CM

## 2020-04-05 DIAGNOSIS — Z125 Encounter for screening for malignant neoplasm of prostate: Secondary | ICD-10-CM

## 2020-04-05 LAB — CBC WITH DIFFERENTIAL/PLATELET
Basophils Absolute: 0.1 10*3/uL (ref 0.0–0.1)
Basophils Relative: 1.3 % (ref 0.0–3.0)
Eosinophils Absolute: 0.3 10*3/uL (ref 0.0–0.7)
Eosinophils Relative: 5.5 % — ABNORMAL HIGH (ref 0.0–5.0)
HCT: 46.5 % (ref 39.0–52.0)
Hemoglobin: 15.4 g/dL (ref 13.0–17.0)
Lymphocytes Relative: 27.3 % (ref 12.0–46.0)
Lymphs Abs: 1.6 10*3/uL (ref 0.7–4.0)
MCHC: 33.2 g/dL (ref 30.0–36.0)
MCV: 90.7 fl (ref 78.0–100.0)
Monocytes Absolute: 0.5 10*3/uL (ref 0.1–1.0)
Monocytes Relative: 8.9 % (ref 3.0–12.0)
Neutro Abs: 3.2 10*3/uL (ref 1.4–7.7)
Neutrophils Relative %: 57 % (ref 43.0–77.0)
Platelets: 193 10*3/uL (ref 150.0–400.0)
RBC: 5.13 Mil/uL (ref 4.22–5.81)
RDW: 13.9 % (ref 11.5–15.5)
WBC: 5.7 10*3/uL (ref 4.0–10.5)

## 2020-04-05 LAB — TSH: TSH: 1.93 u[IU]/mL (ref 0.35–4.50)

## 2020-04-05 LAB — LIPID PANEL
Cholesterol: 175 mg/dL (ref 0–200)
HDL: 50.5 mg/dL (ref >39.00)
LDL Cholesterol: 105 mg/dL — ABNORMAL HIGH (ref 0–99)
NonHDL: 124.05
Total CHOL/HDL Ratio: 3
Triglycerides: 97 mg/dL (ref 0.0–149.0)
VLDL: 19.4 mg/dL (ref 0.0–40.0)

## 2020-04-05 LAB — COMPREHENSIVE METABOLIC PANEL
ALT: 43 U/L (ref 0–53)
AST: 22 U/L (ref 0–37)
Albumin: 4.6 g/dL (ref 3.5–5.2)
Alkaline Phosphatase: 66 U/L (ref 39–117)
BUN: 18 mg/dL (ref 6–23)
CO2: 26 mEq/L (ref 19–32)
Calcium: 9.3 mg/dL (ref 8.4–10.5)
Chloride: 105 mEq/L (ref 96–112)
Creatinine, Ser: 1.09 mg/dL (ref 0.40–1.50)
GFR: 74.58 mL/min (ref >60.00)
Glucose, Bld: 81 mg/dL (ref 70–99)
Potassium: 4.6 mEq/L (ref 3.5–5.1)
Sodium: 139 mEq/L (ref 135–145)
Total Bilirubin: 0.5 mg/dL (ref 0.2–1.2)
Total Protein: 6.9 g/dL (ref 6.0–8.3)

## 2020-04-05 LAB — PSA: PSA: 2.38 ng/mL (ref 0.10–4.00)

## 2020-04-05 NOTE — Progress Notes (Signed)
Spoke w/ via phone for pre-op interview---pt Lab needs dos----  I stat,  ekg             Lab results------none COVID test ------04-06-2020 1230 Arrive at -------730 am 04-10-2020 NPO after MN NO Solid Food.  Clear liquids from MN until---630 am then npo Med rec completed Medications to take morning of surgery -----amlodipine, atorvastatin Diabetic medication -----n/a Patient instructed to bring photo id and insurance card day of surgery Patient aware to have Driver (ride ) / caregiver  danyelle Coats will stay   for 24 hours after surgery  Patient Special Instructions -----none Pre-Op special Istructions -----none Patient verbalized understanding of instructions that were given at this phone interview. Patient denies shortness of breath, chest pain, fever, cough at this phone interview.

## 2020-04-06 ENCOUNTER — Other Ambulatory Visit (HOSPITAL_COMMUNITY)
Admission: RE | Admit: 2020-04-06 | Discharge: 2020-04-06 | Disposition: A | Discharge status: Home / Self Care | Payer: Managed Care, Other (non HMO) | Source: Ambulatory Visit | Attending: Urology | Admitting: Urology

## 2020-04-06 DIAGNOSIS — Z01812 Encounter for preprocedural laboratory examination: Secondary | ICD-10-CM | POA: Insufficient documentation

## 2020-04-06 DIAGNOSIS — Z20822 Contact with and (suspected) exposure to covid-19: Secondary | ICD-10-CM | POA: Insufficient documentation

## 2020-04-06 LAB — SARS CORONAVIRUS 2 (TAT 6-24 HRS): SARS Coronavirus 2: NEGATIVE

## 2020-04-10 ENCOUNTER — Ambulatory Visit (HOSPITAL_BASED_OUTPATIENT_CLINIC_OR_DEPARTMENT_OTHER): Payer: Managed Care, Other (non HMO) | Admitting: Anesthesiology

## 2020-04-10 ENCOUNTER — Encounter (HOSPITAL_BASED_OUTPATIENT_CLINIC_OR_DEPARTMENT_OTHER): Payer: Self-pay | Admitting: Urology

## 2020-04-10 ENCOUNTER — Encounter (HOSPITAL_BASED_OUTPATIENT_CLINIC_OR_DEPARTMENT_OTHER)
Admission: RE | Disposition: A | Discharge status: Home / Self Care | Payer: Self-pay | Source: Home / Self Care | Attending: Urology

## 2020-04-10 ENCOUNTER — Other Ambulatory Visit: Payer: Self-pay

## 2020-04-10 ENCOUNTER — Ambulatory Visit (HOSPITAL_BASED_OUTPATIENT_CLINIC_OR_DEPARTMENT_OTHER)
Admission: RE | Admit: 2020-04-10 | Discharge: 2020-04-10 | Disposition: A | Discharge status: Home / Self Care | Payer: Managed Care, Other (non HMO) | Attending: Urology | Admitting: Urology

## 2020-04-10 DIAGNOSIS — N182 Chronic kidney disease, stage 2 (mild): Secondary | ICD-10-CM | POA: Diagnosis not present

## 2020-04-10 DIAGNOSIS — N5089 Other specified disorders of the male genital organs: Secondary | ICD-10-CM | POA: Insufficient documentation

## 2020-04-10 DIAGNOSIS — Z8616 Personal history of COVID-19: Secondary | ICD-10-CM | POA: Diagnosis not present

## 2020-04-10 DIAGNOSIS — Z801 Family history of malignant neoplasm of trachea, bronchus and lung: Secondary | ICD-10-CM | POA: Diagnosis not present

## 2020-04-10 DIAGNOSIS — Z8249 Family history of ischemic heart disease and other diseases of the circulatory system: Secondary | ICD-10-CM | POA: Diagnosis not present

## 2020-04-10 DIAGNOSIS — Z841 Family history of disorders of kidney and ureter: Secondary | ICD-10-CM | POA: Insufficient documentation

## 2020-04-10 DIAGNOSIS — I129 Hypertensive chronic kidney disease with stage 1 through stage 4 chronic kidney disease, or unspecified chronic kidney disease: Secondary | ICD-10-CM | POA: Diagnosis not present

## 2020-04-10 HISTORY — DX: Presence of spectacles and contact lenses: Z97.3

## 2020-04-10 HISTORY — DX: Other specified disorders of the male genital organs: N50.89

## 2020-04-10 HISTORY — PX: SCROTAL EXPLORATION: SHX2386

## 2020-04-10 HISTORY — PX: HYDROCELE EXCISION: SHX482

## 2020-04-10 LAB — POCT I-STAT, CHEM 8
BUN: 30 mg/dL — ABNORMAL HIGH (ref 6–20)
Calcium, Ion: 1.26 mmol/L (ref 1.15–1.40)
Chloride: 107 mmol/L (ref 98–111)
Creatinine, Ser: 1.2 mg/dL (ref 0.61–1.24)
Glucose, Bld: 88 mg/dL (ref 70–99)
HCT: 47 % (ref 39.0–52.0)
Hemoglobin: 16 g/dL (ref 13.0–17.0)
Potassium: 4.8 mmol/L (ref 3.5–5.1)
Sodium: 142 mmol/L (ref 135–145)
TCO2: 27 mmol/L (ref 22–32)

## 2020-04-10 SURGERY — EXPLORATION, SCROTUM
Anesthesia: General | Laterality: Left

## 2020-04-10 MED ORDER — MIDAZOLAM HCL 2 MG/2ML IJ SOLN
INTRAMUSCULAR | Status: AC
  Filled 2020-04-10: qty 2

## 2020-04-10 MED ORDER — AMISULPRIDE (ANTIEMETIC) 5 MG/2ML IV SOLN
10.0000 mg | Freq: Once | INTRAVENOUS | Status: AC
  Administered 2020-04-10: 10 mg via INTRAVENOUS

## 2020-04-10 MED ORDER — BUPIVACAINE HCL (PF) 0.25 % IJ SOLN
INTRAMUSCULAR | Status: DC | PRN
  Administered 2020-04-10: 17 mL

## 2020-04-10 MED ORDER — 0.9 % SODIUM CHLORIDE (POUR BTL) OPTIME
TOPICAL | Status: DC | PRN
  Administered 2020-04-10: 1000 mL

## 2020-04-10 MED ORDER — FENTANYL CITRATE (PF) 100 MCG/2ML IJ SOLN
INTRAMUSCULAR | Status: AC
  Filled 2020-04-10: qty 2

## 2020-04-10 MED ORDER — LACTATED RINGERS IV SOLN: INTRAVENOUS | Status: DC

## 2020-04-10 MED ORDER — ONDANSETRON HCL 4 MG/2ML IJ SOLN
INTRAMUSCULAR | Status: DC | PRN
  Administered 2020-04-10: 4 mg via INTRAVENOUS

## 2020-04-10 MED ORDER — LIDOCAINE 2% (20 MG/ML) 5 ML SYRINGE
INTRAMUSCULAR | Status: DC | PRN
  Administered 2020-04-10: 60 mg via INTRAVENOUS

## 2020-04-10 MED ORDER — LIDOCAINE 2% (20 MG/ML) 5 ML SYRINGE
INTRAMUSCULAR | Status: AC
  Filled 2020-04-10: qty 5

## 2020-04-10 MED ORDER — KETOROLAC TROMETHAMINE 30 MG/ML IJ SOLN
INTRAMUSCULAR | Status: AC
  Filled 2020-04-10: qty 1

## 2020-04-10 MED ORDER — FENTANYL CITRATE (PF) 100 MCG/2ML IJ SOLN: 25.0000 ug | INTRAMUSCULAR | Status: DC | PRN

## 2020-04-10 MED ORDER — MIDAZOLAM HCL 2 MG/2ML IJ SOLN
INTRAMUSCULAR | Status: DC | PRN
  Administered 2020-04-10: 2 mg via INTRAVENOUS

## 2020-04-10 MED ORDER — OXYCODONE HCL 5 MG PO TABS
5.0000 mg | ORAL_TABLET | Freq: Once | ORAL | Status: AC
  Administered 2020-04-10: 5 mg via ORAL

## 2020-04-10 MED ORDER — DEXAMETHASONE SODIUM PHOSPHATE 10 MG/ML IJ SOLN
INTRAMUSCULAR | Status: AC
  Filled 2020-04-10: qty 1

## 2020-04-10 MED ORDER — EPHEDRINE SULFATE-NACL 50-0.9 MG/10ML-% IV SOSY
PREFILLED_SYRINGE | INTRAVENOUS | Status: DC | PRN
  Administered 2020-04-10 (×2): 15 mg via INTRAVENOUS
  Administered 2020-04-10: 10 mg via INTRAVENOUS

## 2020-04-10 MED ORDER — BUPIVACAINE HCL (PF) 0.25 % IJ SOLN
INTRAMUSCULAR | Status: AC
  Filled 2020-04-10: qty 30

## 2020-04-10 MED ORDER — DEXAMETHASONE SODIUM PHOSPHATE 10 MG/ML IJ SOLN
INTRAMUSCULAR | Status: DC | PRN
  Administered 2020-04-10: 10 mg via INTRAVENOUS

## 2020-04-10 MED ORDER — PHENYLEPHRINE 40 MCG/ML (10ML) SYRINGE FOR IV PUSH (FOR BLOOD PRESSURE SUPPORT)
PREFILLED_SYRINGE | INTRAVENOUS | Status: DC | PRN
  Administered 2020-04-10: 120 ug via INTRAVENOUS
  Administered 2020-04-10 (×2): 80 ug via INTRAVENOUS
  Administered 2020-04-10: 120 ug via INTRAVENOUS

## 2020-04-10 MED ORDER — CEFAZOLIN SODIUM-DEXTROSE 2-4 GM/100ML-% IV SOLN
INTRAVENOUS | Status: AC
  Filled 2020-04-10: qty 100

## 2020-04-10 MED ORDER — PHENYLEPHRINE 40 MCG/ML (10ML) SYRINGE FOR IV PUSH (FOR BLOOD PRESSURE SUPPORT)
PREFILLED_SYRINGE | INTRAVENOUS | Status: AC
  Filled 2020-04-10: qty 10

## 2020-04-10 MED ORDER — HYDROCODONE-ACETAMINOPHEN 5-325 MG PO TABS: 1.0000 | ORAL_TABLET | ORAL | 0 refills | Status: DC | PRN

## 2020-04-10 MED ORDER — EPHEDRINE 5 MG/ML INJ
INTRAVENOUS | Status: AC
  Filled 2020-04-10: qty 10

## 2020-04-10 MED ORDER — PROPOFOL 10 MG/ML IV BOLUS
INTRAVENOUS | Status: AC
  Filled 2020-04-10: qty 40

## 2020-04-10 MED ORDER — ONDANSETRON HCL 4 MG/2ML IJ SOLN
INTRAMUSCULAR | Status: AC
  Filled 2020-04-10: qty 2

## 2020-04-10 MED ORDER — PROPOFOL 10 MG/ML IV BOLUS
INTRAVENOUS | Status: DC | PRN
  Administered 2020-04-10: 300 mg via INTRAVENOUS

## 2020-04-10 MED ORDER — OXYCODONE HCL 5 MG PO TABS
ORAL_TABLET | ORAL | Status: AC
  Filled 2020-04-10: qty 1

## 2020-04-10 MED ORDER — AMISULPRIDE (ANTIEMETIC) 5 MG/2ML IV SOLN
INTRAVENOUS | Status: AC
  Filled 2020-04-10: qty 4

## 2020-04-10 MED ORDER — CEFAZOLIN SODIUM-DEXTROSE 2-4 GM/100ML-% IV SOLN
2.0000 g | INTRAVENOUS | Status: AC
  Administered 2020-04-10: 2 g via INTRAVENOUS

## 2020-04-10 MED ORDER — FENTANYL CITRATE (PF) 100 MCG/2ML IJ SOLN
INTRAMUSCULAR | Status: DC | PRN
  Administered 2020-04-10: 25 ug via INTRAVENOUS
  Administered 2020-04-10: 100 ug via INTRAVENOUS

## 2020-04-10 SURGICAL SUPPLY — 69 items
ADH SKN CLS APL DERMABOND .7 (GAUZE/BANDAGES/DRESSINGS) ×1
APL SKNCLS STERI-STRIP NONHPOA (GAUZE/BANDAGES/DRESSINGS) ×1
BENZOIN TINCTURE PRP APPL 2/3 (GAUZE/BANDAGES/DRESSINGS) ×2 IMPLANT
BLADE CLIPPER SENSICLIP SURGIC (BLADE) IMPLANT
BLADE SURG 10 STRL SS (BLADE) ×2 IMPLANT
BLADE SURG 15 STRL LF DISP TIS (BLADE) ×1 IMPLANT
BLADE SURG 15 STRL SS (BLADE) ×2
BNDG GAUZE ELAST 4 BULKY (GAUZE/BANDAGES/DRESSINGS) ×2 IMPLANT
CANISTER SUCT 1200ML W/VALVE (MISCELLANEOUS) ×2 IMPLANT
CANISTER SUCT 3000ML PPV (MISCELLANEOUS) IMPLANT
CLEANER CAUTERY TIP 5X5 PAD (MISCELLANEOUS) ×1 IMPLANT
CLOTH BEACON ORANGE TIMEOUT ST (SAFETY) ×2 IMPLANT
COVER BACK TABLE 60X90IN (DRAPES) ×2 IMPLANT
COVER MAYO STAND STRL (DRAPES) ×2 IMPLANT
COVER WAND RF STERILE (DRAPES) ×2 IMPLANT
DERMABOND ADVANCED (GAUZE/BANDAGES/DRESSINGS) ×1
DERMABOND ADVANCED .7 DNX12 (GAUZE/BANDAGES/DRESSINGS) ×1 IMPLANT
DISSECTOR ROUND CHERRY 3/8 STR (MISCELLANEOUS) IMPLANT
DRAIN PENROSE 0.25X18 (DRAIN) ×2 IMPLANT
DRAPE LAPAROTOMY 100X72 PEDS (DRAPES) ×2 IMPLANT
DRAPE LAPAROTOMY T 102X78X121 (DRAPES) ×2 IMPLANT
DRSG TEGADERM 2-3/8X2-3/4 SM (GAUZE/BANDAGES/DRESSINGS) ×2 IMPLANT
DRSG TELFA 3X8 NADH (GAUZE/BANDAGES/DRESSINGS) ×2 IMPLANT
ELECT NDL TIP 2.8 STRL (NEEDLE) ×1 IMPLANT
ELECT NEEDLE TIP 2.8 STRL (NEEDLE) ×2 IMPLANT
ELECT REM PT RETURN 9FT ADLT (ELECTROSURGICAL) ×2
ELECTRODE REM PT RTRN 9FT ADLT (ELECTROSURGICAL) ×1 IMPLANT
GLOVE SURG ENC MOIS LTX SZ8 (GLOVE) ×2 IMPLANT
GOWN STRL REUS W/TWL LRG LVL3 (GOWN DISPOSABLE) ×4 IMPLANT
GOWN STRL REUS W/TWL XL LVL3 (GOWN DISPOSABLE) ×2 IMPLANT
KIT TURNOVER CYSTO (KITS) ×2 IMPLANT
MANIFOLD NEPTUNE II (INSTRUMENTS) IMPLANT
NDL HYPO 25X1 1.5 SAFETY (NEEDLE) ×1 IMPLANT
NEEDLE HYPO 22GX1.5 SAFETY (NEEDLE) IMPLANT
NEEDLE HYPO 25X1 1.5 SAFETY (NEEDLE) ×2 IMPLANT
NS IRRIG 500ML POUR BTL (IV SOLUTION) ×2 IMPLANT
PACK BASIN DAY SURGERY FS (CUSTOM PROCEDURE TRAY) ×2 IMPLANT
PAD CLEANER CAUTERY TIP 5X5 (MISCELLANEOUS) ×1
PAD DRESSING TELFA 3X8 NADH (GAUZE/BANDAGES/DRESSINGS) ×1 IMPLANT
PENCIL SMOKE EVACUATOR (MISCELLANEOUS) ×2 IMPLANT
SET COLLECT BLD 21X.75 12 PB G (NEEDLE) IMPLANT
STRIP CLOSURE SKIN 1/4X4 (GAUZE/BANDAGES/DRESSINGS) ×2 IMPLANT
SUPPORT SCROTAL LG STRP (MISCELLANEOUS) ×2 IMPLANT
SUPPORT SCROTAL MED ADLT STRP (MISCELLANEOUS) IMPLANT
SUT CHROMIC 2 0 SH (SUTURE) ×2 IMPLANT
SUT CHROMIC 3 0 SH 27 (SUTURE) IMPLANT
SUT CHROMIC 4 0 SH 27 (SUTURE) IMPLANT
SUT MNCRL AB 4-0 PS2 18 (SUTURE) ×2 IMPLANT
SUT PDS AB 5-0 P3 18 (SUTURE) ×2 IMPLANT
SUT PROLENE 4 0 RB 1 (SUTURE)
SUT PROLENE 4-0 RB1 .5 CRCL 36 (SUTURE) IMPLANT
SUT SILK 0 TIES 10X30 (SUTURE) ×2 IMPLANT
SUT SILK 2 0 30  PSL (SUTURE) ×2
SUT SILK 2 0 30 PSL (SUTURE) ×1 IMPLANT
SUT SILK 3 0 SH 30 (SUTURE) ×2 IMPLANT
SUT VIC AB 2-0 SH 27 (SUTURE) ×2
SUT VIC AB 2-0 SH 27XBRD (SUTURE) IMPLANT
SUT VIC AB 3-0 SH 27 (SUTURE) ×2
SUT VIC AB 3-0 SH 27X BRD (SUTURE) ×1 IMPLANT
SUT VIC AB 4-0 SH 18 (SUTURE) ×2 IMPLANT
SUT VIC AB 4-0 SH 27 (SUTURE)
SUT VIC AB 4-0 SH 27XANBCTRL (SUTURE) IMPLANT
SYR BULB IRRIG 60ML STRL (SYRINGE) IMPLANT
SYR CONTROL 10ML LL (SYRINGE) ×2 IMPLANT
TOWEL OR 17X26 10 PK STRL BLUE (TOWEL DISPOSABLE) ×4 IMPLANT
TRAY DSU PREP LF (CUSTOM PROCEDURE TRAY) ×2 IMPLANT
TUBE CONNECTING 12X1/4 (SUCTIONS) ×2 IMPLANT
WATER STERILE IRR 500ML POUR (IV SOLUTION) ×2 IMPLANT
YANKAUER SUCT BULB TIP NO VENT (SUCTIONS) ×2 IMPLANT

## 2020-04-10 NOTE — Discharge Instructions (Signed)
HOME CARE INSTRUCTIONS FOR SCROTAL PROCEDURES  Wound Care & Hygiene: You may apply an ice bag to the scrotum for the first 24 hours.  This may help decrease swelling and soreness.  You may have a dressing held in place by an athletic supporter.  You may remove the dressing in 24 hours and shower in 48 hours.  Continue to use the athletic supporter or tight briefs for at least a week. Activity: Rest today - not necessarily flat bed rest.  Just take it easy.  You should not do strenuous activities until your follow-up visit with your doctor.  You may resume light activity in 48 hours.  Return to Work:  Your doctor will advise you of this depending on the type of work you do  Diet: Drink liquids or eat a light diet this evening.  You may resume a regular diet tomorrow.  General Expectations: You may have a small amount of bleeding.  The scrotum may be swollen or bruised for about a week.  Call your Doctor if these occur:  -persistent or heavy bleeding  -temperature of 101 degrees or more  -severe pain, not relieved by your pain medication  Return to [employer]:  Call to set up and appointment.  Patient Signature:  __________________________________________________  Nurse's Signature:  __________________________________________________   Post Anesthesia Home Care Instructions  Activity: Get plenty of rest for the remainder of the day. A responsible individual must stay with you for 24 hours following the procedure.  For the next 24 hours, DO NOT: -Drive a car -Paediatric nurse -Drink alcoholic beverages -Take any medication unless instructed by your physician -Make any legal decisions or sign important papers.  Meals: Start with liquid foods such as gelatin or soup. Progress to regular foods as tolerated. Avoid greasy, spicy, heavy foods. If nausea and/or vomiting occur, drink only clear liquids until the nausea and/or vomiting subsides. Call your physician if vomiting  continues.  Special Instructions/Symptoms: Your throat may feel dry or sore from the anesthesia or the breathing tube placed in your throat during surgery. If this causes discomfort, gargle with warm salt water. The discomfort should disappear within 24 hours.

## 2020-04-10 NOTE — Interval H&P Note (Signed)
History and Physical Interval Note:  04/10/2020 9:04 AM  Patrick Campbell  has presented today for surgery, with the diagnosis of LEFT EPIDIDYMAL MASS.  The various methods of treatment have been discussed with the patient and family. After consideration of risks, benefits and other options for treatment, the patient has consented to  Procedure(s) with comments: INGUINAL EXPLORATION AND EXCISION OF EPIDIDYMAL MASS (Left) - 1 HR ORCHIECTOMY (Left) HYDROCELECTOMY ADULT (Left) as a surgical intervention.  The patient's history has been reviewed, patient examined, no change in status, stable for surgery.  I have reviewed the patient's chart and labs.  Questions were answered to the patient's satisfaction.     Lillette Boxer Lisbeth Puller

## 2020-04-10 NOTE — Op Note (Signed)
Preoperative diagnosis: Left epididymal mass  Postoperative diagnosis: Left spermatic cord mass, presumptive diagnosis granulomatous reaction  Principal procedure: Inguinal exploration for left testicle, biopsy of spermatic cord mass, spermatic cord block  Surgeon: Kaetlin Bullen  Anesthesia: General with LMA  Complications: None  Estimated blood loss: 50 mL  Specimen: Spermatic cord biopsy for frozen and permanent  Indications: 60 year old male with recently diagnosed mass in his scrotum.  I felt that this was actually more likely epididymal in nature.  He does have a mild hydrocele on the left.  Ultrasound revealed a normal testicle with 2 cm solid structure in his epididymis/distal cord.  He presents at this time for biopsy, possible left radical orchiectomy.  I discussed the procedure as well as risks and complications with the patient.  He is understands these and desires to proceed.  Findings: The patient did have a mild hydrocele.  Testicle and epididymis appeared normal.  There was a firm mass, approximately 2 cm in the distal cord.  The rest of the cord was normal.  Incising the mass there was firm tissue that looked more to be scar tissue then a neoplasm.  Description of procedure: The patient was properly identified and marked in the holding area.  He was taken to the operating room where general anesthetic was administered.  He was placed in the supine position.  IV antibiotics were administered.  Genitalia and perineum were prepped and draped.  Proper timeout was performed.  2 cm incision made in the left inguinal region in an oblique fashion, carried down through Scarpa's fascia with electrocautery and blunt dissection.  The cord was identified coming from the external inguinal ring.  It was circumscribed, and occluded with a Penrose drain.  Dissection was then carried distally on the cord, taking care to avoid injury to the ilioinguinal nerve which was located lateral.  Dissection was  carried down to the tunica vaginalis and gubernaculum.  The testicle and structures were then delivered through the wound.  The patient's tunica vaginalis was opened and fluid was removed.  Inspection was then carried out of the testicle and epididymis, both were normal.  There was firm tissue in the distal cord.  Careful dissection was carried out down to this area and an incisional biopsy was taken and sent for permanent and frozen section.  While we were waiting for the frozen diagnosis, the bed of the incision was then cauterized.  I did release the tourniquet on the cord and there was adequate hemostasis.  I also checked the everted scrotum at this point for hemostasis, this was found to be present.  No bleeding was seen in the wound base.  Cord block was performed using 8 cc of quarter percent Marcaine, incision was then also infiltrated with 12 cc of Marcaine, quarter percent plain.  Word came back from pathology that this was the granulomatous inflammation.  At this point I did not feel that further incision/excision of this area was necessary.  The tourniquet was released, no bleeding was seen in the site of biopsy.  This was then oversewn with a 2-0 Vicryl placed in a simple running fashion.  The testicle and cord structures were then replaced anatomically.  I then closed the subcutaneous tissue with a running 2-0 Vicryl placed in a simple running fashion.  Skin edges were reapproximated with a 4-0 Monocryl placed in a running subcuticular fashion.  At this point, the wound was cleansed and Dermabond was placed.  After dressing was placed with a jockstrap, the  patient was awakened and taken to the PACU in stable condition, having tolerated the procedure well.  Sponge needle and instrument counts were correct x2.

## 2020-04-10 NOTE — H&P (Signed)
H&P  Chief Complaint: Left scrotal mass  History of Present Illness: 60 yo male presents for surgical mgmt of a 2 cmleft epididymal mass  Past Medical History:  Diagnosis Date  . Acquired renal cyst of left kidney 69/6295   Complicated cyst vs solid mass: contrast enhanced CT was recommended but ?apparently not done?  . Chronic renal insufficiency, stage II (mild)    Borderline stage II/III (CrCl estimate @ low 60s)  . COVID-19 virus detected 09/2018   congrstion and low fever x 2 and 1/2 weeks all symptoms resolved  . Epididymal cyst 02/2020   LEFT->pt desires excision so Dr. Diona Fanti will do  . Epididymal mass    left  . History of gallstones    Lap chole 12/2018  . History of kidney stones    ongoing L nephrolithiasis as of 08/2019 urol f/u  . Hyperlipidemia   . Hypertension   . Injury 04/01/2020   right foot injury dropped weight on foot bruised and swollen using ice prn skin intact  . Polycythemia 05/10/2010   EPO level normal-- IDIOPATHIC  . 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 Wears glasses    for reading    Past Surgical History:  Procedure Laterality Date  . CHOLECYSTECTOMY N/A 01/10/2019   Procedure: LAPAROSCOPIC CHOLECYSTECTOMY;  Surgeon: Coralie Keens, MD;  Location: [facility];  Service: General;  Laterality: N/A;  . COLONOSCOPY  09/30/11   Normal-repeat in 10 yrs  . CYSTOSCOPY WITH STENT PLACEMENT Left 05/29/2015   Procedure: CYSTOSCOPY WITH STENT PLACEMENT, RETROGRADE;  Surgeon: Cleon Gustin, MD;  Location: [facility];  Service: Urology;  Laterality: Left;  . CYSTOSCOPY/RETROGRADE/URETEROSCOPY/STONE EXTRACTION WITH BASKET Left 06/15/2015   Procedure: CYSTOSCOPY/RETROGRADE/URETEROSCOPY/STONE EXTRACTION WITH BASKET WITH STENT EXCHANGE;  Surgeon: Cleon Gustin, MD;  Location[facility];  Service: Urology;  Laterality: Left;  . HOLMIUM LASER APPLICATION Left 02/21/4130   Procedure: HOLMIUM LASER  APPLICATION;  Surgeon: Cleon Gustin, MD;  Location: [facility];  Service: Urology;  Laterality: Left;  . KNEE SURGERY Right 2002   arthroscopic    Home Medications:    Allergies: No Known Allergies  Family History  Problem Relation Age of Onset  . Hypertension Mother   . Food Allergy Mother   . Cancer Father        lung/ smoker  . Kidney disease Brother   . HIV Brother     Social History:  reports that he has never smoked. He has never used smokeless tobacco. He reports current alcohol use. He reports that he does not use drugs.  ROS: A complete review of systems was performed.  All systems are negative except for pertinent findings as noted.  Physical Exam:  Vital signs in last 24 hours: BP (!) 160/90   Pulse 67   Temp (!) 97 F (36.1 C) (Oral)   Resp 16   Ht 5\' 11"  (1.803 m)   Wt 97.5 kg   SpO2 97%   BMI 29.99 kg/m  Constitutional:  Alert and oriented, No acute distress Cardiovascular: Regular rate  Respiratory: Normal respiratory effort GI: Abdomen is soft, nontender, nondistended, no abdominal masses. No CVAT.  Genitourinary: Normal male phallus, testes are descended bilaterally and non-tender and without masses, scrotum is normal in appearance without lesions or masses, perineum is normal on inspection.2 cm left epediddymal mass Lymphatic: No lymphadenopathy Neurologic: Grossly intact, no focal deficits Psychiatric: Normal mood and affect  Laboratory Data:  No results for input(s): WBC, HGB, HCT, PLT in the last 72 hours.  No results for input(s): NA, K, CL, GLUCOSE, BUN, CALCIUM, CREATININE in the last 72 hours.  Invalid input(s): CO3   No results found for this or any previous visit (from the past 24 hour(s)). Recent Results (from the past 240 hour(s))  SARS CORONAVIRUS 2 (TAT 6-24 HRS) Nasopharyngeal Nasopharyngeal Swab     Status: None   Collection Time: 04/06/20 12:30 PM   Specimen: Nasopharyngeal Swab  Result Value Ref Range  Status   SARS Coronavirus 2 NEGATIVE NEGATIVE Final    Comment: (NOTE) SARS-CoV-2 target nucleic acids are NOT DETECTED.  The SARS-CoV-2 RNA is generally detectable in upper and lower respiratory specimens during the acute phase of infection. Negative results do not preclude SARS-CoV-2 infection, do not rule out co-infections with other pathogens, and should not be used as the sole basis for treatment or other patient management decisions. Negative results must be combined with clinical observations, patient history, and epidemiological information. The expected result is Negative.  Fact Sheet for Patients: SugarRoll.be  Fact Sheet for Healthcare Providers: https://www.woods-mathews.com/  This test is not yet approved or cleared by the [place] FDA and  has been authorized for detection and/or diagnosis of SARS-CoV-2 by FDA under an Emergency Use Authorization (EUA). This EUA will remain  in effect (meaning this test can be used) for the duration of the COVID-19 declaration under Se ction 564(b)(1) of the Act, 21 U.S.C. section 360bbb-3(b)(1), unless the authorization is terminated or revoked sooner.  Performed at [facility], [address] [place], [place] [address]     Renal Function: Recent Labs    04/05/20 0844  CREATININE 1.09   Estimated Creatinine Clearance: 88 mL/min (by C-G formula based on SCr of 1.09 mg/dL).  Radiologic Imaging: No results found.  Impression/Assessment:  Left epedidymal mass  Plan:  LEft inguinal exploration, excision of left epididymal mass, possible left radical orchiectomy

## 2020-04-10 NOTE — Anesthesia Preprocedure Evaluation (Addendum)
Anesthesia Evaluation  Patient identified by MRN, date of birth, ID band Patient awake    Reviewed: Allergy & Precautions, NPO status   Airway Mallampati: II  TM Distance: >3 FB     Dental   Pulmonary    breath sounds clear to auscultation       Cardiovascular hypertension,  Rhythm:Regular Rate:Normal     Neuro/Psych  Headaches,    GI/Hepatic negative GI ROS, Neg liver ROS,   Endo/Other    Renal/GU Renal disease     Musculoskeletal   Abdominal   Peds  Hematology   Anesthesia Other Findings   Reproductive/Obstetrics                             Anesthesia Physical Anesthesia Plan  ASA: III  Anesthesia Plan: General   Post-op Pain Management:    Induction: Intravenous  PONV Risk Score and Plan: 3 and Ondansetron, Dexamethasone and Midazolam  Airway Management Planned: LMA  Additional Equipment:   Intra-op Plan:   Post-operative Plan: Extubation in OR  Informed Consent: I have reviewed the patients History and Physical, chart, labs and discussed the procedure including the risks, benefits and alternatives for the proposed anesthesia with the patient or authorized representative who has indicated his/her understanding and acceptance.     Dental advisory given  Plan Discussed with: CRNA and Anesthesiologist  Anesthesia Plan Comments:         Anesthesia Quick Evaluation

## 2020-04-10 NOTE — Anesthesia Procedure Notes (Signed)
Procedure Name: LMA Insertion Performed by: Mechele Claude, CRNA Pre-anesthesia Checklist: Patient identified, Emergency Drugs available, Suction available and Patient being monitored Patient Re-evaluated:Patient Re-evaluated prior to induction Oxygen Delivery Method: Circle system utilized Preoxygenation: Pre-oxygenation with 100% oxygen Induction Type: IV induction Ventilation: Mask ventilation without difficulty LMA: LMA inserted LMA Size: 5.0 Number of attempts: 1 Airway Equipment and Method: Bite block Placement Confirmation: positive ETCO2 Tube secured with: Tape Dental Injury: Teeth and Oropharynx as per pre-operative assessment

## 2020-04-11 ENCOUNTER — Encounter (HOSPITAL_BASED_OUTPATIENT_CLINIC_OR_DEPARTMENT_OTHER): Payer: Self-pay | Admitting: Urology

## 2020-04-11 NOTE — Transfer of Care (Signed)
Immediate Anesthesia Transfer of Care Note  Patient: Patrick Campbell  Procedure(s) Performed: Procedure(s) (LRB): INGUINAL EXPLORATION AND EXCISION OF EPIDIDYMAL MASS (Left) HYDROCELECTOMY ADULT (Left)  Patient Location: PACU  Anesthesia Type: General  Level of Consciousness: awake, alert  and oriented  Airway & Oxygen Therapy: Patient Spontanous Breathing and Patient connected to nasal cannula oxygen  Post-op Assessment: Report given to PACU RN and Post -op Vital signs reviewed and stable  Post vital signs: Reviewed and stable  Complications: No apparent anesthesia complications Last Vitals:  Vitals Value Taken Time  BP 135/79 04/10/20 1240  Temp 36.4 C 04/10/20 1240  Pulse 65 04/10/20 1240  Resp 16 04/10/20 1240  SpO2 100 % 04/10/20 1240    Last Pain:  Vitals:   04/10/20 1308  TempSrc:   PainSc: 7          Complications: No complications documented.

## 2020-04-12 LAB — SURGICAL PATHOLOGY

## 2020-04-12 NOTE — Anesthesia Postprocedure Evaluation (Signed)
Anesthesia Post Note  Patient: Patrick Campbell  Procedure(s) Performed: INGUINAL EXPLORATION AND EXCISION OF EPIDIDYMAL MASS (Left ) HYDROCELECTOMY ADULT (Left )     Anesthesia Post Evaluation No complications documented.  Last Vitals:  Vitals:   04/10/20 1200 04/10/20 1240  BP: 136/85 135/79  Pulse: 74 65  Resp: 18 16  Temp: (!) 36.4 C 36.4 C  SpO2: 95% 100%    Last Pain:  Vitals:   04/11/20 0948  TempSrc:   PainSc: 2                  Sacheen Arrasmith

## 2020-04-13 ENCOUNTER — Encounter: Payer: Self-pay | Admitting: Family Medicine

## 2020-04-21 ENCOUNTER — Other Ambulatory Visit: Payer: Self-pay

## 2020-04-21 ENCOUNTER — Ambulatory Visit (HOSPITAL_BASED_OUTPATIENT_CLINIC_OR_DEPARTMENT_OTHER)
Admission: RE | Admit: 2020-04-21 | Discharge: 2020-04-21 | Disposition: A | Discharge status: Home / Self Care | Payer: Managed Care, Other (non HMO) | Source: Ambulatory Visit | Attending: Family Medicine | Admitting: Family Medicine

## 2020-04-21 ENCOUNTER — Other Ambulatory Visit: Payer: Self-pay | Admitting: Family Medicine

## 2020-04-21 ENCOUNTER — Encounter: Payer: Self-pay | Admitting: Family Medicine

## 2020-04-21 DIAGNOSIS — I10 Essential (primary) hypertension: Secondary | ICD-10-CM

## 2020-04-21 DIAGNOSIS — E7800 Pure hypercholesterolemia, unspecified: Secondary | ICD-10-CM

## 2020-04-21 MED ORDER — ATORVASTATIN CALCIUM 80 MG PO TABS: 80.0000 mg | ORAL_TABLET | Freq: Every day | ORAL | 3 refills | Status: DC

## 2020-04-26 ENCOUNTER — Other Ambulatory Visit: Payer: Self-pay

## 2020-04-26 MED ORDER — ATORVASTATIN CALCIUM 80 MG PO TABS: 80.0000 mg | ORAL_TABLET | Freq: Every day | ORAL | 3 refills | Status: DC

## 2020-04-26 NOTE — Progress Notes (Signed)
Received fax from pharmacy that [facility] pharmacies cannot dispense retail drug therapies. Will have to send rx to local pharmacy instead.

## 2020-06-16 ENCOUNTER — Encounter: Payer: Self-pay | Admitting: Family Medicine

## 2020-06-16 ENCOUNTER — Ambulatory Visit: Payer: Managed Care, Other (non HMO) | Admitting: Family Medicine

## 2020-06-16 ENCOUNTER — Other Ambulatory Visit: Payer: Self-pay

## 2020-06-16 VITALS — BP 140/82 | HR 69 | Temp 97.4°F | Wt 215.1 lb

## 2020-06-16 DIAGNOSIS — M545 Low back pain, unspecified: Secondary | ICD-10-CM | POA: Diagnosis not present

## 2020-06-16 MED ORDER — TRAMADOL HCL 50 MG PO TABS: ORAL_TABLET | ORAL | 0 refills | Status: DC

## 2020-06-16 NOTE — Progress Notes (Signed)
OFFICE VISIT  06/16/2020  CC:  Chief Complaint  Patient presents with  . Back Pain    HPI:    Patient is a 60 y.o. African-American male who presents for back pain. Onset about a week ago, center of lower back, no preceding strain or trauma. Pretty constant, w/out periods of spasm.  He does work out regularly but recalls no strain or signif change in workout regimen. No radiation, no paresthesias.  No LE weakness.  Prolonged sitting makes it worse, bending forward makes it worse.  Got a deep tissue massage yesterday and it did not help.   Tylenol and ibup have been tried.  Had a tramadol at home so took one yesterday and it did help some. No NSAID on regular basis for this. No f/c/wt loss.  Reviewed EMR and found no imaging of his back in the past.  Past Medical History:  Diagnosis Date  . Acquired renal cyst of left kidney 32/6712   Complicated cyst vs solid mass: contrast enhanced CT was recommended but ?apparently not done?  . CAD (coronary artery disease)    1 vessel on coronary CT, Calcium score 69th %'tile for age, recommended inc statin and start ASA (04/2020)  . Chronic renal insufficiency, stage II (mild)    Borderline stage II/III (CrCl estimate @ low 60s)  . COVID-19 virus detected 09/2018   congrstion and low fever x 2 and 1/2 weeks all symptoms resolved  . Epididymal mass    left  . History of gallstones    Lap chole 12/2018  . History of kidney stones    ongoing L nephrolithiasis as of 08/2019 urol f/u  . Hyperlipidemia   . Hypertension   . Injury 04/01/2020   right foot injury dropped weight on foot bruised and swollen using ice prn skin intact  . Polycythemia 05/10/2010   EPO level normal-- IDIOPATHIC  . 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 Wears glasses    for reading    Past Surgical History:  Procedure Laterality Date  . CHOLECYSTECTOMY N/A 01/10/2019   Procedure: LAPAROSCOPIC CHOLECYSTECTOMY;  Surgeon:  Coralie Keens, MD;  Location: [facility];  Service: General;  Laterality: N/A;  . COLONOSCOPY  09/30/11   Normal-repeat in 10 yrs  . CYSTOSCOPY WITH STENT PLACEMENT Left 05/29/2015   Procedure: CYSTOSCOPY WITH STENT PLACEMENT, RETROGRADE;  Surgeon: Cleon Gustin, MD;  Location: [facility];  Service: Urology;  Laterality: Left;  . CYSTOSCOPY/RETROGRADE/URETEROSCOPY/STONE EXTRACTION WITH BASKET Left 06/15/2015   Procedure: CYSTOSCOPY/RETROGRADE/URETEROSCOPY/STONE EXTRACTION WITH BASKET WITH STENT EXCHANGE;  Surgeon: Cleon Gustin, MD;  Location: [facility];  Service: Urology;  Laterality: Left;  . HOLMIUM LASER APPLICATION Left 04/18/8097   Procedure: HOLMIUM LASER APPLICATION;  Surgeon: Cleon Gustin, MD;  Location: [facility];  Service: Urology;  Laterality: Left;  . HYDROCELE EXCISION Left 04/10/2020   Procedure: HYDROCELECTOMY ADULT;  Surgeon: Franchot Gallo, MD;  Location: [facility];  Service: Urology;  Laterality: Left;  . KNEE SURGERY Right 2002   arthroscopic  . SCROTAL EXPLORATION Left 04/10/2020   benign L epididymal mass.  Procedure: INGUINAL EXPLORATION AND EXCISION OF EPIDIDYMAL MASS;  Surgeon: Franchot Gallo, MD;  Location: [facility];  Service: Urology;  Laterality: Left;  1 HR    Outpatient Medications Prior to Visit  Medication Sig Dispense Refill  . amLODipine (NORVASC) 10 MG tablet Take 1 tablet (10 mg total) by mouth daily. 90 tablet  2  . atorvastatin (LIPITOR) 80 MG tablet Take 1 tablet (80 mg total) by mouth daily. 90 tablet 3  . diclofenac (CATAFLAM) 50 MG tablet Take 50 mg by mouth 3 (three) times daily as needed.    Marland Kitchen HYDROcodone-acetaminophen (NORCO/VICODIN) 5-325 MG tablet Take 1 tablet by mouth every 4 (four) hours as needed for moderate pain. 10 tablet 0   No facility-administered medications prior to visit.    No Known Allergies  ROS As per HPI  PE: Vitals with BMI 06/16/2020  04/10/2020 04/10/2020  Height - - -  Weight 215 lbs 2 oz - -  BMI - - -  Systolic 947 096 283  Diastolic 82 79 85  Pulse 69 65 74     Gen: Alert, well appearing.  Patient is oriented to person, place, time, and situation. AFFECT: pleasant, lucid thought and speech. BACK EXAM:   Spine appears straight, back without bruising or other skin abnormality. Pt demonstrates normal ROM of L spine in forward flexion, extension, lateral flexion, and rotation, notes pain in LB with forward flexion of L spine.  Palpation of back reveals no tenderness in paraspinous soft tissues, facet joint regions, and over spinous processes.   SLR neg bilaterally.  LE strength 5/5 in proximal and distal muscles bilaterally.     LABS:    Chemistry      Component Value Date/Time   NA 142 04/10/2020 0819   NA 140 08/01/2018 0000   K 4.8 04/10/2020 0819   CL 107 04/10/2020 0819   CO2 26 04/05/2020 0844   BUN 30 (H) 04/10/2020 0819   BUN 22 (A) 08/01/2018 0000   CREATININE 1.20 04/10/2020 0819   GLU 82 08/01/2018 0000      Component Value Date/Time   CALCIUM 9.3 04/05/2020 0844   ALKPHOS 66 04/05/2020 0844   AST 22 04/05/2020 0844   ALT 43 04/05/2020 0844   BILITOT 0.5 04/05/2020 0844     Lab Results  Component Value Date   CHOL 175 04/05/2020   HDL 50.50 04/05/2020   LDLCALC 105 (H) 04/05/2020   TRIG 97.0 04/05/2020   CHOLHDL 3 04/05/2020   Lab Results  Component Value Date   HGBA1C 5.5 09/01/2017   IMPRESSION AND PLAN:  Acute musculoskeletal low back pain without radiculopathy. Discussed relative rest, no workouts for 1 wk, no bike riding for 1 week. Take ibup 600 bid with food for 7d. Apply heat 59min 1-2 times a day. Home stretching encouraged. I rx'd tramadol 50mg , 1-2 tid prn, #15, no RF.  If not signif improving in 1 wk then we'll check lumbar plain films and refer to PT.  An After Visit Summary was printed and given to the patient.  FOLLOW UP: Return if symptoms worsen or fail  to improve.  Signed:  Crissie Sickles, MD           06/16/2020

## 2020-06-21 ENCOUNTER — Telehealth: Payer: Self-pay

## 2020-06-21 NOTE — Telephone Encounter (Signed)
Please advise. Pt seen on 06/16/20

## 2020-06-21 NOTE — Telephone Encounter (Signed)
Patient still having back pain.  Would like to get order for xray,etc to see what is wrong and what is next step?  [phone]

## 2020-06-22 ENCOUNTER — Other Ambulatory Visit: Payer: Self-pay

## 2020-06-22 ENCOUNTER — Ambulatory Visit (INDEPENDENT_AMBULATORY_CARE_PROVIDER_SITE_OTHER): Payer: Managed Care, Other (non HMO)

## 2020-06-22 DIAGNOSIS — E7800 Pure hypercholesterolemia, unspecified: Secondary | ICD-10-CM | POA: Diagnosis not present

## 2020-06-22 LAB — LIPID PANEL
Cholesterol: 147 mg/dL (ref 0–200)
HDL: 45.2 mg/dL (ref >39.00)
LDL Cholesterol: 80 mg/dL (ref 0–99)
NonHDL: 101.41
Total CHOL/HDL Ratio: 3
Triglycerides: 106 mg/dL (ref 0.0–149.0)
VLDL: 21.2 mg/dL (ref 0.0–40.0)

## 2020-06-22 LAB — AST: AST: 33 U/L (ref 0–37)

## 2020-06-22 LAB — ALT: ALT: 64 U/L — ABNORMAL HIGH (ref 0–53)

## 2020-06-23 ENCOUNTER — Other Ambulatory Visit: Payer: Managed Care, Other (non HMO)

## 2020-06-23 ENCOUNTER — Other Ambulatory Visit: Payer: Self-pay

## 2020-06-23 DIAGNOSIS — R7401 Elevation of levels of liver transaminase levels: Secondary | ICD-10-CM

## 2020-06-26 ENCOUNTER — Telehealth: Payer: Self-pay

## 2020-06-26 DIAGNOSIS — M545 Low back pain, unspecified: Secondary | ICD-10-CM

## 2020-06-26 LAB — HEPATITIS B CORE ANTIBODY, IGM: Hep B C IgM: NONREACTIVE

## 2020-06-26 LAB — HEPATITIS C ANTIBODY
Hepatitis C Ab: NONREACTIVE
SIGNAL TO CUT-OFF: 0.01 (ref <1.00)

## 2020-06-26 LAB — HEPATITIS B SURFACE ANTIGEN: Hepatitis B Surface Ag: NONREACTIVE

## 2020-06-26 NOTE — Telephone Encounter (Signed)
Pt was last seen 6/3, "If not signif improving in 1 wk then we'll check lumbar plain films and refer to PT."   Please Advise.

## 2020-06-26 NOTE — Telephone Encounter (Signed)
[facility] - Client Nonclinical Telephone Record  [facility] Client [facility] - Client Client Site [place] - Day Physician Crissie Sickles - MD Contact Type Call Who Is Calling Patient / Member / Family / Caregiver Caller Name [name] Caller Phone Number [phone] Patient Name Patrick Campbell Patient DOB 03-10-1960 Call Type Message Only Information Provided Reason for Call Request to Schedule Office Appointment Initial Comment Caller states he was seen last week for back pain. He states it is getting worse and would like to get an x ray. Disp. Time Disposition Final User 06/26/2020 7:57:26 AM General Information Provided Yes Vinnie Level Call Closed By: Vinnie Level Transaction Date/Time: 06/26/2020 7:54:58 AM (ET

## 2020-06-26 NOTE — Telephone Encounter (Signed)
Tried calling pt, unable to leave VM. Imaging ordered

## 2020-06-26 NOTE — Telephone Encounter (Signed)
Ok back x-ray ordered for med center [place]

## 2020-06-27 ENCOUNTER — Other Ambulatory Visit: Payer: Self-pay

## 2020-06-27 DIAGNOSIS — R7401 Elevation of levels of liver transaminase levels: Secondary | ICD-10-CM

## 2020-06-27 NOTE — Telephone Encounter (Signed)
FYI. Please see below.   He was evaluated yesterday and given rx for prednisone, muscle relaxer at urgent care in [place].

## 2020-06-27 NOTE — Telephone Encounter (Signed)
Tried calling pt, unable to leave VM. Need to find out which UC he went to in order to request records, he did not provide name earlier when we spoke

## 2020-06-27 NOTE — Telephone Encounter (Signed)
Ok can someone request records from his UC visit?

## 2020-06-28 NOTE — Telephone Encounter (Signed)
Spoke with pt regarding recent UC visit, he went to [facility] on [address]. Requesting records from appt

## 2020-06-28 NOTE — Telephone Encounter (Signed)
Records received

## 2020-07-03 ENCOUNTER — Emergency Department (HOSPITAL_BASED_OUTPATIENT_CLINIC_OR_DEPARTMENT_OTHER)
Admission: EM | Admit: 2020-07-03 | Discharge: 2020-07-03 | Disposition: A | Discharge status: Home / Self Care | Payer: Managed Care, Other (non HMO) | Attending: Emergency Medicine | Admitting: Emergency Medicine

## 2020-07-03 ENCOUNTER — Encounter (HOSPITAL_BASED_OUTPATIENT_CLINIC_OR_DEPARTMENT_OTHER): Payer: Self-pay | Admitting: Emergency Medicine

## 2020-07-03 ENCOUNTER — Emergency Department (HOSPITAL_BASED_OUTPATIENT_CLINIC_OR_DEPARTMENT_OTHER): Payer: Managed Care, Other (non HMO)

## 2020-07-03 ENCOUNTER — Other Ambulatory Visit: Payer: Self-pay

## 2020-07-03 DIAGNOSIS — I10 Essential (primary) hypertension: Secondary | ICD-10-CM | POA: Diagnosis not present

## 2020-07-03 DIAGNOSIS — D72829 Elevated white blood cell count, unspecified: Secondary | ICD-10-CM | POA: Diagnosis not present

## 2020-07-03 DIAGNOSIS — N132 Hydronephrosis with renal and ureteral calculous obstruction: Secondary | ICD-10-CM | POA: Insufficient documentation

## 2020-07-03 DIAGNOSIS — Z79899 Other long term (current) drug therapy: Secondary | ICD-10-CM | POA: Insufficient documentation

## 2020-07-03 DIAGNOSIS — I2510 Atherosclerotic heart disease of native coronary artery without angina pectoris: Secondary | ICD-10-CM | POA: Diagnosis not present

## 2020-07-03 DIAGNOSIS — R109 Unspecified abdominal pain: Secondary | ICD-10-CM | POA: Diagnosis present

## 2020-07-03 DIAGNOSIS — N201 Calculus of ureter: Secondary | ICD-10-CM

## 2020-07-03 LAB — CBC WITH DIFFERENTIAL/PLATELET
Abs Immature Granulocytes: 0.24 10*3/uL — ABNORMAL HIGH (ref 0.00–0.07)
Basophils Absolute: 0.1 10*3/uL (ref 0.0–0.1)
Basophils Relative: 0 %
Eosinophils Absolute: 0 10*3/uL (ref 0.0–0.5)
Eosinophils Relative: 0 %
HCT: 50.8 % (ref 39.0–52.0)
Hemoglobin: 16.3 g/dL (ref 13.0–17.0)
Immature Granulocytes: 1 %
Lymphocytes Relative: 8 %
Lymphs Abs: 1.5 10*3/uL (ref 0.7–4.0)
MCH: 29.4 pg (ref 26.0–34.0)
MCHC: 32.1 g/dL (ref 30.0–36.0)
MCV: 91.5 fL (ref 80.0–100.0)
Monocytes Absolute: 1.6 10*3/uL — ABNORMAL HIGH (ref 0.1–1.0)
Monocytes Relative: 9 %
Neutro Abs: 14.4 10*3/uL — ABNORMAL HIGH (ref 1.7–7.7)
Neutrophils Relative %: 82 %
Platelets: 227 10*3/uL (ref 150–400)
RBC: 5.55 MIL/uL (ref 4.22–5.81)
RDW: 13.7 % (ref 11.5–15.5)
WBC: 17.8 10*3/uL — ABNORMAL HIGH (ref 4.0–10.5)
nRBC: 0 % (ref 0.0–0.2)

## 2020-07-03 LAB — URINALYSIS, ROUTINE W REFLEX MICROSCOPIC
Bilirubin Urine: NEGATIVE
Glucose, UA: NEGATIVE mg/dL
Ketones, ur: NEGATIVE mg/dL
Leukocytes,Ua: NEGATIVE
Nitrite: NEGATIVE
Protein, ur: NEGATIVE mg/dL
Specific Gravity, Urine: 1.019 (ref 1.005–1.030)
pH: 6 (ref 5.0–8.0)

## 2020-07-03 LAB — BASIC METABOLIC PANEL
Anion gap: 11 (ref 5–15)
BUN: 23 mg/dL — ABNORMAL HIGH (ref 6–20)
CO2: 24 mmol/L (ref 22–32)
Calcium: 9.3 mg/dL (ref 8.9–10.3)
Chloride: 102 mmol/L (ref 98–111)
Creatinine, Ser: 1.2 mg/dL (ref 0.61–1.24)
GFR, Estimated: >60 mL/min (ref >60)
Glucose, Bld: 118 mg/dL — ABNORMAL HIGH (ref 70–99)
Potassium: 3.7 mmol/L (ref 3.5–5.1)
Sodium: 137 mmol/L (ref 135–145)

## 2020-07-03 MED ORDER — OXYCODONE-ACETAMINOPHEN 5-325 MG PO TABS: 1.0000 | ORAL_TABLET | Freq: Three times a day (TID) | ORAL | 0 refills | Status: DC | PRN

## 2020-07-03 MED ORDER — ONDANSETRON HCL 4 MG/2ML IJ SOLN
4.0000 mg | Freq: Once | INTRAMUSCULAR | Status: AC
  Administered 2020-07-03: 4 mg via INTRAVENOUS
  Filled 2020-07-03: qty 2

## 2020-07-03 MED ORDER — HYDROMORPHONE HCL 1 MG/ML IJ SOLN
0.5000 mg | Freq: Once | INTRAMUSCULAR | Status: AC
  Administered 2020-07-03: 0.5 mg via INTRAVENOUS
  Filled 2020-07-03: qty 1

## 2020-07-03 MED ORDER — ONDANSETRON 4 MG PO TBDP: 4.0000 mg | ORAL_TABLET | Freq: Three times a day (TID) | ORAL | 0 refills | Status: DC | PRN

## 2020-07-03 MED ORDER — KETOROLAC TROMETHAMINE 15 MG/ML IJ SOLN
15.0000 mg | Freq: Once | INTRAMUSCULAR | Status: AC
  Administered 2020-07-03: 15 mg via INTRAVENOUS
  Filled 2020-07-03: qty 1

## 2020-07-03 NOTE — ED Provider Notes (Signed)
[facility] EMERGENCY DEPT Provider Note   CSN: [id number] Arrival date & time: 07/03/20  1652     History Chief Complaint  Patient presents with   Flank Pain    Patrick Campbell is a 60 y.o. male.   Flank Pain Pertinent negatives include no chest pain and no shortness of breath.  Patient patient developed left flank pain.  Began earlier today.  Feels like previous kidney stones.  Some difficulty urinating.  No fevers.  No relief with oxycodone at home.  States he has had multiple stones before and knows what they feel like.  Some nausea without vomiting.     Past Medical History:  Diagnosis Date   Acquired renal cyst of left kidney 29/9371   Complicated cyst vs solid mass: contrast enhanced CT was recommended but ?apparently not done?   CAD (coronary artery disease)    1 vessel on coronary CT, Calcium score 69th %'tile for age, recommended inc statin and start ASA (04/2020)   Chronic renal insufficiency, stage II (mild)    Borderline stage II/III (CrCl estimate @ low 60s)   COVID-19 virus detected 09/2018   congrstion and low fever x 2 and 1/2 weeks all symptoms resolved   Epididymal mass    left   History of gallstones    Lap chole 12/2018   History of kidney stones    ongoing L nephrolithiasis as of 08/2019 urol f/u   Hyperlipidemia    Hypertension    Injury 04/01/2020   right foot injury dropped weight on foot bruised and swollen using ice prn skin intact   Polycythemia 05/10/2010   EPO level normal-- IDIOPATHIC   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   MRI pituitary ordered but never done   Wears glasses    for reading    Patient Active Problem List   Diagnosis Date Noted   Cholelithiasis with cholecystitis 01/09/2019   Urticaria 12/02/2017   Angioedema 12/02/2017   Seasonal allergic rhinitis 12/02/2017   Hypernatremia 09/04/2017   Rectal bleeding 04/30/2016   Hydronephrosis of left kidney 05/26/2015   Kidney stone 05/26/2015   Ureteral stone  05/26/2015   Left flank pain 05/25/2015   Other malaise and fatigue 08/07/2012   Health maintenance examination 07/15/2011   Chronic headaches 06/12/2010   Blurry vision 06/12/2010   Chronic renal insufficiency 06/12/2010   Polycythemia 05/10/2010   Hyperlipidemia 05/09/2010   Erectile dysfunction 05/09/2010   HTN (hypertension), benign 05/09/2010   Hypogonadism male     Past Surgical History:  Procedure Laterality Date   CHOLECYSTECTOMY N/A 01/10/2019   Procedure: LAPAROSCOPIC CHOLECYSTECTOMY;  Surgeon: Coralie Keens, MD;  Location: [facility];  Service: General;  Laterality: N/A;   COLONOSCOPY  09/30/11   Normal-repeat in 10 yrs   CYSTOSCOPY WITH STENT PLACEMENT Left 05/29/2015   Procedure: CYSTOSCOPY WITH STENT PLACEMENT, RETROGRADE;  Surgeon: Cleon Gustin, MD;  Location: [facility];  Service: Urology;  Laterality: Left;   CYSTOSCOPY/RETROGRADE/URETEROSCOPY/STONE EXTRACTION WITH BASKET Left 06/15/2015   Procedure: CYSTOSCOPY/RETROGRADE/URETEROSCOPY/STONE EXTRACTION WITH BASKET WITH STENT EXCHANGE;  Surgeon: Cleon Gustin, MD;  Location: [facility];  Service: Urology;  Laterality: Left;   HOLMIUM LASER APPLICATION Left 06/23/6787   Procedure: HOLMIUM LASER APPLICATION;  Surgeon: Cleon Gustin, MD;  Location: [facility];  Service: Urology;  Laterality: Left;   HYDROCELE EXCISION Left 04/10/2020   Procedure: HYDROCELECTOMY ADULT;  Surgeon: Franchot Gallo, MD;  Location: [facility];  Service: Urology;  Laterality: Left;   KNEE  SURGERY Right 2002   arthroscopic   SCROTAL EXPLORATION Left 04/10/2020   benign L epididymal mass.  Procedure: INGUINAL EXPLORATION AND EXCISION OF EPIDIDYMAL MASS;  Surgeon: Franchot Gallo, MD;  Location: [facility];  Service: Urology;  Laterality: Left;  1 HR       Family History  Problem Relation Age of Onset   Hypertension Mother    Food Allergy Mother    Cancer Father         lung/ smoker   Kidney disease Brother    HIV Brother     Social History   Tobacco Use   Smoking status: Never   Smokeless tobacco: Never  Vaping Use   Vaping Use: Never used  Substance Use Topics   Alcohol use: Yes    Comment: ovv   Drug use: No    Home Medications Prior to Admission medications   Medication Sig Start Date End Date Taking? Authorizing Provider  ondansetron (ZOFRAN-ODT) 4 MG disintegrating tablet Take 1 tablet (4 mg total) by mouth every 8 (eight) hours as needed for nausea or vomiting. 07/03/20  Yes Davonna Belling, MD  oxyCODONE-acetaminophen (PERCOCET/ROXICET) 5-325 MG tablet Take 1-2 tablets by mouth every 8 (eight) hours as needed for severe pain. 07/03/20  Yes Davonna Belling, MD  amLODipine (NORVASC) 10 MG tablet Take 1 tablet (10 mg total) by mouth daily. 07/15/11 05/24/20  McGowen, Adrian Blackwater, MD  atorvastatin (LIPITOR) 80 MG tablet Take 1 tablet (80 mg total) by mouth daily. 04/26/20   McGowen, Adrian Blackwater, MD    Allergies    Patient has no known allergies.  Review of Systems   Review of Systems  Constitutional:  Negative for appetite change.  HENT:  Negative for congestion.   Respiratory:  Negative for shortness of breath.   Cardiovascular:  Negative for chest pain.  Gastrointestinal:  Positive for nausea.  Genitourinary:  Positive for difficulty urinating and flank pain.  Musculoskeletal:  Negative for back pain.  Skin:  Negative for rash.  Neurological:  Negative for weakness.  Psychiatric/Behavioral:  Negative for confusion.    Physical Exam Updated Vital Signs BP (!) 147/92   Pulse 67   Temp 98.3 F (36.8 C) (Oral)   Resp 18   Ht 5\' 11"  (1.803 m)   Wt 97.5 kg   SpO2 96%   BMI 29.99 kg/m   Physical Exam Vitals and nursing note reviewed.  Constitutional:      Comments: Patient appears uncomfortable  HENT:     Head: Atraumatic.  Eyes:     Extraocular Movements: Extraocular movements intact.     Pupils: Pupils are equal, round, and  reactive to light.  Cardiovascular:     Rate and Rhythm: Normal rate.  Pulmonary:     Breath sounds: No wheezing or rhonchi.  Abdominal:     Tenderness: There is no abdominal tenderness.  Genitourinary:    Comments: CVA tenderness on right Musculoskeletal:     Cervical back: Neck supple.  Skin:    General: Skin is warm.     Capillary Refill: Capillary refill takes less than 2 seconds.  Neurological:     General: No focal deficit present.     Mental Status: He is alert and oriented to person, place, and time.    ED Results / Procedures / Treatments   Labs (all labs ordered are listed, but only abnormal results are displayed) Labs Reviewed  BASIC METABOLIC PANEL - Abnormal; Notable for the following components:  Result Value   Glucose, Bld 118 (*)    BUN 23 (*)    All other components within normal limits  CBC WITH DIFFERENTIAL/PLATELET - Abnormal; Notable for the following components:   WBC 17.8 (*)    Neutro Abs 14.4 (*)    Monocytes Absolute 1.6 (*)    Abs Immature Granulocytes 0.24 (*)    All other components within normal limits  URINALYSIS, ROUTINE W REFLEX MICROSCOPIC - Abnormal; Notable for the following components:   Color, Urine COLORLESS (*)    Hgb urine dipstick SMALL (*)    All other components within normal limits    EKG None  Radiology DG Abdomen 1 View  Result Date: 07/03/2020 CLINICAL DATA:  Left-sided flank pain. EXAM: ABDOMEN - 1 VIEW COMPARISON:  05/29/2015.  CT, 08/30/2019. FINDINGS: Round stone adjacent to post cholecystectomy clips the right upper quadrant reflects a residual gallstone which was present on prior CT. Subtle densities project over the left kidney, left kidney shadow mostly obscured by overlying bowel gas and stool. These densities are consistent with intrarenal stones, which were noted on the prior CT. No convincing right intrarenal stone and no convincing ureteral stone. Normal bowel gas pattern. No acute/significant skeletal  abnormality. IMPRESSION: 1. No acute finding.  No convincing ureteral stone. 2. Subtly visualized left intrarenal stones. Exam limited by bowel gas and stool. Electronically Signed   By: Lajean Manes M.D.   On: 07/03/2020 18:14   US Renal  Result Date: 07/03/2020 CLINICAL DATA:  Left flank pain EXAM: RENAL / URINARY TRACT ULTRASOUND COMPLETE COMPARISON:  Radiograph 07/03/2020, CT 08/30/2019 FINDINGS: Right Kidney: Renal measurements: 12.2 x 5.7 x 7.2 cm = volume: 264 mL. Echogenicity within normal limits. No hydronephrosis. Cyst at the lower pole measuring 11 x 8 x 9 mm Left Kidney: Renal measurements: 12.2 x 6.8 x 5.1 cm = volume: 220.7 mL. Echogenicity within normal limits. No mass. Suspect multiple small stones measuring up to 7 mm. Mild to moderate left hydronephrosis. Bladder: Appears normal for degree of bladder distention. Other: None. IMPRESSION: 1. Mild to moderate left hydronephrosis.  Consider follow-up CT KUB 2. Suspect multiple small stones in the left kidney Electronically Signed   By: Donavan Foil M.D.   On: 07/03/2020 19:26    Procedures Procedures   Medications Ordered in ED Medications  ondansetron (ZOFRAN) injection 4 mg (4 mg Intravenous Given 07/03/20 1719)  HYDROmorphone (DILAUDID) injection 0.5 mg (0.5 mg Intravenous Given 07/03/20 1718)  ketorolac (TORADOL) 15 MG/ML injection 15 mg (15 mg Intravenous Given 07/03/20 1957)    ED Course  I have reviewed the triage vital signs and the nursing notes.  Pertinent labs & imaging results that were available during my care of the patient were reviewed by me and considered in my medical decision making (see chart for details).    MDM Rules/Calculators/A&P                          Patient with left flank.  History of kidney stones.  States this pain is the same.  Has some hematuria.  Ultrasound showed hydronephrosis.  Pain improved with treatment.  I think this is likely a recurrent stone on him.  Does not have infection on the  urine although does have a leukocytosis.  With pain improved I feels the patient to be discharged home although instructions given about short-term follow-up with that leukocytosis there would be some worry for infection.  We will follow-up  with his urologist.  Discharge home Final Clinical Impression(s) / ED Diagnoses Final diagnoses:  Ureteral stone  Leukocytosis, unspecified type    Rx / DC Orders ED Discharge Orders          Ordered    ondansetron (ZOFRAN-ODT) 4 MG disintegrating tablet  Every 8 hours PRN        07/03/20 1951    oxyCODONE-acetaminophen (PERCOCET/ROXICET) 5-325 MG tablet  Every 8 hours PRN        07/03/20 1951             Davonna Belling, MD 07/03/20 2300

## 2020-07-03 NOTE — Discharge Instructions (Addendum)
Your white count is elevated but urine does not show signs of infection.  If you develop fever chills or signs of infection you will need to be seen as soon as possible.  Follow-up with your urologist.  Return if other symptoms develop.

## 2020-07-03 NOTE — ED Triage Notes (Signed)
Left sided flank pain and feels  like he cannot  get out urine well , feels nauseated  hx of stones

## 2020-12-01 ENCOUNTER — Ambulatory Visit: Payer: Managed Care, Other (non HMO) | Admitting: Family Medicine

## 2020-12-01 NOTE — Progress Notes (Deleted)
OFFICE VISIT  12/01/2020  CC: No chief complaint on file.   HPI:    Patient is a 60 y.o. male who presents for "pain under rib cage".  HPI: ***  Past Medical History:  Diagnosis Date   Acquired renal cyst of left kidney 34/1937   Complicated cyst vs solid mass: contrast enhanced CT was recommended but ?apparently not done?   CAD (coronary artery disease)    1 vessel on coronary CT, Calcium score 69th %'tile for age, recommended inc statin and start ASA (04/2020)   Chronic renal insufficiency, stage II (mild)    Borderline stage II/III (CrCl estimate @ low 60s)   COVID-19 virus detected 09/2018   congrstion and low fever x 2 and 1/2 weeks all symptoms resolved   Epididymal mass    left   History of gallstones    Lap chole 12/2018   History of kidney stones    ongoing L nephrolithiasis as of 08/2019 urol f/u   Hyperlipidemia    Hypertension    Injury 04/01/2020   right foot injury dropped weight on foot bruised and swollen using ice prn skin intact   Polycythemia 05/10/2010   EPO level normal-- IDIOPATHIC   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   MRI pituitary ordered but never done   Wears glasses    for reading    Past Surgical History:  Procedure Laterality Date   CHOLECYSTECTOMY N/A 01/10/2019   Procedure: LAPAROSCOPIC CHOLECYSTECTOMY;  Surgeon: Coralie Keens, MD;  Location: [facility];  Service: General;  Laterality: N/A;   COLONOSCOPY  09/30/11   Normal-repeat in 10 yrs   CYSTOSCOPY WITH STENT PLACEMENT Left 05/29/2015   Procedure: CYSTOSCOPY WITH STENT PLACEMENT, RETROGRADE;  Surgeon: Cleon Gustin, MD;  Location: [facility];  Service: Urology;  Laterality: Left;   CYSTOSCOPY/RETROGRADE/URETEROSCOPY/STONE EXTRACTION WITH BASKET Left 06/15/2015   Procedure: CYSTOSCOPY/RETROGRADE/URETEROSCOPY/STONE EXTRACTION WITH BASKET WITH STENT EXCHANGE;  Surgeon: Cleon Gustin, MD;  Location: [facility];  Service: Urology;  Laterality: Left;   HOLMIUM LASER  APPLICATION Left 9/0/2409   Procedure: HOLMIUM LASER APPLICATION;  Surgeon: Cleon Gustin, MD;  Location: [facility];  Service: Urology;  Laterality: Left;   HYDROCELE EXCISION Left 04/10/2020   Procedure: HYDROCELECTOMY ADULT;  Surgeon: Franchot Gallo, MD;  Location: [facility];  Service: Urology;  Laterality: Left;   KNEE SURGERY Right 2002   arthroscopic   SCROTAL EXPLORATION Left 04/10/2020   benign L epididymal mass.  Procedure: INGUINAL EXPLORATION AND EXCISION OF EPIDIDYMAL MASS;  Surgeon: Franchot Gallo, MD;  Location: [facility];  Service: Urology;  Laterality: Left;  1 HR    Outpatient Medications Prior to Visit  Medication Sig Dispense Refill   amLODipine (NORVASC) 10 MG tablet Take 1 tablet (10 mg total) by mouth daily. 90 tablet 2   atorvastatin (LIPITOR) 80 MG tablet Take 1 tablet (80 mg total) by mouth daily. 90 tablet 3   ondansetron (ZOFRAN-ODT) 4 MG disintegrating tablet Take 1 tablet (4 mg total) by mouth every 8 (eight) hours as needed for nausea or vomiting. 8 tablet 0   oxyCODONE-acetaminophen (PERCOCET/ROXICET) 5-325 MG tablet Take 1-2 tablets by mouth every 8 (eight) hours as needed for severe pain. 10 tablet 0   No facility-administered medications prior to visit.    No Known Allergies  ROS As per HPI  PE: Vitals with BMI 07/03/2020 07/03/2020 07/03/2020  Height - - -  Weight - - -  BMI - - -  Systolic 681 594 707  Diastolic 92 93 87  Pulse 67 70 83     ***  LABS:  ***  IMPRESSION AND PLAN:  No problem-specific Assessment & Plan notes found for this encounter.   An After Visit Summary was printed and given to the patient.  FOLLOW UP: No follow-ups on file.  Signed:  Crissie Sickles, MD           12/01/2020

## 2021-01-23 ENCOUNTER — Encounter: Payer: Self-pay | Admitting: Family Medicine

## 2021-01-23 ENCOUNTER — Other Ambulatory Visit: Payer: Self-pay

## 2021-01-23 ENCOUNTER — Ambulatory Visit: Payer: Managed Care, Other (non HMO) | Admitting: Family Medicine

## 2021-01-23 VITALS — BP 130/90 | HR 83 | Temp 98.0°F | Ht 71.0 in | Wt 221.2 lb

## 2021-01-23 DIAGNOSIS — M545 Low back pain, unspecified: Secondary | ICD-10-CM

## 2021-01-23 DIAGNOSIS — Z87442 Personal history of urinary calculi: Secondary | ICD-10-CM

## 2021-01-23 DIAGNOSIS — R3129 Other microscopic hematuria: Secondary | ICD-10-CM | POA: Diagnosis not present

## 2021-01-23 DIAGNOSIS — I10 Essential (primary) hypertension: Secondary | ICD-10-CM

## 2021-01-23 DIAGNOSIS — M5442 Lumbago with sciatica, left side: Secondary | ICD-10-CM | POA: Diagnosis not present

## 2021-01-23 LAB — POCT URINALYSIS DIPSTICK
Bilirubin, UA: NEGATIVE
Blood, UA: POSITIVE
Glucose, UA: NEGATIVE
Ketones, UA: NEGATIVE
Leukocytes, UA: NEGATIVE
Nitrite, UA: NEGATIVE
Protein, UA: POSITIVE — AB
Spec Grav, UA: 1.02 (ref 1.010–1.025)
Urobilinogen, UA: 0.2 E.U./dL
pH, UA: 6 (ref 5.0–8.0)

## 2021-01-23 NOTE — Progress Notes (Signed)
OFFICE VISIT  01/23/2021  CC:  Chief Complaint  Patient presents with   Low back pain    X3-4 days and radiating to left leg. Denies any issues urinating. Pt had urology appt with Dr.Dahlstedt last week and x-ray completed. Dr didn't see kidney stone but noticed blood in UA dip and started on Tamsulosin.   HPI:    Patient is a 61 y.o. male who presents for low back pain.  HPI: Onset about a week ago of pain across the entire low back, intermittently extending into the left buttock left groin, and left lateral thigh.  Some numbness in the same distribution of the leg intermittently.  Nothing seems to make it better or worse.  He recalls no specific strain but did work out upper body a couple days in a row somewhere around the time his back pain began.  No leg weakness.  No loss of bowel or bladder control.  No saddle anesthesia.   His pain was the worst this morning, felt like he needed to take a pain pill but did not. He has tried some ibuprofen as well as some muscle relaxer that he had at home, he did not recall the name of it, says these helped a mild amount temporarily.  Of note, patient with history of kidney stones: About a week ago he presented to his urologist with left flank pain.  He did have microscopic hematuria at that time.  Plain films did not show a stone per patient report.  He was started on Flomax at that time.  His left flank pain is resolved.  However, he does not feel like he passed a stone.  Additionally, on July 03, 2020 he presented to the ED with left flank pain.  Microscopic blood on UA.  Symptoms same as his prior renal stone episodes.  He was found to have mild to moderate left hydronephrosis on renal ultrasound but no convincing ureteral stone.  He had some small intrarenal stones.  It was felt that his symptoms were consistent with a passage of a small stone and he was discharged home with medical therapy and instructed to follow-up with his urologist.  Dema Severin blood  cell count 17K at that time and serum creatinine 1.2, which is his baseline.  ROS as above, plus--> No polyuria or polydipsia.  No myalgias or arthralgias.  No focal weakness, paresthesias, or tremors.  No acute vision or hearing abnormalities.  No dysuria or unusual/new urinary urgency or frequency.  No gross hematuria.  No recent changes in lower legs. No n/v/d or abd pain.  No palpitations.    Past Medical History:  Diagnosis Date   Acquired renal cyst of left kidney 91/4782   Complicated cyst vs solid mass: contrast enhanced CT was recommended but ?apparently not done?   CAD (coronary artery disease)    1 vessel on coronary CT, Calcium score 69th %'tile for age, recommended inc statin and start ASA (04/2020)   Chronic renal insufficiency, stage II (mild)    Borderline stage II/III (CrCl estimate @ low 60s)   COVID-19 virus detected 09/2018   congrstion and low fever x 2 and 1/2 weeks all symptoms resolved   Epididymal mass    left   History of gallstones    Lap chole 12/2018   History of kidney stones    ongoing L nephrolithiasis as of 08/2019 urol f/u   Hyperlipidemia    Hypertension    Injury 04/01/2020   right foot injury dropped weight  on foot bruised and swollen using ice prn skin intact   Polycythemia 05/10/2010   EPO level normal-- IDIOPATHIC   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   MRI pituitary ordered but never done   Wears glasses    for reading    Past Surgical History:  Procedure Laterality Date   CHOLECYSTECTOMY N/A 01/10/2019   Procedure: LAPAROSCOPIC CHOLECYSTECTOMY;  Surgeon: Coralie Keens, MD;  Location: [facility];  Service: General;  Laterality: N/A;   COLONOSCOPY  09/30/11   Normal-repeat in 10 yrs   [place] Left 05/29/2015   Procedure: CYSTOSCOPY WITH STENT PLACEMENT, RETROGRADE;  Surgeon: Cleon Gustin, MD;  Location: [facility];  Service: Urology;  Laterality: Left;   CYSTOSCOPY/RETROGRADE/URETEROSCOPY/STONE EXTRACTION WITH  BASKET Left 06/15/2015   Procedure: CYSTOSCOPY/RETROGRADE/URETEROSCOPY/STONE EXTRACTION WITH BASKET WITH STENT EXCHANGE;  Surgeon: Cleon Gustin, MD;  Location: [facility];  Service: Urology;  Laterality: Left;   HOLMIUM LASER APPLICATION Left 03/17/74   Procedure: HOLMIUM LASER APPLICATION;  Surgeon: Cleon Gustin, MD;  Location: [facility];  Service: Urology;  Laterality: Left;   HYDROCELE EXCISION Left 04/10/2020   Procedure: HYDROCELECTOMY ADULT;  Surgeon: Franchot Gallo, MD;  Location: [facility];  Service: Urology;  Laterality: Left;   KNEE SURGERY Right 2002   arthroscopic   SCROTAL EXPLORATION Left 04/10/2020   benign L epididymal mass.  Procedure: INGUINAL EXPLORATION AND EXCISION OF EPIDIDYMAL MASS;  Surgeon: Franchot Gallo, MD;  Location: [facility];  Service: Urology;  Laterality: Left;  1 HR    Outpatient Medications Prior to Visit  Medication Sig Dispense Refill   amLODipine (NORVASC) 10 MG tablet Take 1 tablet (10 mg total) by mouth daily. 90 tablet 2   atorvastatin (LIPITOR) 80 MG tablet Take 1 tablet (80 mg total) by mouth daily. 90 tablet 3   tamsulosin (FLOMAX) 0.4 MG CAPS capsule Take 0.4 mg by mouth daily.     ondansetron (ZOFRAN-ODT) 4 MG disintegrating tablet Take 1 tablet (4 mg total) by mouth every 8 (eight) hours as needed for nausea or vomiting. (Patient not taking: Reported on 01/23/2021) 8 tablet 0   oxyCODONE-acetaminophen (PERCOCET/ROXICET) 5-325 MG tablet Take 1-2 tablets by mouth every 8 (eight) hours as needed for severe pain. (Patient not taking: Reported on 01/23/2021) 10 tablet 0   No facility-administered medications prior to visit.    No Known Allergies  ROS As per HPI  PE: Vitals with BMI 01/23/2021 01/23/2021 07/03/2020  Height - 5\' 11"  -  Weight - 221 lbs 3 oz -  BMI - 22.63 -  Systolic 335 456 256  Diastolic 90 96 92  Pulse - 83 67   Physical Exam  General: Alert  and well-appearing, oriented x4. Range of motion of L-spine is fully intact except he is limited some and extension of the L-spine and this induces/reproduces his pain. No tenderness of SI joints or anywhere on the lumbar spine.  No flank tenderness or CVA tenderness.  Legs strength 5 out of 5 proximally and distally bilaterally.  No sensory deficit.  DTRs 2+ patellar bilaterally, 1+ Achilles bilaterally  LABS:  Last CBC Lab Results  Component Value Date   WBC 17.8 (H) 07/03/2020   HGB 16.3 07/03/2020   HCT 50.8 07/03/2020   MCV 91.5 07/03/2020   MCH 29.4 07/03/2020   RDW 13.7 07/03/2020   PLT 227 38/93/7342   Last metabolic panel Lab Results  Component Value Date   GLUCOSE 118 (H) 07/03/2020  NA 137 07/03/2020   K 3.7 07/03/2020   CL 102 07/03/2020   CO2 24 07/03/2020   BUN 23 (H) 07/03/2020   CREATININE 1.20 07/03/2020   GFRNONAA >60 07/03/2020   CALCIUM 9.3 07/03/2020   PROT 6.9 04/05/2020   ALBUMIN 4.6 04/05/2020   BILITOT 0.5 04/05/2020   ALKPHOS 66 04/05/2020   AST 33 06/22/2020   ALT 64 (H) 06/22/2020   ANIONGAP 11 07/03/2020   Last lipids Lab Results  Component Value Date   CHOL 147 06/22/2020   HDL 45.20 06/22/2020   LDLCALC 80 06/22/2020   TRIG 106.0 06/22/2020   CHOLHDL 3 06/22/2020   Last hemoglobin A1c Lab Results  Component Value Date   HGBA1C 5.5 09/01/2017   Last thyroid functions Lab Results  Component Value Date   TSH 1.93 04/05/2020   UA: 3+ blood, otherwise normal.  IMPRESSION AND PLAN:  1 acute low back pain with left-sided radiculopathy symptoms. Reassured patient that I do not think his current back pain is related to his history of kidney stones.  We will check lumbar plain films and initiate referral to physical therapy. Recommended naproxen 440 twice daily x7 days. He declined prescription pain medicine at this time, does have oxycodone at home tablets prescribed for as needed kidney stone.  #2 nephrolithiasis.  Most recent 2  episodes of symptoms that were consistent with kidney stone did not have any correlating findings on imaging.  Started on Flomax by urologist a week ago.  Currently has no symptoms of any obstructing stone.  Has some persistent microhematuria that I would expect to clear soon--if microhematuria still present at follow-up in 1 month then will proceed with CT hematuria protocol (if urologist has not already done this imaging or cystoscopy).  No signs of UTI.  #3 elevated blood pressure, carries diagnosis of hypertension. In the context of acute pain I do not think we need to do anything about this at this time. Encouraged him to occasionally check a home blood pressure, goal less than 130/80.  Continue amlodipine 10 mg a day.  An After Visit Summary was printed and given to the patient.  FOLLOW UP: Return in about 4 weeks (around 02/20/2021) for f/u back pain.  Signed:  Crissie Sickles, MD           01/23/2021

## 2021-02-20 ENCOUNTER — Ambulatory Visit: Payer: Managed Care, Other (non HMO) | Admitting: Family Medicine

## 2021-02-20 NOTE — Progress Notes (Deleted)
OFFICE VISIT  02/20/2021  CC: No chief complaint on file.   HPI:    Patient is a 61 y.o. male who presents for 1 mo f/u back pain. A/P as of last visit: "1 acute low back pain with left-sided radiculopathy symptoms. Reassured patient that I do not think his current back pain is related to his history of kidney stones.  We will check lumbar plain films and initiate referral to physical therapy. Recommended naproxen 440 twice daily x7 days. He declined prescription pain medicine at this time, does have oxycodone at home tablets prescribed for as needed kidney stone.   #2 nephrolithiasis.  Most recent 2 episodes of symptoms that were consistent with kidney stone did not have any correlating findings on imaging.  Started on Flomax by urologist a week ago.  Currently has no symptoms of any obstructing stone.  Has some persistent microhematuria that I would expect to clear soon--if microhematuria still present at follow-up in 1 month then will proceed with CT hematuria protocol (if urologist has not already done this imaging or cystoscopy).  No signs of UTI.   #3 elevated blood pressure, carries diagnosis of hypertension. In the context of acute pain I do not think we need to do anything about this at this time. Encouraged him to occasionally check a home blood pressure, goal less than 130/80.  Continue amlodipine 10 mg a day"  INTERIM HX: Patient did not get the lumbar spine x-rays I ordered last visit. ***  Past Medical History:  Diagnosis Date   Acquired renal cyst of left kidney 86/5784   Complicated cyst vs solid mass: contrast enhanced CT was recommended but ?apparently not done?   CAD (coronary artery disease)    1 vessel on coronary CT, Calcium score 69th %'tile for age, recommended inc statin and start ASA (04/2020)   Chronic renal insufficiency, stage II (mild)    Borderline stage II/III (CrCl estimate @ low 60s)   COVID-19 virus detected 09/2018   congrstion and low fever x 2 and  1/2 weeks all symptoms resolved   Epididymal mass    left   History of gallstones    Lap chole 12/2018   History of kidney stones    ongoing L nephrolithiasis as of 08/2019 urol f/u   Hyperlipidemia    Hypertension    Injury 04/01/2020   right foot injury dropped weight on foot bruised and swollen using ice prn skin intact   Polycythemia 05/10/2010   EPO level normal-- IDIOPATHIC   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   MRI pituitary ordered but never done   Wears glasses    for reading    Past Surgical History:  Procedure Laterality Date   CHOLECYSTECTOMY N/A 01/10/2019   Procedure: LAPAROSCOPIC CHOLECYSTECTOMY;  Surgeon: Coralie Keens, MD;  Location: [facility];  Service: General;  Laterality: N/A;   COLONOSCOPY  09/30/11   Normal-repeat in 10 yrs   CYSTOSCOPY WITH STENT PLACEMENT Left 05/29/2015   Procedure: CYSTOSCOPY WITH STENT PLACEMENT, RETROGRADE;  Surgeon: Cleon Gustin, MD;  Location: [facility];  Service: Urology;  Laterality: Left;   CYSTOSCOPY/RETROGRADE/URETEROSCOPY/STONE EXTRACTION WITH BASKET Left 06/15/2015   Procedure: CYSTOSCOPY/RETROGRADE/URETEROSCOPY/STONE EXTRACTION WITH BASKET WITH STENT EXCHANGE;  Surgeon: Cleon Gustin, MD;  Location[facility];  Service: Urology;  Laterality: Left;   HOLMIUM LASER APPLICATION Left 06/22/6293   Procedure: HOLMIUM LASER APPLICATION;  Surgeon: Cleon Gustin, MD;  Location: [facility];  Service: Urology;  Laterality: Left;   HYDROCELE  EXCISION Left 04/10/2020   Procedure: HYDROCELECTOMY ADULT;  Surgeon: Franchot Gallo, MD;  Location: [facility];  Service: Urology;  Laterality: Left;   KNEE SURGERY Right 2002   arthroscopic   SCROTAL EXPLORATION Left 04/10/2020   benign L epididymal mass.  Procedure: INGUINAL EXPLORATION AND EXCISION OF EPIDIDYMAL MASS;  Surgeon: Franchot Gallo, MD;  Location: [facility];  Service: Urology;  Laterality: Left;  1 HR     Outpatient Medications Prior to Visit  Medication Sig Dispense Refill   amLODipine (NORVASC) 10 MG tablet Take 1 tablet (10 mg total) by mouth daily. 90 tablet 2   atorvastatin (LIPITOR) 80 MG tablet Take 1 tablet (80 mg total) by mouth daily. 90 tablet 3   tamsulosin (FLOMAX) 0.4 MG CAPS capsule Take 0.4 mg by mouth daily.     No facility-administered medications prior to visit.    No Known Allergies  ROS As per HPI  PE: Vitals with BMI 01/23/2021 01/23/2021 07/03/2020  Height - 5\' 11"  -  Weight - 221 lbs 3 oz -  BMI - 00.86 -  Systolic 761 950 932  Diastolic 90 96 92  Pulse - 83 67     Physical Exam  ***  LABS:  Last CBC Lab Results  Component Value Date   WBC 17.8 (H) 07/03/2020   HGB 16.3 07/03/2020   HCT 50.8 07/03/2020   MCV 91.5 07/03/2020   MCH 29.4 07/03/2020   RDW 13.7 07/03/2020   PLT 227 67/12/4578   Last metabolic panel Lab Results  Component Value Date   GLUCOSE 118 (H) 07/03/2020   NA 137 07/03/2020   K 3.7 07/03/2020   CL 102 07/03/2020   CO2 24 07/03/2020   BUN 23 (H) 07/03/2020   CREATININE 1.20 07/03/2020   GFRNONAA >60 07/03/2020   CALCIUM 9.3 07/03/2020   PROT 6.9 04/05/2020   ALBUMIN 4.6 04/05/2020   BILITOT 0.5 04/05/2020   ALKPHOS 66 04/05/2020   AST 33 06/22/2020   ALT 64 (H) 06/22/2020   ANIONGAP 11 07/03/2020   Lab Results  Component Value Date   HGBA1C 5.5 09/01/2017   IMPRESSION AND PLAN:  No problem-specific Assessment & Plan notes found for this encounter.   An After Visit Summary was printed and given to the patient.  FOLLOW UP: No follow-ups on file.  Signed:  Crissie Sickles, MD           02/20/2021

## 2021-02-21 ENCOUNTER — Telehealth: Payer: Self-pay

## 2021-02-21 NOTE — Telephone Encounter (Signed)
Noted  

## 2021-02-21 NOTE — Telephone Encounter (Signed)
FYI. Please see below regarding yesterday's appt.    [place] [facility] - Client Nonclinical Telephone Record  [facility] Client [place] [facility] - Client Client Site [place] - Day Provider AA - PHYSICIAN, Verita Schneiders- MD Contact Type Call Who Is Calling Patient / Member / Family / Caregiver Caller Name [name] Caller Phone Number [phone] Patient Name Patrick Campbell Patient DOB 1960-07-18 Call Type Message Only Information Provided Reason for Call Request to Reschedule Office Appointment Initial Comment Caller thinks he has a virus and needs to reschedule. Disp. Time Disposition Final User 02/20/2021 7:23:10 AM General Information Provided Yes Linda Hedges, Leilani Call Closed By: Tula Nakayama Buccino Transaction Date/Time: 02/20/2021 7:20:35 AM (ET)

## 2021-05-08 ENCOUNTER — Other Ambulatory Visit: Payer: Self-pay | Admitting: Family Medicine

## 2021-06-03 ENCOUNTER — Other Ambulatory Visit: Payer: Self-pay | Admitting: Family Medicine

## 2021-07-16 ENCOUNTER — Other Ambulatory Visit: Payer: Self-pay | Admitting: Family Medicine

## 2021-09-27 ENCOUNTER — Other Ambulatory Visit: Payer: Self-pay | Admitting: Urology

## 2021-09-27 ENCOUNTER — Telehealth: Payer: Self-pay | Admitting: Family Medicine

## 2021-09-27 ENCOUNTER — Encounter: Payer: Managed Care, Other (non HMO) | Admitting: Family Medicine

## 2021-09-27 DIAGNOSIS — N281 Cyst of kidney, acquired: Secondary | ICD-10-CM

## 2021-09-27 NOTE — Telephone Encounter (Signed)
Provider verbally made aware and request patient to be removed from the schedule.

## 2021-09-27 NOTE — Telephone Encounter (Signed)
Patient removed from provider schedule

## 2021-09-27 NOTE — Progress Notes (Deleted)
Office Note 09/27/2021  CC: No chief complaint on file.   HPI:  Patient is a 61 y.o. male who is here for annual health maintenance exam and follow-up hypertension and hyperlipidemia..  Past Medical History:  Diagnosis Date   Acquired renal cyst of left kidney 37/6283   Complicated cyst vs solid mass: contrast enhanced CT was recommended but ?apparently not done?   CAD (coronary artery disease)    1 vessel on coronary CT, Calcium score 69th %'tile for age, recommended inc statin and start ASA (04/2020)   Chronic renal insufficiency, stage II (mild)    Borderline stage II/III (CrCl estimate @ low 60s)   COVID-19 virus detected 09/2018   congrstion and low fever x 2 and 1/2 weeks all symptoms resolved   Epididymal mass    left   History of gallstones    Lap chole 12/2018   History of kidney stones    ongoing L nephrolithiasis as of 08/2019 urol f/u   Hyperlipidemia    Hypertension    Injury 04/01/2020   right foot injury dropped weight on foot bruised and swollen using ice prn skin intact   Polycythemia 05/10/2010   EPO level normal-- IDIOPATHIC   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   MRI pituitary ordered but never done   Wears glasses    for reading    Past Surgical History:  Procedure Laterality Date   CHOLECYSTECTOMY N/A 01/10/2019   Procedure: LAPAROSCOPIC CHOLECYSTECTOMY;  Surgeon: Coralie Keens, MD;  Location: [facility];  Service: General;  Laterality: N/A;   COLONOSCOPY  09/30/11   Normal-repeat in 10 yrs   CYSTOSCOPY WITH STENT PLACEMENT Left 05/29/2015   Procedure: CYSTOSCOPY WITH STENT PLACEMENT, RETROGRADE;  Surgeon: Cleon Gustin, MD;  Location: [facility];  Service: Urology;  Laterality: Left;   CYSTOSCOPY/RETROGRADE/URETEROSCOPY/STONE EXTRACTION WITH BASKET Left 06/15/2015   Procedure: CYSTOSCOPY/RETROGRADE/URETEROSCOPY/STONE EXTRACTION WITH BASKET WITH STENT EXCHANGE;  Surgeon: Cleon Gustin, MD;  Location[facility];  Service: Urology;   Laterality: Left;   HOLMIUM LASER APPLICATION Left 01/19/1759   Procedure: HOLMIUM LASER APPLICATION;  Surgeon: Cleon Gustin, MD;  Location: [facility];  Service: Urology;  Laterality: Left;   HYDROCELE EXCISION Left 04/10/2020   Procedure: HYDROCELECTOMY ADULT;  Surgeon: Franchot Gallo, MD;  Location: [facility];  Service: Urology;  Laterality: Left;   KNEE SURGERY Right 2002   arthroscopic   SCROTAL EXPLORATION Left 04/10/2020   benign L epididymal mass.  Procedure: INGUINAL EXPLORATION AND EXCISION OF EPIDIDYMAL MASS;  Surgeon: Franchot Gallo, MD;  Location: [facility];  Service: Urology;  Laterality: Left;  1 HR    Family History  Problem Relation Age of Onset   Hypertension Mother    Food Allergy Mother    Cancer Father        lung/ smoker   Kidney disease Brother    HIV Brother     Social History   Socioeconomic History   Marital status: Married    Spouse name: Not on file   Number of children: Not on file   Years of education: Not on file   Highest education level: Not on file  Occupational History   Not on file  Tobacco Use   Smoking status: Never   Smokeless tobacco: Never  Vaping Use   Vaping Use: Never used  Substance and Sexual Activity   Alcohol use: Yes    Comment: ovv   Drug use: No   Sexual activity: Yes  Partners: Female  Other Topics Concern   Not on file  Social History Narrative   Married, 3 children (39, 76, 21 yrs).  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   From [place].  Was in [employer] x 10 yrs in [place], [place].   Works out and plays sports regularly.  Rare wine intake.  No cigs or drugs.   Social Determinants of Health   Financial Resource Strain: Not on file  Food Insecurity: Not on file  Transportation Needs: Not on file  Physical Activity: Not on file  Stress: Not on file  Social Connections: Not on file  Intimate Partner Violence: Not on file    Outpatient Medications  Prior to Visit  Medication Sig Dispense Refill   amLODipine (NORVASC) 10 MG tablet Take 1 tablet (10 mg total) by mouth daily. 90 tablet 2   atorvastatin (LIPITOR) 80 MG tablet TAKE 1 TABLET BY MOUTH EVERY DAY 30 tablet 0   tamsulosin (FLOMAX) 0.4 MG CAPS capsule Take 0.4 mg by mouth daily.     No facility-administered medications prior to visit.    No Known Allergies  ROS *** PE;    01/23/2021   11:44 AM 01/23/2021   11:26 AM 07/03/2020    8:00 PM  Vitals with BMI  Height  '5\' 11"'$    Weight  221 lbs 3 oz   BMI  40.98   Systolic 119 147 829  Diastolic 90 96 92  Pulse  83 67   ***  Pertinent labs:  Lab Results  Component Value Date   TSH 1.93 04/05/2020   Lab Results  Component Value Date   WBC 17.8 (H) 07/03/2020   HGB 16.3 07/03/2020   HCT 50.8 07/03/2020   MCV 91.5 07/03/2020   PLT 227 07/03/2020   Lab Results  Component Value Date   CREATININE 1.20 07/03/2020   BUN 23 (H) 07/03/2020   NA 137 07/03/2020   K 3.7 07/03/2020   CL 102 07/03/2020   CO2 24 07/03/2020   Lab Results  Component Value Date   ALT 64 (H) 06/22/2020   AST 33 06/22/2020   ALKPHOS 66 04/05/2020   BILITOT 0.5 04/05/2020   Lab Results  Component Value Date   CHOL 147 06/22/2020   Lab Results  Component Value Date   HDL 45.20 06/22/2020   Lab Results  Component Value Date   LDLCALC 80 06/22/2020   Lab Results  Component Value Date   TRIG 106.0 06/22/2020   Lab Results  Component Value Date   CHOLHDL 3 06/22/2020   Lab Results  Component Value Date   PSA 2.38 04/05/2020   PSA 2.2 09/01/2017   PSA 0.75 07/22/2011   Lab Results  Component Value Date   HGBA1C 5.5 09/01/2017   ASSESSMENT AND PLAN:   No problem-specific Assessment & Plan notes found for this encounter.  Health maintenance exam: Reviewed age and gender appropriate health maintenance issues (prudent diet, regular exercise, health risks of tobacco and excessive alcohol, use of seatbelts, fire alarms in  home, use of sunscreen).  Also reviewed age and gender appropriate health screening as well as vaccine recommendations. Vaccines: Tdap due->***  Shingrix->***.  Flu->***.   Labs: HP labs + PSA ordered. Prostate ca screening: PSA ordered. Colon ca screening: normal colonoscopy 2013-->recall 09/2021  An After Visit Summary was printed and given to the patient.  FOLLOW UP:  No follow-ups on file.  Signed:  Crissie Sickles, MD  09/27/2021  

## 2021-09-27 NOTE — Telephone Encounter (Signed)
Pt  will no show today, however he called and rescheduled to 09/18 due to a kidney stone he has had a couple of days now.

## 2021-10-01 ENCOUNTER — Ambulatory Visit (INDEPENDENT_AMBULATORY_CARE_PROVIDER_SITE_OTHER): Payer: Managed Care, Other (non HMO) | Admitting: Family Medicine

## 2021-10-01 ENCOUNTER — Encounter: Payer: Self-pay | Admitting: Family Medicine

## 2021-10-01 VITALS — BP 140/84 | HR 69 | Temp 97.4°F | Ht 72.25 in | Wt 215.8 lb

## 2021-10-01 DIAGNOSIS — E7800 Pure hypercholesterolemia, unspecified: Secondary | ICD-10-CM | POA: Diagnosis not present

## 2021-10-01 DIAGNOSIS — Z125 Encounter for screening for malignant neoplasm of prostate: Secondary | ICD-10-CM

## 2021-10-01 DIAGNOSIS — R931 Abnormal findings on diagnostic imaging of heart and coronary circulation: Secondary | ICD-10-CM

## 2021-10-01 DIAGNOSIS — I10 Essential (primary) hypertension: Secondary | ICD-10-CM

## 2021-10-01 DIAGNOSIS — N182 Chronic kidney disease, stage 2 (mild): Secondary | ICD-10-CM | POA: Diagnosis not present

## 2021-10-01 DIAGNOSIS — Z0000 Encounter for general adult medical examination without abnormal findings: Secondary | ICD-10-CM

## 2021-10-01 MED ORDER — LISINOPRIL 10 MG PO TABS: 10.0000 mg | ORAL_TABLET | Freq: Every day | ORAL | 3 refills | Status: AC

## 2021-10-01 MED ORDER — ATORVASTATIN CALCIUM 80 MG PO TABS: 80.0000 mg | ORAL_TABLET | Freq: Every day | ORAL | 3 refills | Status: DC

## 2021-10-01 MED ORDER — AMLODIPINE BESYLATE 10 MG PO TABS: 10.0000 mg | ORAL_TABLET | Freq: Every day | ORAL | 3 refills | Status: AC

## 2021-10-01 NOTE — Progress Notes (Unsigned)
Office Note 10/01/2021  CC:  Chief Complaint  Patient presents with   Annual Exam    Pt is not fasting    HPI:  Patient is a 61 y.o. male who is here for annual health maintenance exam and follow-up hypertension and hyperlipidemia.  Has recently been dealing with left low back pain that radiates to the left flank--diagnosed with kidney stone by urology recently.  Expectant management has been the plan.  He has not seen any gross hematuria.  He states he is taking lisinopril 10 mg a day and amlodipine 10 mg a day.  Says blood pressure usually okay.  Hard to tell how long he has been taking lisinopril because I last prescribed this for him back in 2020 (30d supply).  Has been having eye discomfort, followed by ophthalmologist -->lymphangiectasia/inflammatory eye disease diagnosed; plan is for conjunctivochalasis excisional biopsy with amniotic membrane graft. Ophthalmologist believes this problem is secondary to his history of blepharoplasty. MRI orbits and brain with and without contrast were done 07/19/2021.  Report reviewed today.  He had normal orbits.  There were several small foci of T2 hyperintensity in the cerebral white matter of both hemispheres.  Nonspecific.  The radiologist commented that while the lesions could represent demyelination, the corpus callosal and periventricular lesions typical for MS are not present.  He goes on to state that these types of abnormalities are common in neurologically normal individuals and are of uncertain significance.  Otherwise his MRI brain was normal.  Past Medical History:  Diagnosis Date   Acquired renal cyst of left kidney 24/5809   Complicated cyst vs solid mass: contrast enhanced CT was recommended but ?apparently not done?   CAD (coronary artery disease)    1 vessel on coronary CT, Calcium score 69th %'tile for age, recommended inc statin and start ASA (04/2020)   Chronic renal insufficiency, stage II (mild)    Borderline stage II/III  (CrCl estimate @ low 60s)   COVID-19 virus detected 09/2018   congrstion and low fever x 2 and 1/2 weeks all symptoms resolved   Epididymal mass    left   History of gallstones    Lap chole 12/2018   History of kidney stones    ongoing L nephrolithiasis as of 08/2019 urol f/u   Hyperlipidemia    Hypertension    Injury 04/01/2020   right foot injury dropped weight on foot bruised and swollen using ice prn skin intact   Polycythemia 05/10/2010   EPO level normal-- IDIOPATHIC   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   MRI pituitary ordered but never done   Wears glasses    for reading    Past Surgical History:  Procedure Laterality Date   CHOLECYSTECTOMY N/A 01/10/2019   Procedure: LAPAROSCOPIC CHOLECYSTECTOMY;  Surgeon: Coralie Keens, MD;  Location: [facility];  Service: General;  Laterality: N/A;   COLONOSCOPY  09/30/11   Normal-repeat in 10 yrs   CYSTOSCOPY WITH STENT PLACEMENT Left 05/29/2015   Procedure: CYSTOSCOPY WITH STENT PLACEMENT, RETROGRADE;  Surgeon: Cleon Gustin, MD;  Location: [facility];  Service: Urology;  Laterality: Left;   CYSTOSCOPY/RETROGRADE/URETEROSCOPY/STONE EXTRACTION WITH BASKET Left 06/15/2015   Procedure: CYSTOSCOPY/RETROGRADE/URETEROSCOPY/STONE EXTRACTION WITH BASKET WITH STENT EXCHANGE;  Surgeon: Cleon Gustin, MD;  Location: [facility];  Service: Urology;  Laterality: Left;   HOLMIUM LASER APPLICATION Left 09/21/3380   Procedure: HOLMIUM LASER APPLICATION;  Surgeon: Cleon Gustin, MD;  Location: [facility];  Service: Urology;  Laterality: Left;   HYDROCELE  EXCISION Left 04/10/2020   Procedure: HYDROCELECTOMY ADULT;  Surgeon: Franchot Gallo, MD;  Location: [facility];  Service: Urology;  Laterality: Left;   KNEE SURGERY Right 2002   arthroscopic   SCROTAL EXPLORATION Left 04/10/2020   benign L epididymal mass.  Procedure: INGUINAL EXPLORATION AND EXCISION OF EPIDIDYMAL MASS;  Surgeon: Franchot Gallo, MD;  Location: [facility];  Service: Urology;  Laterality: Left;  1 HR    Family History  Problem Relation Age of Onset   Hypertension Mother    Food Allergy Mother    Cancer Father        lung/ smoker   Kidney disease Brother    HIV Brother     Social History   Socioeconomic History   Marital status: Married    Spouse name: Not on file   Number of children: Not on file   Years of education: Not on file   Highest education level: Not on file  Occupational History   Not on file  Tobacco Use   Smoking status: Never   Smokeless tobacco: Never  Vaping Use   Vaping Use: Never used  Substance and Sexual Activity   Alcohol use: Yes    Comment: ovv   Drug use: No   Sexual activity: Yes    Partners: Female  Other Topics Concern   Not on file  Social History Narrative   Married, 3 children (32, 38, 21 yrs).  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   Works out and plays sports regularly.  Rare wine intake.  No cigs or drugs.   Social Determinants of Health   Financial Resource Strain: Not on file  Food Insecurity: Not on file  Transportation Needs: Not on file  Physical Activity: Not on file  Stress: Not on file  Social Connections: Not on file  Intimate Partner Violence: Not on file    Outpatient Medications Prior to Visit  Medication Sig Dispense Refill   amLODipine (NORVASC) 10 MG tablet Take 1 tablet (10 mg total) by mouth daily. 90 tablet 2   atorvastatin (LIPITOR) 80 MG tablet TAKE 1 TABLET BY MOUTH EVERY DAY 30 tablet 0   tamsulosin (FLOMAX) 0.4 MG CAPS capsule Take 0.4 mg by mouth daily. (Patient not taking: Reported on 10/01/2021)     No facility-administered medications prior to visit.    No Known Allergies  ROS Review of Systems  Constitutional:  Negative for appetite change, chills, fatigue and fever.  HENT:  Negative for congestion, dental problem, ear pain and sore  throat.   Eyes:  Negative for discharge, redness and visual disturbance.  Respiratory:  Negative for cough, chest tightness, shortness of breath and wheezing.   Cardiovascular:  Negative for chest pain, palpitations and leg swelling.  Gastrointestinal:  Negative for abdominal pain, blood in stool, diarrhea, nausea and vomiting.  Genitourinary:  Positive for flank pain. Negative for difficulty urinating, dysuria, frequency, hematuria and urgency.  Musculoskeletal:  Positive for back pain. Negative for arthralgias, joint swelling, myalgias and neck stiffness.  Skin:  Negative for pallor and rash.  Neurological:  Negative for dizziness, speech difficulty, weakness and headaches.  Hematological:  Negative for adenopathy. Does not bruise/bleed easily.  Psychiatric/Behavioral:  Negative for confusion and sleep disturbance. The patient is not nervous/anxious.     PE;    10/01/2021    1:32 PM 10/01/2021    1:27 PM 01/23/2021  11:44 AM  Vitals with BMI  Height  6' 0.25"   Weight  215 lbs 13 oz   BMI  17.61   Systolic 607 371 062  Diastolic 84 80 90  Pulse  69      Gen: Alert, well appearing.  Patient is oriented to person, place, time, and situation. AFFECT: pleasant, lucid thought and speech. ENT: Ears: EACs clear, normal epithelium.  TMs with good light reflex and landmarks bilaterally.  Eyes: no injection, icteris, swelling, or exudate.  EOMI, PERRLA. Nose: no drainage or turbinate edema/swelling.  No injection or focal lesion.  Mouth: lips without lesion/swelling.  Oral mucosa pink and moist.  Dentition intact and without obvious caries or gingival swelling.  Oropharynx without erythema, exudate, or swelling.  Neck: supple/nontender.  No LAD, mass, or TM.  Carotid pulses 2+ bilaterally, without bruits. CV: RRR, no m/r/g.   LUNGS: CTA bilat, nonlabored resps, good aeration in all lung fields. ABD: soft, NT, ND, BS normal.  No hepatospenomegaly or mass.  No bruits. EXT: no clubbing,  cyanosis, or edema.  Musculoskeletal: no joint swelling, erythema, warmth, or tenderness.  ROM of all joints intact. Skin - no sores or suspicious lesions or rashes or color changes  Pertinent labs:  Lab Results  Component Value Date   TSH 1.93 04/05/2020   Lab Results  Component Value Date   WBC 17.8 (H) 07/03/2020   HGB 16.3 07/03/2020   HCT 50.8 07/03/2020   MCV 91.5 07/03/2020   PLT 227 07/03/2020   Lab Results  Component Value Date   CREATININE 1.20 07/03/2020   BUN 23 (H) 07/03/2020   NA 137 07/03/2020   K 3.7 07/03/2020   CL 102 07/03/2020   CO2 24 07/03/2020   Lab Results  Component Value Date   ALT 64 (H) 06/22/2020   AST 33 06/22/2020   ALKPHOS 66 04/05/2020   BILITOT 0.5 04/05/2020   Lab Results  Component Value Date   CHOL 147 06/22/2020   Lab Results  Component Value Date   HDL 45.20 06/22/2020   Lab Results  Component Value Date   LDLCALC 80 06/22/2020   Lab Results  Component Value Date   TRIG 106.0 06/22/2020   Lab Results  Component Value Date   CHOLHDL 3 06/22/2020   Lab Results  Component Value Date   PSA 2.38 04/05/2020   PSA 2.2 09/01/2017   PSA 0.75 07/22/2011   Lab Results  Component Value Date   HGBA1C 5.5 09/01/2017   ASSESSMENT AND PLAN:   #1 hypertension, unclear control.  It is often up in the medical setting but not always. Decided to keep him on lisinopril 10 mg a day and amlodipine 10 mg a day. Electrolytes and creatinine today.  #2 hypercholesterolemia, history of elevated coronary calcium score. He is on atorvastatin 80 mg a day. Lipid panel and hepatic panel today.  #3 chronic renal insufficiency, borderline stage II/III. Avoid NSAIDs. Hydrate well. Electrolytes and creatinine today.  4.  Ureterolithiasis.  Followed by urology, currently undergoing expectant management. Additionally, they are following him for a left renal lesion seen on recent imaging.  This kidney had a lesion back in 2005 and it is  unclear whether or not any follow-up imaging was done.  5. Health maintenance exam: Reviewed age and gender appropriate health maintenance issues (prudent diet, regular exercise, health risks of tobacco and excessive alcohol, use of seatbelts, fire alarms in home, use of sunscreen).  Also reviewed age and gender appropriate  health screening as well as vaccine recommendations. Vaccines: Tdap->declined.  Shingrix->declined.  Flu->declined. Labs: fasting HP labs +PSA ordered --return when fasting. Prostate ca screening: PSA ordered Colon ca screening:  due this year for recall.  An After Visit Summary was printed and given to the patient.  FOLLOW UP:  No follow-ups on file.  Signed:  Crissie Sickles, MD           10/01/2021

## 2021-10-01 NOTE — Patient Instructions (Addendum)
Please contact Dr.Pyrtle's office to schedule a repeat colonoscopy at [phone].  Health Maintenance, Male Adopting a healthy lifestyle and getting preventive care are important in promoting health and wellness. Ask your health care provider about: The right schedule for you to have regular tests and exams. Things you can do on your own to prevent diseases and keep yourself healthy. What should I know about diet, weight, and exercise? Eat a healthy diet  Eat a diet that includes plenty of vegetables, fruits, low-fat dairy products, and lean protein. Do not eat a lot of foods that are high in solid fats, added sugars, or sodium. Maintain a healthy weight Body mass index (BMI) is a measurement that can be used to identify possible weight problems. It estimates body fat based on height and weight. Your health care provider can help determine your BMI and help you achieve or maintain a healthy weight. Get regular exercise Get regular exercise. This is one of the most important things you can do for your health. Most adults should: Exercise for at least 150 minutes each week. The exercise should increase your heart rate and make you sweat (moderate-intensity exercise). Do strengthening exercises at least twice a week. This is in addition to the moderate-intensity exercise. Spend less time sitting. Even light physical activity can be beneficial. Watch cholesterol and blood lipids Have your blood tested for lipids and cholesterol at 60 years of age, then have this test every 5 years. You may need to have your cholesterol levels checked more often if: Your lipid or cholesterol levels are high. You are older than 60 years of age. You are at high risk for heart disease. What should I know about cancer screening? Many types of cancers can be detected early and may often be prevented. Depending on your health history and family history, you may need to have cancer screening at various ages. This may  include screening for: Colorectal cancer. Prostate cancer. Skin cancer. Lung cancer. What should I know about heart disease, diabetes, and high blood pressure? Blood pressure and heart disease High blood pressure causes heart disease and increases the risk of stroke. This is more likely to develop in people who have high blood pressure readings or are overweight. Talk with your health care provider about your target blood pressure readings. Have your blood pressure checked: Every 3-5 years if you are 27-38 years of age. Every year if you are 57 years old or older. If you are between the ages of 66 and 56 and are a current or former smoker, ask your health care provider if you should have a one-time screening for abdominal aortic aneurysm (AAA). Diabetes Have regular diabetes screenings. This checks your fasting blood sugar level. Have the screening done: Once every three years after age 49 if you are at a normal weight and have a low risk for diabetes. More often and at a younger age if you are overweight or have a high risk for diabetes. What should I know about preventing infection? Hepatitis B If you have a higher risk for hepatitis B, you should be screened for this virus. Talk with your health care provider to find out if you are at risk for hepatitis B infection. Hepatitis C Blood testing is recommended for: Everyone born from [birth year] through 1964. Anyone with known risk factors for hepatitis C. Sexually transmitted infections (STIs) You should be screened each year for STIs, including gonorrhea and chlamydia, if: You are sexually active and are younger than 60 years  of age. You are older than 60 years of age and your health care provider tells you that you are at risk for this type of infection. Your sexual activity has changed since you were last screened, and you are at increased risk for chlamydia or gonorrhea. Ask your health care provider if you are at risk. Ask your health care  provider about whether you are at high risk for HIV. Your health care provider may recommend a prescription medicine to help prevent HIV infection. If you choose to take medicine to prevent HIV, you should first get tested for HIV. You should then be tested every 3 months for as long as you are taking the medicine. Follow these instructions at home: Alcohol use Do not drink alcohol if your health care provider tells you not to drink. If you drink alcohol: Limit how much you have to 0-2 drinks a day. Know how much alcohol is in your drink. In the [place] one drink equals one 12 oz bottle of beer (355 mL), one 5 oz glass of wine (148 mL), or one 1 oz glass of hard liquor (44 mL). Lifestyle Do not use any products that contain nicotine or tobacco. These products include cigarettes, chewing tobacco, and vaping devices, such as e-cigarettes. If you need help quitting, ask your health care provider. Do not use street drugs. Do not share needles. Ask your health care provider for help if you need support or information about quitting drugs. General instructions Schedule regular health, dental, and eye exams. Stay current with your vaccines. Tell your health care provider if: You often feel depressed. You have ever been abused or do not feel safe at home. Summary Adopting a healthy lifestyle and getting preventive care are important in promoting health and wellness. Follow your health care provider's instructions about healthy diet, exercising, and getting tested or screened for diseases. Follow your health care provider's instructions on monitoring your cholesterol and blood pressure. This information is not intended to replace advice given to you by your health care provider. Make sure you discuss any questions you have with your health care provider. Document Revised: 05/22/2020 Document Reviewed: 05/22/2020 Elsevier Patient Education  [place].

## 2021-10-02 ENCOUNTER — Other Ambulatory Visit (INDEPENDENT_AMBULATORY_CARE_PROVIDER_SITE_OTHER): Payer: Managed Care, Other (non HMO)

## 2021-10-02 DIAGNOSIS — E7800 Pure hypercholesterolemia, unspecified: Secondary | ICD-10-CM

## 2021-10-02 DIAGNOSIS — R7989 Other specified abnormal findings of blood chemistry: Secondary | ICD-10-CM

## 2021-10-02 DIAGNOSIS — Z125 Encounter for screening for malignant neoplasm of prostate: Secondary | ICD-10-CM | POA: Diagnosis not present

## 2021-10-02 DIAGNOSIS — I10 Essential (primary) hypertension: Secondary | ICD-10-CM

## 2021-10-02 LAB — CBC
HCT: 46.7 % (ref 39.0–52.0)
Hemoglobin: 15.3 g/dL (ref 13.0–17.0)
MCHC: 32.7 g/dL (ref 30.0–36.0)
MCV: 90.4 fl (ref 78.0–100.0)
Platelets: 222 10*3/uL (ref 150.0–400.0)
RBC: 5.16 Mil/uL (ref 4.22–5.81)
RDW: 14.4 % (ref 11.5–15.5)
WBC: 5.3 10*3/uL (ref 4.0–10.5)

## 2021-10-02 LAB — COMPREHENSIVE METABOLIC PANEL
ALT: 34 U/L (ref 0–53)
AST: 16 U/L (ref 0–37)
Albumin: 4 g/dL (ref 3.5–5.2)
Alkaline Phosphatase: 71 U/L (ref 39–117)
BUN: 23 mg/dL (ref 6–23)
CO2: 26 mEq/L (ref 19–32)
Calcium: 9.6 mg/dL (ref 8.4–10.5)
Chloride: 105 mEq/L (ref 96–112)
Creatinine, Ser: 1.48 mg/dL (ref 0.40–1.50)
GFR: 51.13 mL/min — ABNORMAL LOW (ref >60.00)
Glucose, Bld: 82 mg/dL (ref 70–99)
Potassium: 4.4 mEq/L (ref 3.5–5.1)
Sodium: 139 mEq/L (ref 135–145)
Total Bilirubin: 0.5 mg/dL (ref 0.2–1.2)
Total Protein: 6.9 g/dL (ref 6.0–8.3)

## 2021-10-02 LAB — LIPID PANEL
Cholesterol: 167 mg/dL (ref 0–200)
HDL: 40.9 mg/dL (ref >39.00)
LDL Cholesterol: 105 mg/dL — ABNORMAL HIGH (ref 0–99)
NonHDL: 125.64
Total CHOL/HDL Ratio: 4
Triglycerides: 104 mg/dL (ref 0.0–149.0)
VLDL: 20.8 mg/dL (ref 0.0–40.0)

## 2021-10-02 LAB — PSA: PSA: 3.15 ng/mL (ref 0.10–4.00)

## 2021-10-02 LAB — TSH: TSH: 1.26 u[IU]/mL (ref 0.35–5.50)

## 2021-10-13 ENCOUNTER — Ambulatory Visit
Admission: RE | Admit: 2021-10-13 | Discharge: 2021-10-13 | Disposition: A | Discharge status: Home / Self Care | Payer: Managed Care, Other (non HMO) | Source: Ambulatory Visit | Attending: Urology | Admitting: Urology

## 2021-10-13 DIAGNOSIS — N281 Cyst of kidney, acquired: Secondary | ICD-10-CM

## 2021-10-13 MED ORDER — GADOPICLENOL 0.5 MMOL/ML IV SOLN
10.0000 mL | Freq: Once | INTRAVENOUS | Status: AC | PRN
  Administered 2021-10-13: 10 mL via INTRAVENOUS

## 2021-10-21 ENCOUNTER — Encounter: Payer: Self-pay | Admitting: Cardiology

## 2021-10-21 DIAGNOSIS — R931 Abnormal findings on diagnostic imaging of heart and coronary circulation: Secondary | ICD-10-CM | POA: Insufficient documentation

## 2021-10-21 NOTE — Progress Notes (Unsigned)
Cardiology Office Note   Date:  10/22/2021   ID:  Patrick Campbell, DOB [birth year], MRN [id number]  PCP:  Tammi Sou, MD  Cardiologist:   None Referring:  Tammi Sou, MD  Chief Complaint  Patient presents with   Shortness of Breath      History of Present Illness: Patrick Campbell is a 61 y.o. male who presents for evaluation of elevated coronary calcium and HTN.   He is referred by Tammi Sou, MD.   He had a calcium score which was 15 two years ago with was 69th percentile.  He has been having some shortness of breath.  This happens with activities such as walking briskly.  He does some yard work and walks for exercise.  He does not have any chest pressure, neck or arm discomfort.  He has no PND or orthopnea.  Is not noticing any palpitations, presyncope or syncope.  He has had no weight gain or edema.  He has not had any prior cardiac work-up.  He has had some high blood pressure but he says since he has been taking his medicines routinely this has been well controlled.   Past Medical History:  Diagnosis Date   Acquired renal cyst of left kidney 27/2536   Complicated cyst vs solid mass: contrast enhanced CT was recommended but ?apparently not done?   CAD (coronary artery disease)    1 vessel on coronary CT, Calcium score 69th %'tile for age, recommended inc statin and start ASA (04/2020)   Chronic renal insufficiency, stage II (mild)    Borderline stage II/III (CrCl estimate @ low 60s)   COVID-19 virus detected 09/2018   congrstion and low fever x 2 and 1/2 weeks all symptoms resolved   Epididymal mass    left   History of gallstones    Lap chole 12/2018   History of kidney stones    ongoing L nephrolithiasis as of 08/2019 urol f/u   Hyperlipidemia    Hypertension    Injury 04/01/2020   right foot injury dropped weight on foot bruised and swollen using ice prn skin intact   Polycythemia 05/10/2010   EPO level normal-- IDIOPATHIC   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   MRI pituitary ordered but never done    Past Surgical History:  Procedure Laterality Date   CHOLECYSTECTOMY N/A 01/10/2019   Procedure: LAPAROSCOPIC CHOLECYSTECTOMY;  Surgeon: Coralie Keens, MD;  Location: [facility];  Service: General;  Laterality: N/A;   COLONOSCOPY  09/30/11   Normal-repeat in 10 yrs   CYSTOSCOPY WITH STENT PLACEMENT Left 05/29/2015   Procedure: CYSTOSCOPY WITH STENT PLACEMENT, RETROGRADE;  Surgeon: Cleon Gustin, MD;  Location: [facility];  Service: Urology;  Laterality: Left;   CYSTOSCOPY/RETROGRADE/URETEROSCOPY/STONE EXTRACTION WITH BASKET Left 06/15/2015   Procedure: CYSTOSCOPY/RETROGRADE/URETEROSCOPY/STONE EXTRACTION WITH BASKET WITH STENT EXCHANGE;  Surgeon: Cleon Gustin, MD;  Location: [facility];  Service: Urology;  Laterality: Left;   HOLMIUM LASER APPLICATION Left 06/17/4032   Procedure: HOLMIUM LASER APPLICATION;  Surgeon: Cleon Gustin, MD;  Location: [facility];  Service: Urology;  Laterality: Left;   HYDROCELE EXCISION Left 04/10/2020   Procedure: HYDROCELECTOMY ADULT;  Surgeon: Franchot Gallo, MD;  Location: [facility];  Service: Urology;  Laterality: Left;   KNEE SURGERY Right 2002   arthroscopic   SCROTAL EXPLORATION Left 04/10/2020   benign L epididymal mass.  Procedure: INGUINAL EXPLORATION AND EXCISION OF EPIDIDYMAL MASS;  Surgeon: Franchot Gallo, MD;  Location:  [place];  Service: Urology;  Laterality: Left;  1 HR     Current Outpatient Medications  Medication Sig Dispense Refill   amLODipine (NORVASC) 10 MG tablet Take 1 tablet (10 mg total) by mouth daily. 90 tablet 3   lisinopril (ZESTRIL) 10 MG tablet Take 1 tablet (10 mg total) by mouth daily. 90 tablet 3   rosuvastatin (CRESTOR) 40 MG tablet Take 1 tablet (40 mg total) by mouth daily. 90 tablet 3   tamsulosin (FLOMAX) 0.4 MG CAPS capsule Take 0.4 mg by mouth daily. (Patient not taking:  Reported on 10/01/2021)     No current facility-administered medications for this visit.    Allergies:   Patient has no known allergies.    Social History:  The patient  reports that he has never smoked. He has never used smokeless tobacco. He reports current alcohol use. He reports that he does not use drugs.  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.  Family History:  The patient's family history includes Cancer in his father; Food Allergy in his mother; HIV in his brother; Hypertension in his mother; Kidney disease in his brother.    ROS:  Please see the history of present illness.   Otherwise, review of systems are positive for none.   All other systems are reviewed and negative.    PHYSICAL EXAM: VS:  BP 128/76   Pulse (!) 54   Ht 6' (1.829 m)   Wt 215 lb (97.5 kg)   SpO2 97%   BMI 29.16 kg/m  , BMI Body mass index is 29.16 kg/m. GENERAL:  Well appearing HEENT:  Pupils equal round and reactive, fundi not visualized, oral mucosa unremarkable NECK:  No jugular venous distention, waveform within normal limits, carotid upstroke brisk and symmetric, no bruits, no thyromegaly LYMPHATICS:  No cervical, inguinal adenopathy LUNGS:  Clear to auscultation bilaterally BACK:  No CVA tenderness CHEST:  Unremarkable HEART:  PMI not displaced or sustained,S1 and S2 within normal limits, no S3, no S4, no clicks, no rubs, no murmurs ABD:  Flat, positive bowel sounds normal in frequency in pitch, no bruits, no rebound, no guarding, no midline pulsatile mass, no hepatomegaly, no splenomegaly EXT:  2 plus pulses throughout, no edema, no cyanosis no clubbing SKIN:  No rashes no nodules NEURO:  Cranial nerves II through XII grossly intact, motor grossly intact throughout PSYCH:  Cognitively intact, oriented to person place and time    EKG:  EKG is ordered today. The ekg ordered today demonstrates sinus rhythm, rate 54, axis within normal limits, intervals within normal limits, no  acute ST-T wave changes.   Recent Labs: 10/02/2021: ALT 34; BUN 23; Creatinine, Ser 1.48; Hemoglobin 15.3; Platelets 222.0; Potassium 4.4; Sodium 139; TSH 1.26    Lipid Panel    Component Value Date/Time   CHOL 167 10/02/2021 0839   TRIG 104.0 10/02/2021 0839   HDL 40.90 10/02/2021 0839   CHOLHDL 4 10/02/2021 0839   VLDL 20.8 10/02/2021 0839   LDLCALC 105 (H) 10/02/2021 0839      Wt Readings from Last 3 Encounters:  10/22/21 215 lb (97.5 kg)  10/01/21 215 lb 12.8 oz (97.9 kg)  01/23/21 221 lb 3.2 oz (100.3 kg)      Other studies Reviewed: Additional studies/ records that were reviewed today include: Calcium score, labs. Review of the above records demonstrates:  Please see elsewhere in the note.     ASSESSMENT AND PLAN:  HTN: His blood pressure is  currently well controlled on the meds as listed as he is taking his meds consistently.  No change in therapy.  ELEVATED CORONARY CALCIUM: He has an elevated calcium score and some shortness of breath.  I would like to screen him with a POET (Plain Old Exercise Treadmill)  DYSLIPIDEMIA: He is not at target with his lipids.  I am going to change him to Crestor 40 mg daily and repeat a lipid profile in 3 months.  Neck step would be Zetia.  We talked about a Mediterranean plant-based diet.  I also outlined exercise strategies with him.   Current medicines are reviewed at length with the patient today.  The patient does not have concerns regarding medicines.  The following changes have been made: As above  Labs/ tests ordered today include:   Orders Placed This Encounter  Procedures   Lipid panel   EXERCISE TOLERANCE TEST (ETT)   EKG 12-Lead     Disposition:   FU with me in 1 year   Signed, Minus Breeding, MD  10/22/2021 1:36 PM    [place]

## 2021-10-22 ENCOUNTER — Ambulatory Visit: Payer: Managed Care, Other (non HMO) | Attending: Cardiology | Admitting: Cardiology

## 2021-10-22 ENCOUNTER — Encounter: Payer: Self-pay | Admitting: Cardiology

## 2021-10-22 VITALS — BP 128/76 | HR 54 | Ht 72.0 in | Wt 215.0 lb

## 2021-10-22 DIAGNOSIS — I10 Essential (primary) hypertension: Secondary | ICD-10-CM | POA: Diagnosis not present

## 2021-10-22 DIAGNOSIS — R931 Abnormal findings on diagnostic imaging of heart and coronary circulation: Secondary | ICD-10-CM

## 2021-10-22 MED ORDER — ROSUVASTATIN CALCIUM 40 MG PO TABS: 40.0000 mg | ORAL_TABLET | Freq: Every day | ORAL | 3 refills | Status: DC

## 2021-10-22 NOTE — Patient Instructions (Signed)
Medication Instructions:   -Stop atorvastatin.  -Start taking rosuvastatin (crestor) '40mg'$  once daily.  *If you need a refill on your cardiac medications before your next appointment, please call your pharmacy*   Lab Work: Your physician recommends that you return for lab work in: 3 months for FASTING lipid panel  If you have labs (blood work) drawn today and your tests are completely normal, you will receive your results only by: [place] (if you have MyChart) OR A paper copy in the mail If you have any lab test that is abnormal or we need to change your treatment, we will call you to review the results.   Testing/Procedures: Your physician has requested that you have an exercise tolerance test.  Please also follow instruction sheet, as given. This will take place at [place], [address]. Do not drink or eat foods with caffeine for 24 hours before the test. (Chocolate, coffee, tea, or energy drinks) If you use an inhaler, bring it with you to the test. Do not smoke for 4 hours before the test. Wear comfortable shoes and clothing.    Follow-Up: At [facility], you and your health needs are our priority.  As part of our continuing mission to provide you with exceptional heart care, we have created designated Provider Care Teams.  These Care Teams include your primary Cardiologist (physician) and Advanced Practice Providers (APPs -  Physician Assistants and Nurse Practitioners) who all work together to provide you with the care you need, when you need it.  We recommend signing up for the patient portal called "MyChart".  Sign up information is provided on this After Visit Summary.  MyChart is used to connect with patients for Virtual Visits (Telemedicine).  Patients are able to view lab/test results, encounter notes, upcoming appointments, etc.  Non-urgent messages can be sent to your provider as well.   To learn more about what you can do with MyChart, go to  [email or web address].    Your next appointment:   12 month(s)  The format for your next appointment:   In Person  Provider:   Minus Breeding, MD   Other Instructions Dr. Percival Spanish recommends watching "Game changer" on netflix.

## 2021-11-01 ENCOUNTER — Telehealth (HOSPITAL_COMMUNITY): Payer: Self-pay | Admitting: *Deleted

## 2021-11-01 NOTE — Telephone Encounter (Signed)
Close encounter 

## 2021-11-02 ENCOUNTER — Ambulatory Visit (HOSPITAL_COMMUNITY)
Admission: RE | Admit: 2021-11-02 | Discharge: 2021-11-02 | Disposition: A | Discharge status: Home / Self Care | Payer: Managed Care, Other (non HMO) | Source: Ambulatory Visit | Attending: Cardiovascular Disease | Admitting: Cardiovascular Disease

## 2021-11-02 DIAGNOSIS — R931 Abnormal findings on diagnostic imaging of heart and coronary circulation: Secondary | ICD-10-CM | POA: Diagnosis present

## 2021-11-02 DIAGNOSIS — I10 Essential (primary) hypertension: Secondary | ICD-10-CM | POA: Diagnosis present

## 2021-11-02 LAB — EXERCISE TOLERANCE TEST
Angina Index: 0
Duke Treadmill Score: 12
Estimated workload: 13.8
Exercise duration (min): 12 min
Exercise duration (sec): 6 s
MPHR: 160 {beats}/min
Peak HR: 171 {beats}/min
Percent HR: 106 %
Rest HR: 61 {beats}/min
ST Depression (mm): 0 mm

## 2021-11-05 ENCOUNTER — Encounter: Payer: Self-pay | Admitting: Family Medicine

## 2021-11-12 ENCOUNTER — Encounter: Payer: Self-pay | Admitting: *Deleted

## 2022-01-02 NOTE — Progress Notes (Signed)
This encounter was created in error - please disregard.

## 2022-01-28 IMAGING — CT CT CARDIAC CORONARY ARTERY CALCIUM SCORE
2 series · 15 of 20 positions shown, 17 images · non-contrast
Comparison: None.
COMPARISON: None.

Addendum:
EXAM:
OVER-READ INTERPRETATION  CT CHEST

The following report is an over-read performed by radiologist Dr.
Amauris Kistner [REDACTED] on 04/21/2020. This
over-read does not include interpretation of cardiac or coronary
anatomy or pathology. The coronary calcium score interpretation by
the cardiologist is attached.
CLINICAL DATA: Risk stratification: 59 Year-old African American
Male
Coronary Calcium Score
TECHNIQUE: The patient was scanned on a Siemens Force scanner. Axial
non-contrast 3 mm slices were carried out through the heart. The
data set was analyzed on a dedicated work station and scored using
the Agatson method.

[Series 2: casc 3.0 i36f 2 bestdiast 65 % · axial · 0.39mm/px · z∈[-246,-150]mm · 8 of 42 slices shown, 10 images]
[im 5/42  vessel]
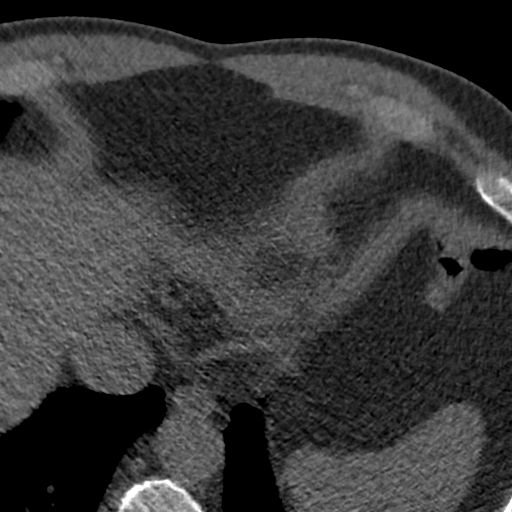
[im 5/42  lung]
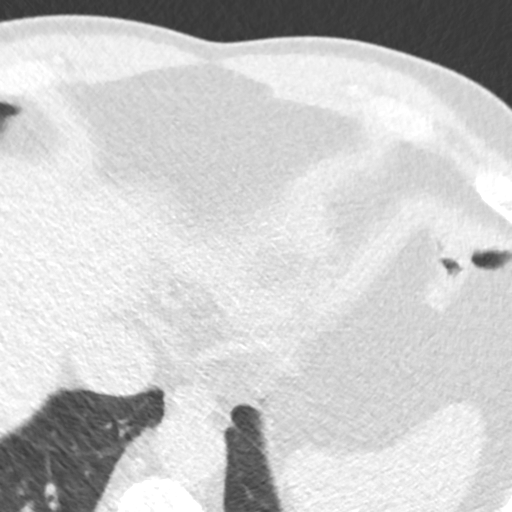
[im 10/42  vessel]
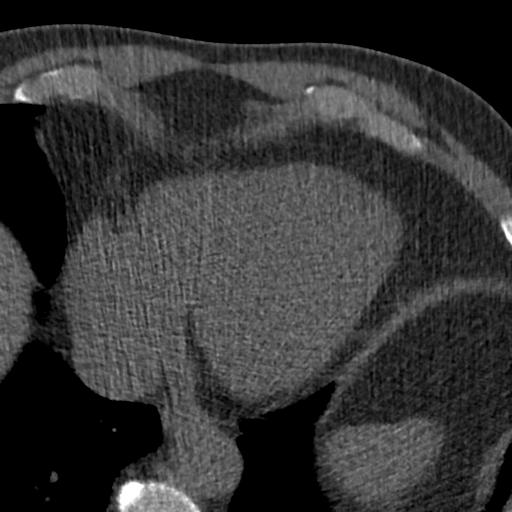
[im 14/42  vessel]
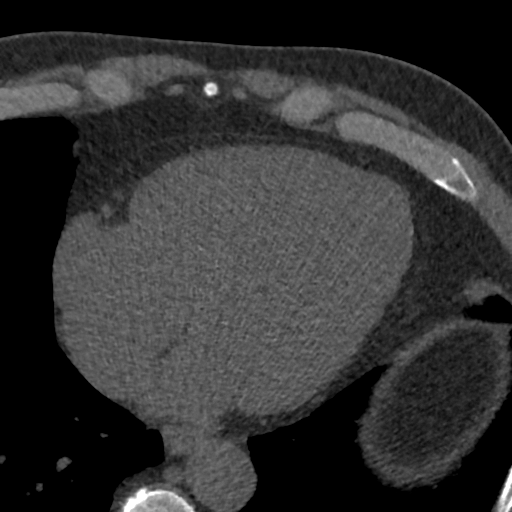
[im 19/42  vessel]
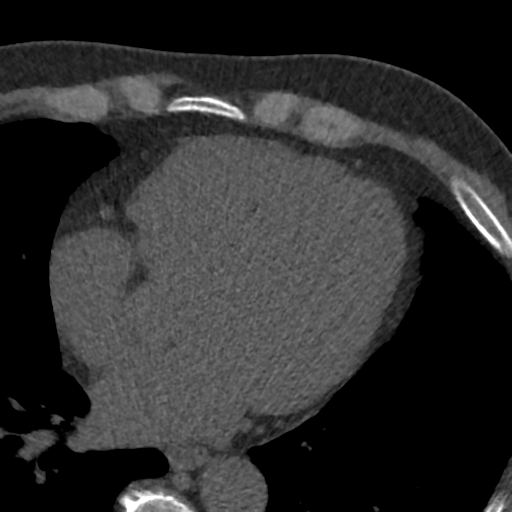
[im 23/42  vessel]
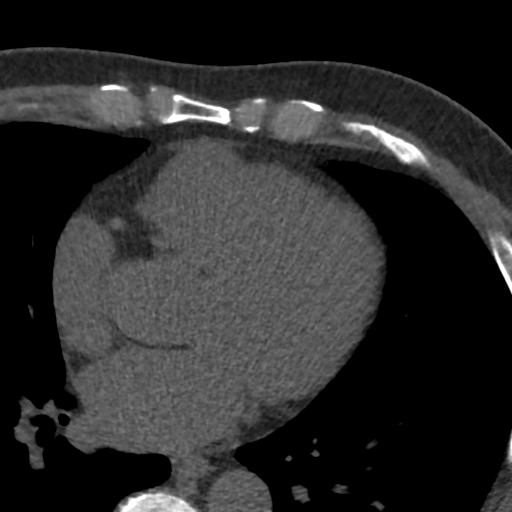
[im 23/42  lung]
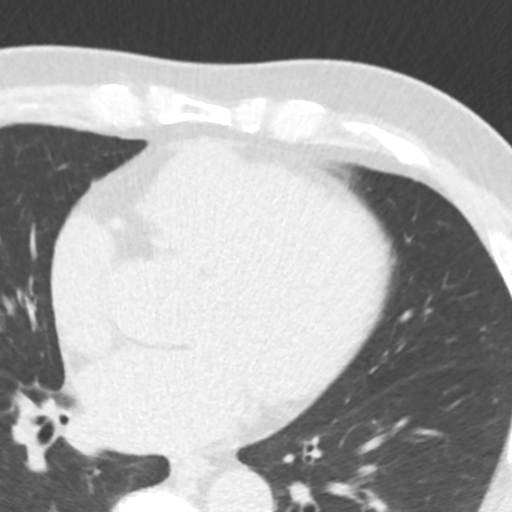
[im 28/42  vessel]
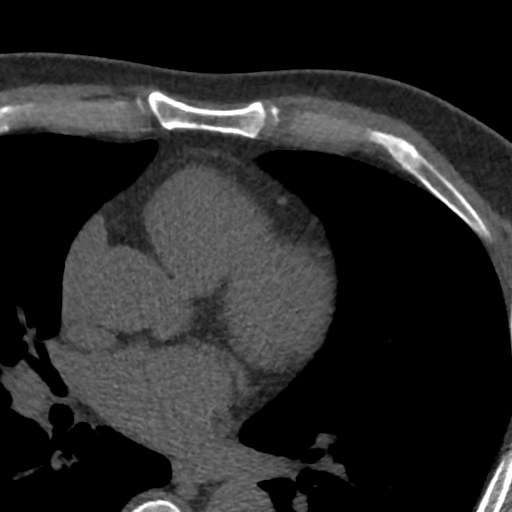
[im 32/42  vessel]
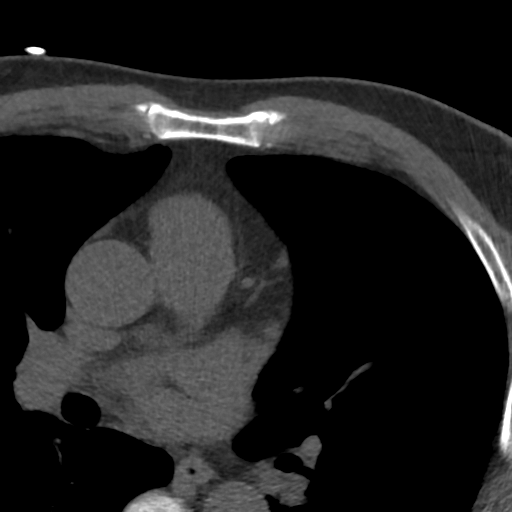
[im 37/42  vessel]
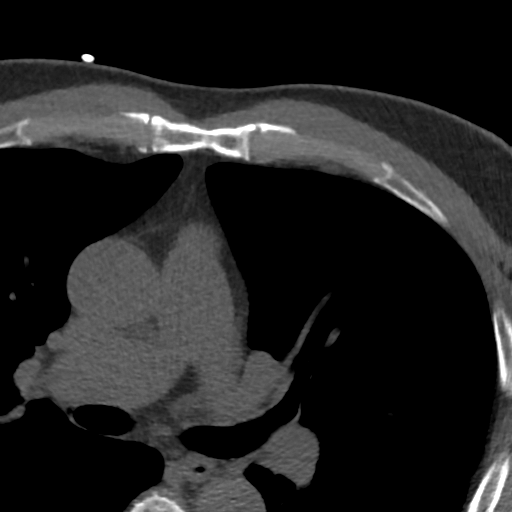

[Series 4: lung st 66 % · axial · 0.75mm/px · z∈[-246,-165]mm · 7 of 42 slices shown]
[im 5/42  lung]
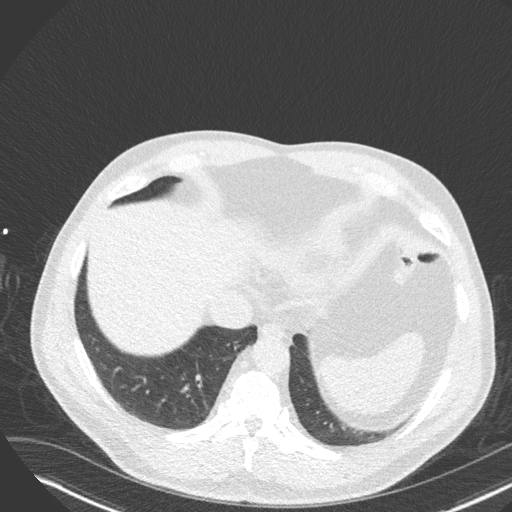
[im 10/42  lung]
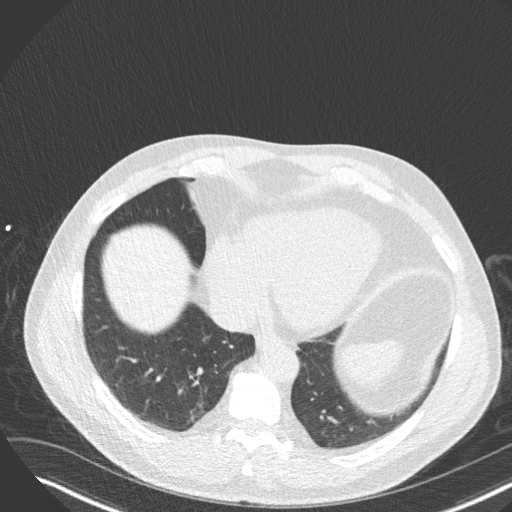
[im 14/42  lung]
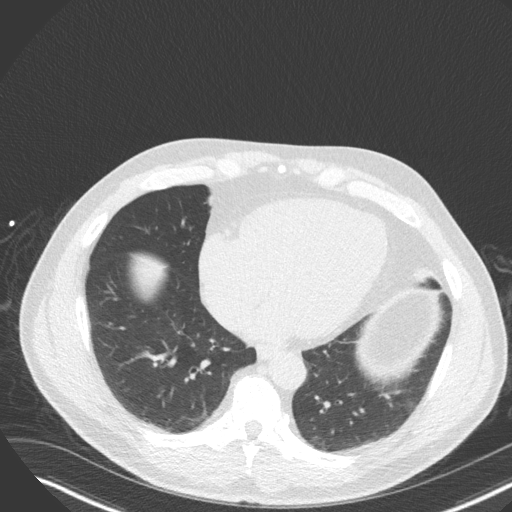
[im 19/42  lung]
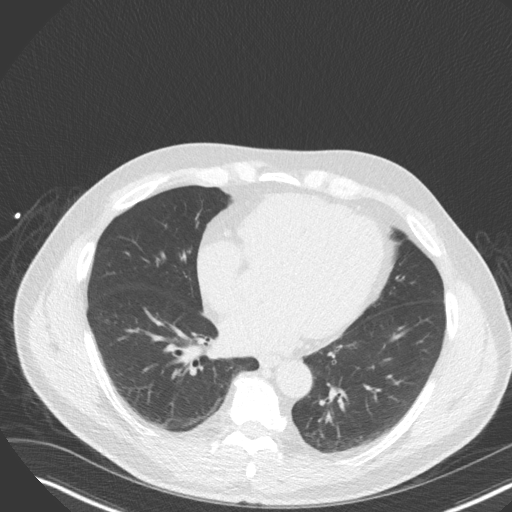
[im 23/42  lung]
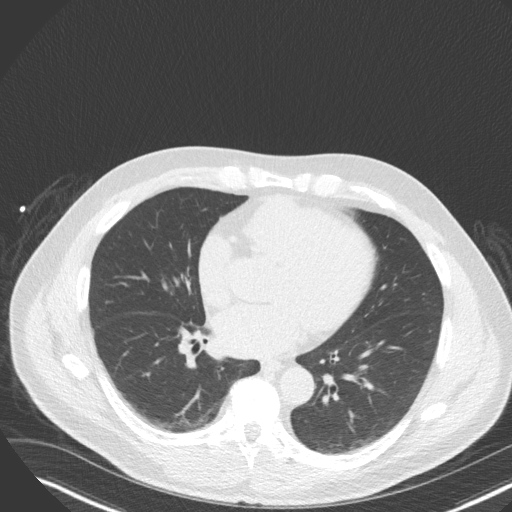
[im 28/42  lung]
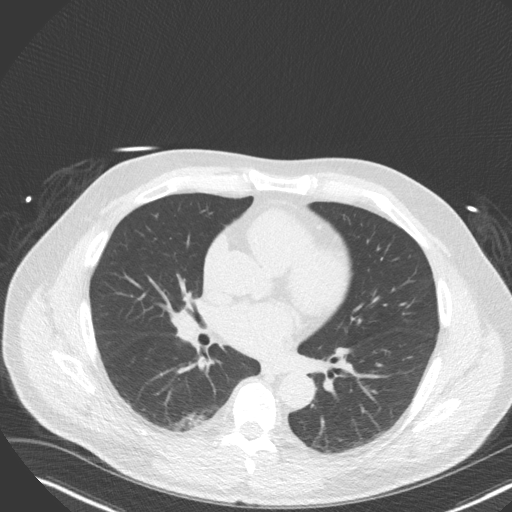
[im 32/42  lung]
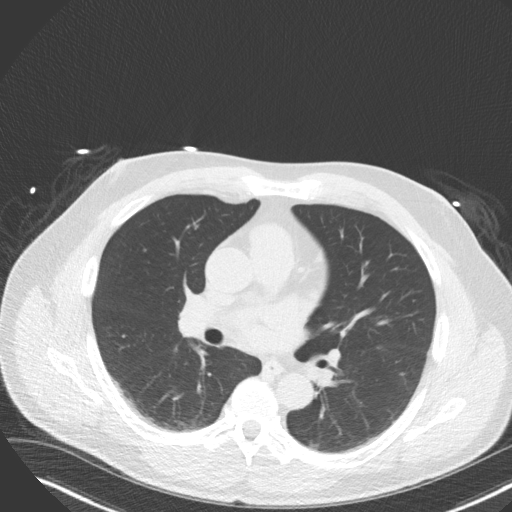

[15 of 20 positions shown; findings below may reference images not displayed]

FINDINGS: Within the visualized portions of the thorax there are no suspicious
appearing pulmonary nodules or masses, there is no acute
consolidative airspace disease, no pleural effusions, no
pneumothorax and no lymphadenopathy. Visualized portions of the
upper abdomen are unremarkable. There are no aggressive appearing
lytic or blastic lesions noted in the visualized portions of the
skeleton.
IMPRESSION: 1. No significant incidental noncardiac findings are noted.
FINDINGS: Non-cardiac: See separate report from [REDACTED].

Ascending Aorta: Normal caliber.

Pericardium: Normal.

Coronary arteries: Normal origins.

Coronary Calcium Score:

Left main: 0

Left anterior descending artery: 15

Left circumflex artery: 0

Right coronary artery: 0

Total: 15

Percentile: 69th  for age, sex, and race matched control.
IMPRESSION: 1. Coronary calcium score of 15. This was 69th percentile for age,
gender, and race matched controls.

RECOMMENDATIONS:

Coronary artery calcium (CAC) score is a strong predictor of
incident coronary heart disease (CHD) and provides predictive
information beyond traditional risk factors. CAC scoring is
reasonable to use in the decision to withhold, postpone, or initiate
statin therapy in intermediate-risk or selected borderline-risk
asymptomatic adults (age 40-75 years and LDL-C ?70 to <190 mg/dL)
who do not have diabetes or established atherosclerotic
cardiovascular disease (ASCVD).* In intermediate-risk (10-year ASCVD
risk ?7.5% to <20%) adults or selected borderline-risk (10-year
ASCVD risk ?5% to <7.5%) adults in whom a CAC score is measured for
the purpose of making a treatment decision the following
recommendations have been made:

If CAC = 0, it is reasonable to withhold statin therapy and reassess
in 5 to 10 years, as long as higher risk conditions are absent
(diabetes mellitus, family history of premature CHD in first degree
relatives (males <55 years; females <65 years), cigarette smoking,
LDL ?190 mg/dL or other independent risk factors).

If CAC is 1 to 99, it is reasonable to initiate statin therapy for
patients ?55 years of age.

If CAC is ?100 or ?75th percentile, it is reasonable to initiate
statin therapy at any age.

Cardiology referral should be considered for patients with CAC
scores ?400 or ?75th percentile.

*5872 AHA/ACC/AACVPR/AAPA/ABC/CHANA/MOTOTSUGU/MAGDA/[place]/TIGER/DOTSON/RIGOBERTO
Guideline on the Management of Blood Cholesterol: A Report of the
American College of Cardiology/American Heart Association Task Force
on Clinical Practice Guidelines. J Am Coll Cardiol.
8920;73(24):7302-7294.

*** End of Addendum ***
EXAM:
OVER-READ INTERPRETATION  CT CHEST

The following report is an over-read performed by radiologist Dr.
Amauris Kistner [REDACTED] on 04/21/2020. This
over-read does not include interpretation of cardiac or coronary
anatomy or pathology. The coronary calcium score interpretation by
the cardiologist is attached.
FINDINGS: Within the visualized portions of the thorax there are no suspicious
appearing pulmonary nodules or masses, there is no acute
consolidative airspace disease, no pleural effusions, no
pneumothorax and no lymphadenopathy. Visualized portions of the
upper abdomen are unremarkable. There are no aggressive appearing
lytic or blastic lesions noted in the visualized portions of the
skeleton.
IMPRESSION: 1. No significant incidental noncardiac findings are noted.

## 2022-02-22 ENCOUNTER — Encounter: Payer: Self-pay | Admitting: Internal Medicine

## 2022-02-26 ENCOUNTER — Encounter: Payer: Self-pay | Admitting: *Deleted

## 2022-03-05 ENCOUNTER — Other Ambulatory Visit: Payer: Self-pay | Admitting: Adult Health

## 2022-03-05 DIAGNOSIS — D49512 Neoplasm of unspecified behavior of left kidney: Secondary | ICD-10-CM

## 2022-04-01 ENCOUNTER — Ambulatory Visit: Payer: Managed Care, Other (non HMO) | Admitting: Family Medicine

## 2022-04-01 NOTE — Progress Notes (Deleted)
OFFICE VISIT  04/01/2022  CC: No chief complaint on file.   Patient is a 62 y.o. male who presents for 11-month follow-up hypertension, hypercholesterolemia, and chronic renal insufficiency. A/P as of last visit: "1 hypertension, unclear control.  It is often up in the medical setting but not always. Decided to keep him on lisinopril 10 mg a day and amlodipine 10 mg a day. Electrolytes and creatinine today.   #2 hypercholesterolemia, history of elevated coronary calcium score. He is on atorvastatin 80 mg a day. Lipid panel and hepatic panel today.   #3 chronic renal insufficiency, borderline stage II/III. Avoid NSAIDs. Hydrate well. Electrolytes and creatinine today.   4.  Ureterolithiasis.  Followed by urology, currently undergoing expectant management. Additionally, they are following him for a left renal lesion seen on recent imaging.  This kidney had a lesion back in 2005 and it is unclear whether or not any follow-up imaging was done.   5. Health maintenance exam: Reviewed age and gender appropriate health maintenance issues (prudent diet, regular exercise, health risks of tobacco and excessive alcohol, use of seatbelts, fire alarms in home, use of sunscreen).  Also reviewed age and gender appropriate health screening as well as vaccine recommendations. Vaccines: Tdap->declined.  Shingrix->declined.  Flu->declined. Labs: fasting HP labs +PSA ordered --return when fasting. Prostate ca screening: PSA ordered Colon ca screening:  due this year for recall."  INTERIM HX: ***  Past Medical History:  Diagnosis Date   Acquired renal cyst of left kidney [id number]   Complicated cyst vs solid mass: contrast enhanced CT was recommended but ?apparently not done?   CAD (coronary artery disease)    1 vessel on coronary CT, Calcium score 69th %'tile for age, recommended inc statin and start ASA (04/2020)   Chronic renal insufficiency, stage II (mild)    Borderline stage II/III (CrCl  estimate @ low 60s)   COVID-19 virus detected 09/2018   congrstion and low fever x 2 and 1/2 weeks all symptoms resolved   Epididymal mass    left   History of gallstones    Lap chole 12/2018   History of kidney stones    ongoing L nephrolithiasis as of 08/2019 urol f/u   Hyperlipidemia    Hypertension    Injury 04/01/2020   right foot injury dropped weight on foot bruised and swollen using ice prn skin intact   Polycythemia 05/10/2010   EPO level normal-- IDIOPATHIC   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   MRI pituitary ordered but never done    Past Surgical History:  Procedure Laterality Date   CHOLECYSTECTOMY N/A 01/10/2019   Procedure: LAPAROSCOPIC CHOLECYSTECTOMY;  Surgeon: Coralie Keens, MD;  Location: [facility];  Service: General;  Laterality: N/A;   COLONOSCOPY  09/30/2011   Normal-repeat in 10 yrs   CYSTOSCOPY WITH STENT PLACEMENT Left 05/29/2015   Procedure: CYSTOSCOPY WITH STENT PLACEMENT, RETROGRADE;  Surgeon: Cleon Gustin, MD;  Location: [facility];  Service: Urology;  Laterality: Left;   CYSTOSCOPY/RETROGRADE/URETEROSCOPY/STONE EXTRACTION WITH BASKET Left 06/15/2015   Procedure: CYSTOSCOPY/RETROGRADE/URETEROSCOPY/STONE EXTRACTION WITH BASKET WITH STENT EXCHANGE;  Surgeon: Cleon Gustin, MD;  Location[facility];  Service: Urology;  Laterality: Left;   ETT     11/03/21 (Dr. Moses Manners ischemia   HOLMIUM LASER APPLICATION Left [id number]   Procedure: HOLMIUM LASER APPLICATION;  Surgeon: Cleon Gustin, MD;  Location: [facility];  Service: Urology;  Laterality: Left;   HYDROCELE EXCISION Left 04/10/2020   Procedure: HYDROCELECTOMY ADULT;  Surgeon: Diona Fanti,  Annie Main, MD;  Location: [facility];  Service: Urology;  Laterality: Left;   KNEE SURGERY Right 2002   arthroscopic   SCROTAL EXPLORATION Left 04/10/2020   benign L epididymal mass.  Procedure: INGUINAL EXPLORATION AND EXCISION OF EPIDIDYMAL MASS;   Surgeon: Franchot Gallo, MD;  Location: [facility];  Service: Urology;  Laterality: Left;  1 HR    Outpatient Medications Prior to Visit  Medication Sig Dispense Refill   Testosterone 1.62 % GEL Apply 2 Pump topically daily.     amLODipine (NORVASC) 10 MG tablet Take 1 tablet (10 mg total) by mouth daily. 90 tablet 3   lisinopril (ZESTRIL) 10 MG tablet Take 1 tablet (10 mg total) by mouth daily. 90 tablet 3   rosuvastatin (CRESTOR) 40 MG tablet Take 1 tablet (40 mg total) by mouth daily. 90 tablet 3   tamsulosin (FLOMAX) 0.4 MG CAPS capsule Take 0.4 mg by mouth daily. (Patient not taking: Reported on 10/01/2021)     No facility-administered medications prior to visit.    No Known Allergies  Review of Systems As per HPI  PE:    10/22/2021   11:01 AM 10/01/2021    1:32 PM 10/01/2021    1:27 PM  Vitals with BMI  Height 6\' 0"   6' 0.25"  Weight 215 lbs  215 lbs 13 oz  BMI [id number]  [id number]  Systolic 0000000 [id number]  Diastolic 76 84 80  Pulse 54  69     Physical Exam  ***  LABS:  Last CBC Lab Results  Component Value Date   WBC 5.3 10/02/2021   HGB 15.3 10/02/2021   HCT 46.7 10/02/2021   MCV 90.4 10/02/2021   MCH 29.4 07/03/2020   RDW 14.4 10/02/2021   PLT 222.0 [id number]   Last metabolic panel Lab Results  Component Value Date   GLUCOSE 82 10/02/2021   NA 139 10/02/2021   K 4.4 10/02/2021   CL 105 10/02/2021   CO2 26 10/02/2021   BUN 23 10/02/2021   CREATININE 1.48 10/02/2021   GFRNONAA >60 07/03/2020   CALCIUM 9.6 10/02/2021   PROT 6.9 10/02/2021   ALBUMIN 4.0 10/02/2021   BILITOT 0.5 10/02/2021   ALKPHOS 71 10/02/2021   AST 16 10/02/2021   ALT 34 10/02/2021   ANIONGAP 11 07/03/2020   Last lipids Lab Results  Component Value Date   CHOL 167 10/02/2021   HDL 40.90 10/02/2021   LDLCALC 105 (H) 10/02/2021   TRIG 104.0 10/02/2021   CHOLHDL 4 10/02/2021   Last hemoglobin A1c Lab Results  Component Value Date   HGBA1C 5.5 09/01/2017    Last thyroid functions Lab Results  Component Value Date   TSH 1.26 10/02/2021   Lab Results  Component Value Date   PSA 3.15 10/02/2021   PSA 2.38 04/05/2020   PSA 2.2 09/01/2017   IMPRESSION AND PLAN:  No problem-specific Assessment & Plan notes found for this encounter.   An After Visit Summary was printed and given to the patient.  FOLLOW UP: No follow-ups on file. 6 mo cpe Signed:  Crissie Sickles, MD           04/01/2022

## 2022-04-08 ENCOUNTER — Encounter: Payer: Self-pay | Admitting: Family Medicine

## 2022-04-11 IMAGING — US US RENAL
1 series · 14 of 25 positions shown · non-contrast
Comparison: Radiograph 07/03/2020, CT 08/30/2019

CLINICAL DATA: Left flank pain

EXAM:
RENAL / URINARY TRACT ULTRASOUND COMPLETE

[Series 1: us renal · 14 of 44 slices shown]
[im 1/44]
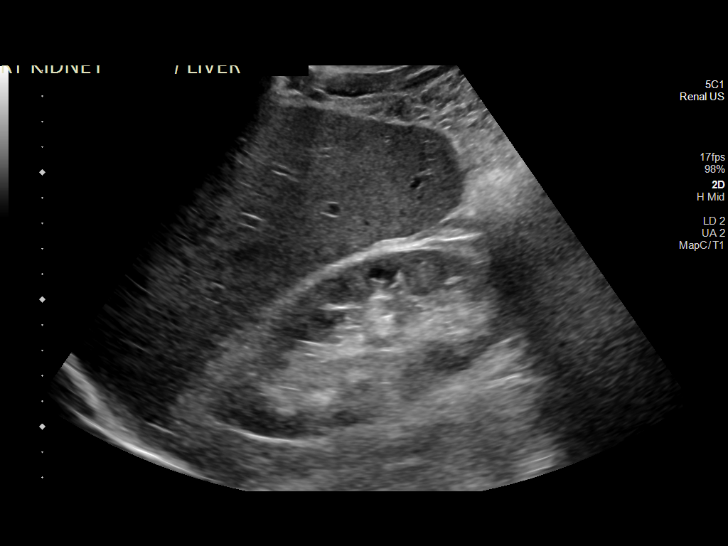
[im 4/44]
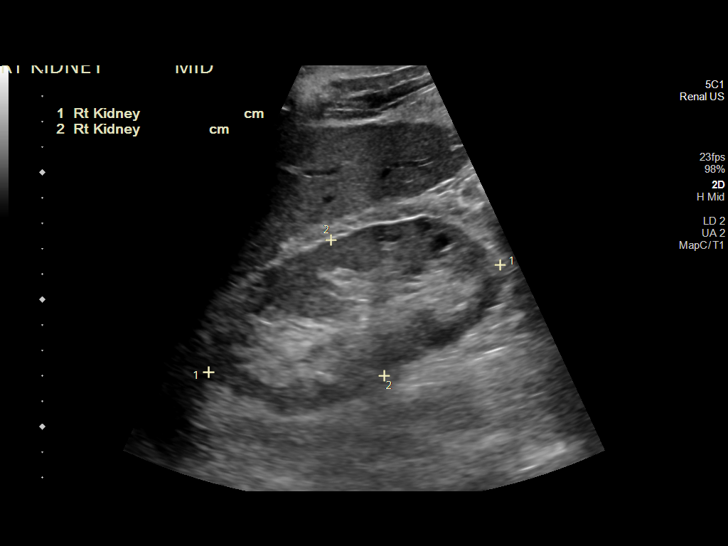
[im 8/44]
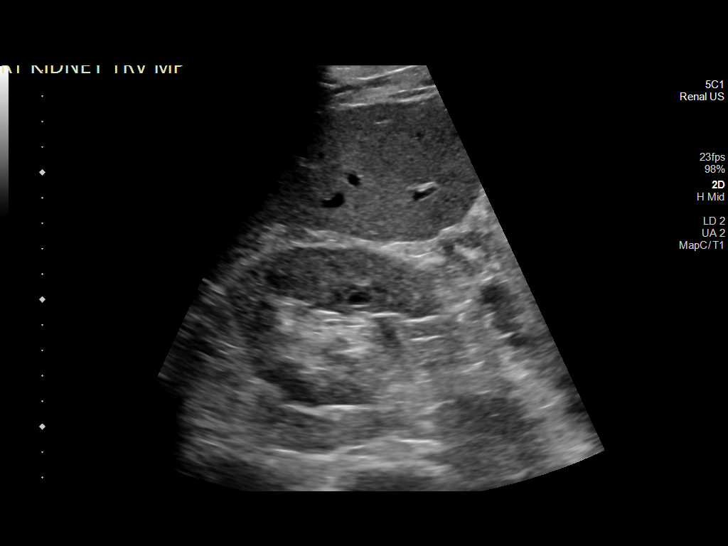
[im 11/44]
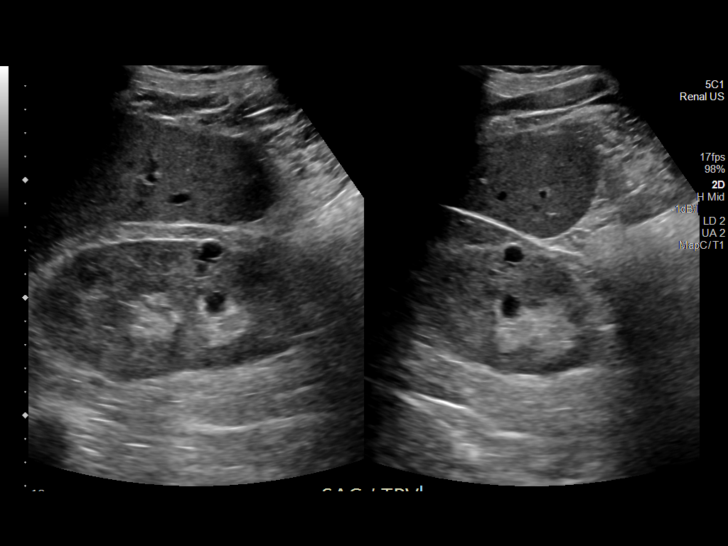
[im 15/44]
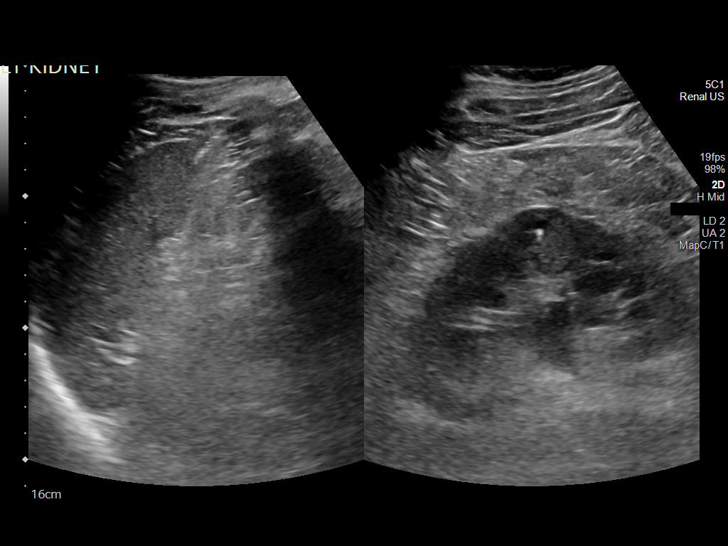
[im 17/44]
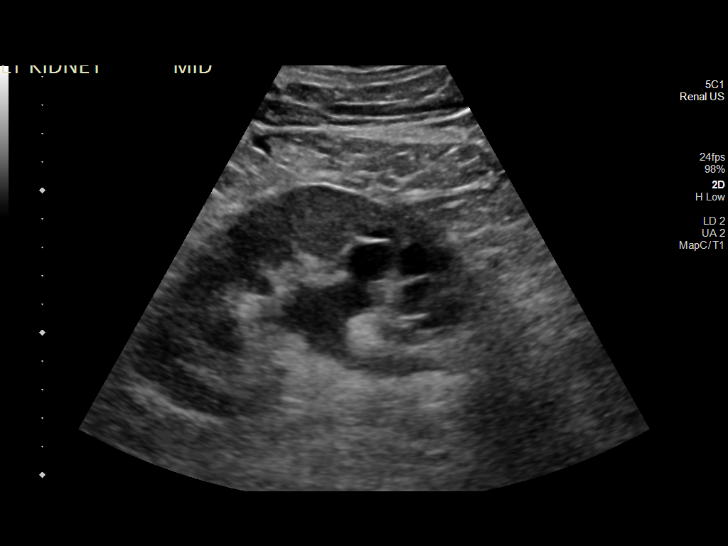
[im 20/44]
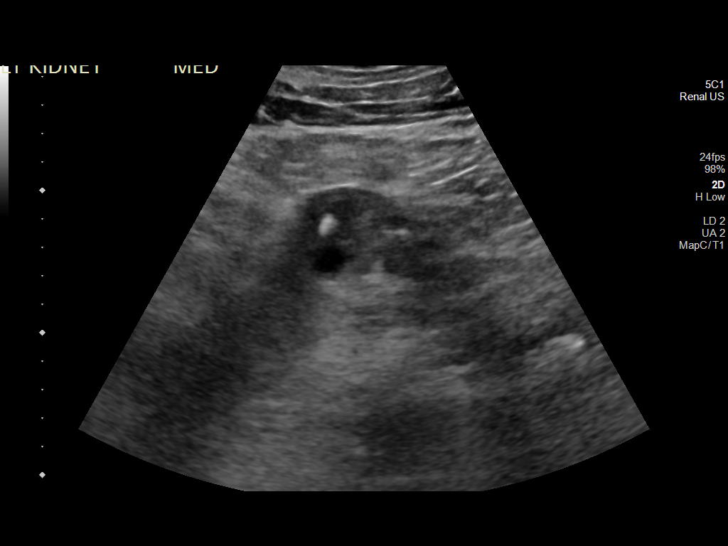
[im 24/44]
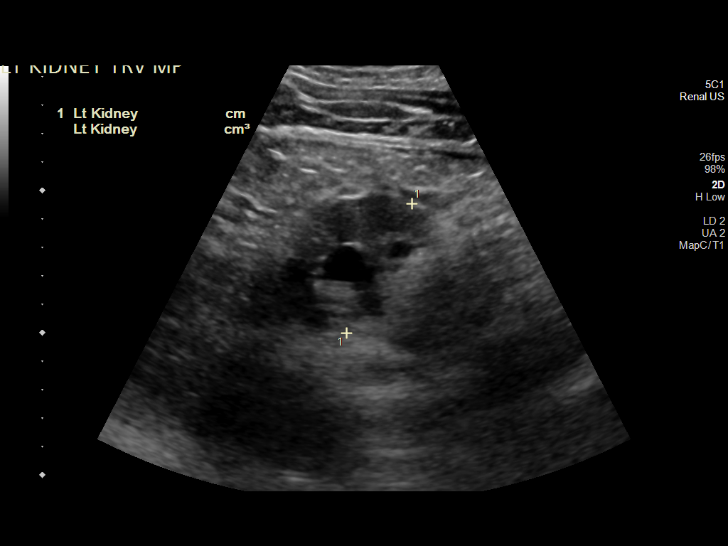
[im 27/44]
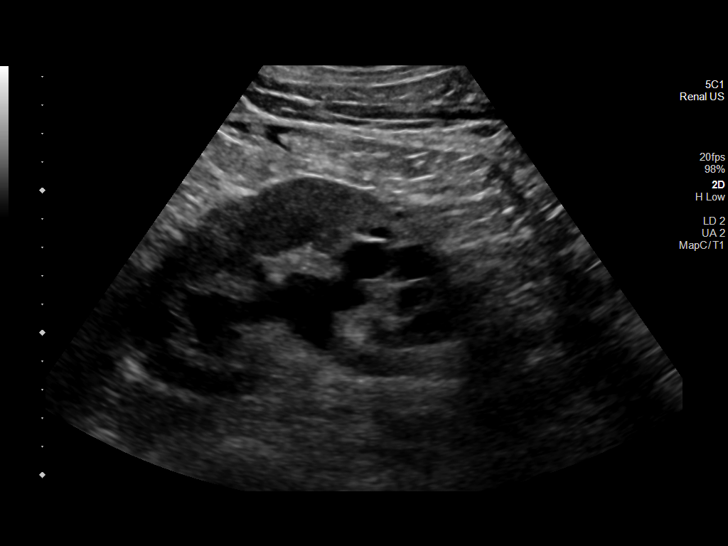
[im 29/44]
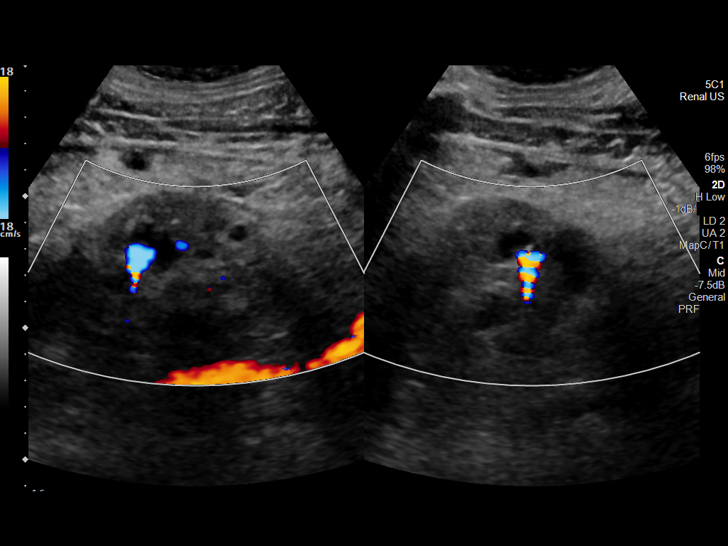
[im 33/44]
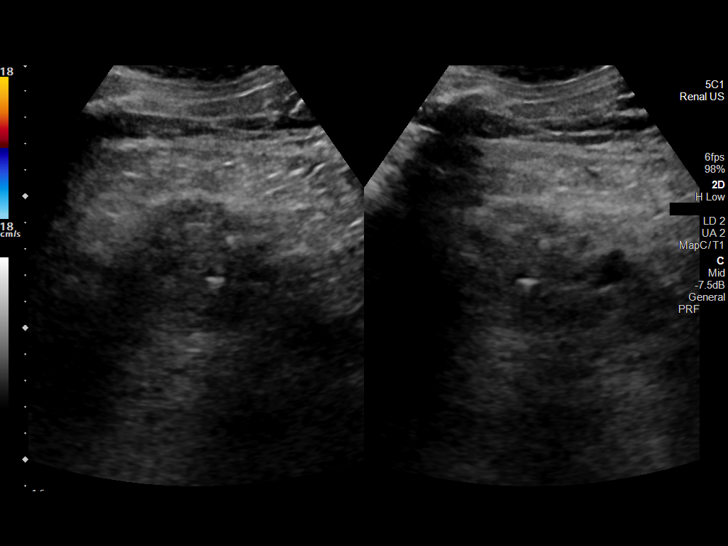
[im 36/44]
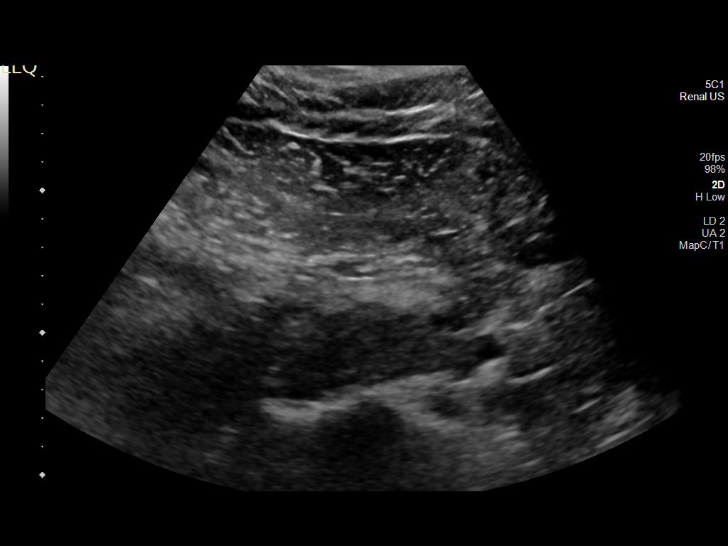
[im 40/44]
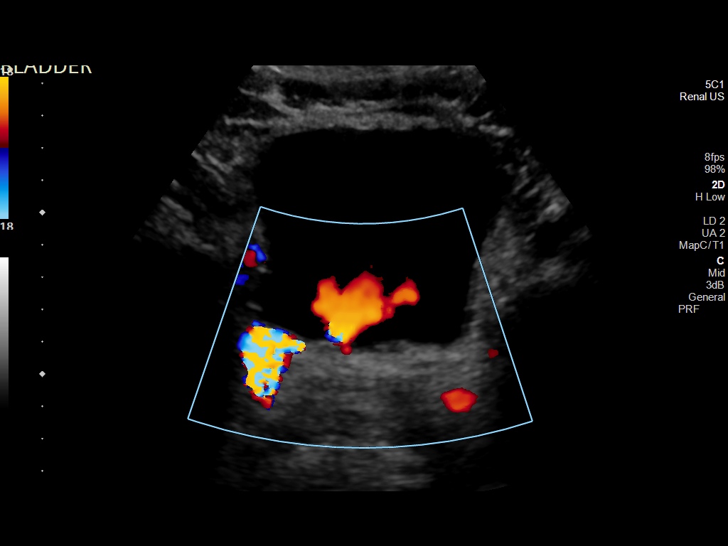
[im 44/44]
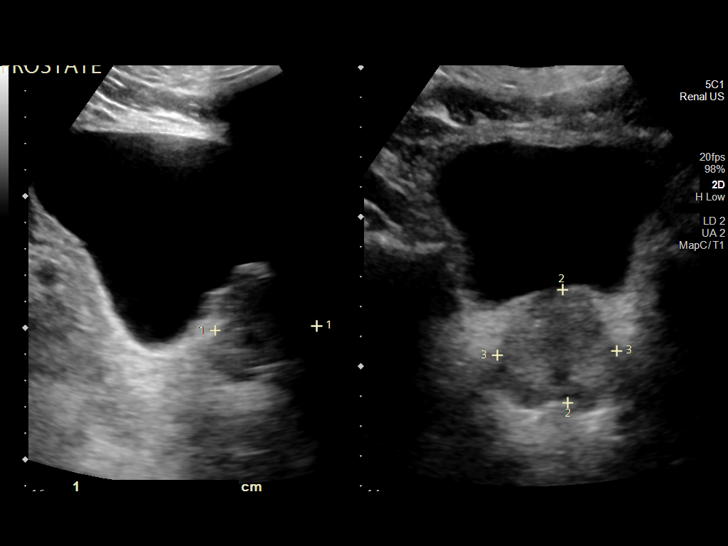

[14 of 25 positions shown; findings below may reference images not displayed]

FINDINGS: Right Kidney:

Renal measurements: 12.2 x 5.7 x 7.2 cm = volume: 264 mL.
Echogenicity within normal limits. No hydronephrosis. Cyst at the
lower pole measuring 11 x 8 x 9 mm

Left Kidney:

Renal measurements: 12.2 x 6.8 x 5.1 cm = volume: 220.7 mL.
Echogenicity within normal limits. No mass. Suspect multiple small
stones measuring up to 7 mm. Mild to moderate left hydronephrosis.

Bladder:

Appears normal for degree of bladder distention.

Other:

None.
IMPRESSION: 1. Mild to moderate left hydronephrosis.  Consider follow-up CT KUB
2. Suspect multiple small stones in the left kidney

## 2022-06-17 ENCOUNTER — Other Ambulatory Visit: Payer: Self-pay | Admitting: Specialist

## 2022-06-17 DIAGNOSIS — M5451 Vertebrogenic low back pain: Secondary | ICD-10-CM

## 2022-06-19 ENCOUNTER — Ambulatory Visit
Admission: RE | Admit: 2022-06-19 | Discharge: 2022-06-19 | Disposition: A | Discharge status: Home / Self Care | Payer: Managed Care, Other (non HMO) | Source: Ambulatory Visit | Attending: Specialist | Admitting: Specialist

## 2022-06-19 ENCOUNTER — Ambulatory Visit
Admission: RE | Admit: 2022-06-19 | Discharge: 2022-06-19 | Discharge status: Home / Self Care | Payer: Managed Care, Other (non HMO) | Source: Ambulatory Visit | Attending: Specialist | Admitting: Specialist

## 2022-06-19 DIAGNOSIS — M5451 Vertebrogenic low back pain: Secondary | ICD-10-CM

## 2022-06-19 MED ORDER — DIAZEPAM 5 MG PO TABS
10.0000 mg | ORAL_TABLET | Freq: Once | ORAL | Status: AC
  Administered 2022-06-19: 10 mg via ORAL

## 2022-06-19 MED ORDER — IOPAMIDOL (ISOVUE-M 200) INJECTION 41%
15.0000 mL | Freq: Once | INTRAMUSCULAR | Status: AC
  Administered 2022-06-19: 15 mL via INTRATHECAL

## 2022-06-19 MED ORDER — ONDANSETRON HCL 4 MG/2ML IJ SOLN
4.0000 mg | Freq: Once | INTRAMUSCULAR | Status: AC | PRN
  Administered 2022-06-19: 4 mg via INTRAMUSCULAR

## 2022-06-19 MED ORDER — MEPERIDINE HCL 50 MG/ML IJ SOLN
50.0000 mg | Freq: Once | INTRAMUSCULAR | Status: AC | PRN
  Administered 2022-06-19: 50 mg via INTRAMUSCULAR

## 2022-06-19 MED ORDER — DIAZEPAM 5 MG PO TABS: 5.0000 mg | ORAL_TABLET | Freq: Once | ORAL | Status: DC

## 2022-06-19 NOTE — Discharge Instr - Other Info (Addendum)
5284: pt reports pain 10/10 from myelogram procedure. See MAR 1010: pt reports pain 3/10 after pain medication. Pt resting in nursing recovery area until discharge.

## 2022-06-19 NOTE — Discharge Instructions (Signed)

## 2022-06-27 ENCOUNTER — Encounter: Payer: Self-pay | Admitting: Neurology

## 2022-07-02 ENCOUNTER — Encounter: Payer: Self-pay | Admitting: Internal Medicine

## 2022-07-16 NOTE — Progress Notes (Signed)
Initial neurology clinic note  Reason for Evaluation: Consultation requested by Jene Every, MD for an opinion regarding low back pain and leg numbness and tingling. My final recommendations will be communicated back to the requesting physician by way of shared medical record or letter to requesting physician via [place] mail.  HPI: This is Mr. Patrick Campbell, a 62 y.o. right-handed male with a medical history of HTN, HLD, lumbar radiculopathy who presents to neurology clinic with the chief complaint of low back pain and leg numbness and tingling. The patient is alone.  Patient has chronic low back pain. He is having numbness and tingling in bilateral legs as well. It is located behind his buttocks and outside of left thigh (left hip area, not further down the leg). It is worse when standing. He also cannot lay on his left side without his left thigh beginning to hurt. The symptoms in his bilateral buttocks and back have improved recently. His pain in the left thigh has not changed. He had acupuncture that seems to have helped. He had previously tried chiropractor that did not help. He has seen ortho and had an MRI lumbar spine with moderate L4-5 stenosis. He has had spinal injections that did not help. Patient was on gabapentin in the past, which he thinks helped, but was only able to take it at night due to sedation. 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 He recently got a desk that can be used when standing.   He denies wearing tight pants. He does wear a tight belt every day. He denies significant changes in his weight. He is active and works out at least 3 times per week. He denies changes to bowel or bladder including incontinence. He denies saddle anesthesia.  He denies symptoms in his arms, torso, or face.  Of note, patient was recently working on a fence at his home and had a fall down a [place] when he tripped. He has pain, swelling, and abrasions on both legs due to this fall.  EtOH use:  drinks 2 drinks, once per week  Restrictive diet? No Family history of neuropathy/myopathy/neurologic disease? No  Patient has not ever had an EMG.   MEDICATIONS:  Outpatient Encounter Medications as of 07/24/2022  Medication Sig   amLODipine (NORVASC) 10 MG tablet Take 1 tablet (10 mg total) by mouth daily.   lisinopril (ZESTRIL) 10 MG tablet Take 1 tablet (10 mg total) by mouth daily.   rosuvastatin (CRESTOR) 40 MG tablet Take 1 tablet (40 mg total) by mouth daily.   tamsulosin (FLOMAX) 0.4 MG CAPS capsule Take 0.4 mg by mouth daily. (Patient not taking: Reported on 10/01/2021)   Testosterone 1.62 % GEL Apply 2 Pump topically daily. (Patient not taking: Reported on 07/24/2022)   No facility-administered encounter medications on file as of 07/24/2022.    PAST MEDICAL HISTORY: Past Medical History:  Diagnosis Date   Acquired renal cyst of left kidney 11/2003   Complicated cyst vs solid mass: contrast enhanced CT was recommended but ?apparently not done?   CAD (coronary artery disease)    1 vessel on coronary CT, Calcium score 69th %'tile for age, recommended inc statin and start ASA (04/2020)   Chronic renal insufficiency, stage II (mild)    Borderline stage II/III (CrCl estimate @ low 60s)   COVID-19 virus detected 09/2018   congrstion and low fever x 2 and 1/2 weeks all symptoms resolved   Epididymal mass    left   History of gallstones  Lap chole 12/2018   History of kidney stones    ongoing L nephrolithiasis as of 08/2019 urol f/u   Hyperlipidemia    Hypertension    Injury 04/01/2020   right foot injury dropped weight on foot bruised and swollen using ice prn skin intact   Polycythemia 05/10/2010   EPO level normal-- IDIOPATHIC   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   MRI pituitary ordered but never done    PAST SURGICAL HISTORY: Past Surgical History:  Procedure Laterality Date   CHOLECYSTECTOMY N/A 01/10/2019   Procedure: LAPAROSCOPIC CHOLECYSTECTOMY;  Surgeon:  Abigail Miyamoto, MD;  Location: [facility];  Service: General;  Laterality: N/A;   COLONOSCOPY  09/30/2011   Normal-repeat in 10 yrs   CYSTOSCOPY WITH STENT PLACEMENT Left 05/29/2015   Procedure: CYSTOSCOPY WITH STENT PLACEMENT, RETROGRADE;  Surgeon: Malen Gauze, MD;  Location: [facility];  Service: Urology;  Laterality: Left;   CYSTOSCOPY/RETROGRADE/URETEROSCOPY/STONE EXTRACTION WITH BASKET Left 06/15/2015   Procedure: CYSTOSCOPY/RETROGRADE/URETEROSCOPY/STONE EXTRACTION WITH BASKET WITH STENT EXCHANGE;  Surgeon: Malen Gauze, MD;  Location: [facility];  Service: Urology;  Laterality: Left;   ETT     11/03/21 (Dr. Nena Jordan ischemia   HOLMIUM LASER APPLICATION Left 06/15/2015   Procedure: HOLMIUM LASER APPLICATION;  Surgeon: Malen Gauze, MD;  Location: [facility];  Service: Urology;  Laterality: Left;   HYDROCELE EXCISION Left 04/10/2020   Procedure: HYDROCELECTOMY ADULT;  Surgeon: Marcine Matar, MD;  Location: [facility];  Service: Urology;  Laterality: Left;   KNEE SURGERY Right 2002   arthroscopic   SCROTAL EXPLORATION Left 04/10/2020   benign L epididymal mass.  Procedure: INGUINAL EXPLORATION AND EXCISION OF EPIDIDYMAL MASS;  Surgeon: Marcine Matar, MD;  Location: [facility];  Service: Urology;  Laterality: Left;  1 HR    ALLERGIES: No Known Allergies  FAMILY HISTORY: Family History  Problem Relation Age of Onset   Hypertension Mother    Food Allergy Mother    Cancer Father        lung/ smoker   Kidney disease Brother    HIV Brother     SOCIAL HISTORY: Social History   Tobacco Use   Smoking status: Never   Smokeless tobacco: Never  Vaping Use   Vaping Use: Never used  Substance Use Topics   Alcohol use: Yes    Comment: ovv   Drug use: No   Social History   Social History Narrative   Married, 3 children (15, 20, 21 yrs).  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   From  [place].  Was in [employer] x 10 yrs in [place], [place].   Works out.  Rare wine intake.  No cigs or drugs.     OBJECTIVE: PHYSICAL EXAM: BP 118/72   Pulse 61   Ht 6' (1.829 m)   Wt 212 lb 14.4 oz (96.6 kg)   SpO2 94%   BMI 28.87 kg/m   General: General appearance: Awake and alert. No distress. Cooperative with exam.  Skin: No obvious rash or jaundice. HEENT: Atraumatic. Anicteric. Lungs: Non-labored breathing on room air  Extremities: Mild bilateral lower extremity edema. Abrasions on bilateral legs from recent fall.  Musculoskeletal: No obvious joint swelling. Psych: Affect appropriate.  Neurological: Mental Status: Alert. Speech fluent. No pseudobulbar affect Cranial Nerves: CNII: No RAPD. Visual fields grossly intact. CNIII, IV, VI: PERRL. No nystagmus. EOMI. CN V: Facial sensation intact bilaterally to fine touch. CN VII: Facial muscles symmetric and strong. No ptosis at  rest. CN VIII: Hearing grossly intact bilaterally. CN IX: No hypophonia. CN X: Palate elevates symmetrically. CN XI: Full strength shoulder shrug bilaterally. CN XII: Tongue protrusion full and midline. No atrophy or fasciculations. No significant dysarthria Motor: Tone is normal. No fasciculations in extremities. No atrophy.  Individual muscle group testing (MRC grade out of 5):  Movement     Neck flexion 5    Neck extension 5     Right Left   Shoulder abduction 5 5   Shoulder adduction 5 5   Elbow flexion 5 5   Elbow extension 5 5   Finger abduction - FDI 5 5   Finger abduction - ADM 5 5   Finger extension 5 5   Finger distal flexion - 2/3 5 5    Finger distal flexion - 4/5 5 5    Thumb flexion - FPL 5 5   Thumb abduction - APB 5 5    Hip flexion 5 5   Hip extension 5 5   Hip adduction 5 5   Hip abduction 5 5   Knee extension 5 5   Knee flexion 5 5   Dorsiflexion 5 5   Plantarflexion 5 5   Inversion 5 5   Eversion 5 5     Reflexes:  Right Left   Bicep 2+ 2+   Tricep 2+ 2+    BrRad 2+ 2+   Knee 3+ 3+ Cross adductors bilaterally  Ankle 2+ 2+    Pathological Reflexes: Babinski: flexor response bilaterally Hoffman: absent bilaterally Troemner: absent bilaterally Sensation: Pinprick: Intact in all extremities, including left lateral thigh Vibration: Intact in all extremities Proprioception: Intact in bilateral great toes Coordination: Intact finger-to- nose-finger bilaterally. Romberg negative. Gait: Able to rise from chair with arms crossed unassisted. Normal, narrow-based gait. Able to tandem walk. Able to walk on toes and heels.  Lab and Test Review: Internal labs: TSH (10/02/21): 1.26 CBC (10/02/21) wnl CMP (10/02/21) significant for Cr 1.48  Imaging: MRI lumbar spine (03/23/22):  CT lumbar spine w contrast (06/19/22): FINDINGS: LUMBAR MYELOGRAM FINDINGS:   Mild spinal canal stenosis at L3-4 and L4-5, increasing with flexion.   CT LUMBAR MYELOGRAM FINDINGS:   Conventional numbering is assumed with 5 non-rib-bearing, lumbar type vertebral bodies.   Normal vertebral body heights. No acute fracture or suspicious bone lesion.   No acute findings in the retroperitoneal or paraspinal soft tissues.   Mild degenerative changes of the bilateral sacroiliac joints.   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   T12-L1:  Normal.   L1-L2:  Normal.   L2-L3:  Normal.   L3-L4: Congenitally short pedicles, disc bulge, and spinal canal stenosis result in mild spinal canal stenosis and mild left neural foraminal narrowing.   L4-L5: Congenitally short pedicles, disc bulge, and spinal canal stenosis result in mild spinal canal stenosis. No significant neural foraminal narrowing.   L5-S1:  Normal.   IMPRESSION: 1. Mild spinal canal stenosis at L3-4 and L4-5, increasing with flexion. 2. Mild left neural foraminal narrowing at L3-4.  MRI brain w/wo contrast (07/27/11): IMPRESSION:  No evidence for pituitary or hypothalamic  origin for hypogonadism.   Normal cerebral volume with slight bilateral foci of subcortical  white matter signal abnormality and small remote right thalamic  lacunar infarct.  These could represent early manifestations of  chronic microvascular ischemic change to hypertension.  Correlate  clinically.   ASSESSMENT: Patrick Campbell is a 62 y.o. male who presents for evaluation of pain in low  back and bilateral lower extremities, particularly in left hip/lateral thigh. He has a relevant medical history of HTN, HLD, lumbar radiculopathy. His neurological examination is essentially normal today. Available diagnostic data is significant for recent CT myelogram that showed mild spinal canal stenosis at L3-4 and L4-5, increasing with flexion. Patient's symptoms may be due to lumbar radiculopathy and sound somewhat like neurogenic claudication, which symptoms worse with standing and improving with sitting. The left hip/lateral thigh pain could be spine related as well, but also could be compression of left lateral femoral cutaneous nerve at the hip or OA of the hip. I will get an EMG to further clarify.  PLAN: -EMG: L > R lower extremities -Discussed lifestyle changes, such as loosening belt, to avoid compression of nerves at the hip -May consider hip xray or work up for left hip arthritis -Discussed re-prescribing gabapentin, but patient prefers to hold off  -Return to clinic to be determined  The impression above as well as the plan as outlined below were extensively discussed with the patient who voiced understanding. All questions were answered to their satisfaction.  When available, results of the above investigations and possible further recommendations will be communicated to the patient via telephone/MyChart. Patient to call office if not contacted after expected testing turnaround time.   Total time spent reviewing records, interview, history/exam, documentation, and coordination of care on  day of encounter:  50 min   Thank you for allowing me to participate in patient's care.  If I can answer any additional questions, I would be pleased to do so.  Jacquelyne Balint, MD   CC: McGowen, Maryjean Morn, MD [address] [place] [address]  CC: Referring provider: Jene Every, MD [address] [place],  [place] [address]

## 2022-07-24 ENCOUNTER — Encounter: Payer: Self-pay | Admitting: Neurology

## 2022-07-24 ENCOUNTER — Ambulatory Visit: Payer: Managed Care, Other (non HMO) | Admitting: Neurology

## 2022-07-24 VITALS — BP 118/72 | HR 61 | Ht 72.0 in | Wt 212.9 lb

## 2022-07-24 DIAGNOSIS — M5442 Lumbago with sciatica, left side: Secondary | ICD-10-CM

## 2022-07-24 DIAGNOSIS — G8929 Other chronic pain: Secondary | ICD-10-CM

## 2022-07-24 DIAGNOSIS — M5441 Lumbago with sciatica, right side: Secondary | ICD-10-CM | POA: Diagnosis not present

## 2022-07-24 DIAGNOSIS — R200 Anesthesia of skin: Secondary | ICD-10-CM

## 2022-07-24 DIAGNOSIS — M25552 Pain in left hip: Secondary | ICD-10-CM

## 2022-07-24 DIAGNOSIS — R202 Paresthesia of skin: Secondary | ICD-10-CM

## 2022-07-24 NOTE — Patient Instructions (Addendum)
Your symptoms could be due to your back, but this is unclear. The left outside of your thigh could be arthritis in your hip or pinching of a nerve around the hip area (left lateral femoral cutaneous nerve). I would recommend not wearing tight clothing or belts and see if this helps those symptoms.  I would like to investigate your symptoms further with a muscle and nerve test called EMG (see more information below).   The physicians and staff at [facility] are committed to providing excellent care. You may receive a survey requesting feedback about your experience at our office. We strive to receive "very good" responses to the survey questions. If you feel that your experience would prevent you from giving the office a "very good " response, please contact our office to try to remedy the situation. We may be reached at [phone]. Thank you for taking the time out of your busy day to complete the survey.  Jacquelyne Balint, MD [facility]   ELECTROMYOGRAM AND NERVE CONDUCTION STUDIES (EMG/NCS) INSTRUCTIONS  How to Prepare The neurologist conducting the EMG will need to know if you have certain medical conditions. Tell the neurologist and other EMG lab personnel if you: Have a pacemaker or any other electrical medical device Take blood-thinning medications Have hemophilia, a blood-clotting disorder that causes prolonged bleeding Bathing Take a shower or bath shortly before your exam in order to remove oils from your skin. Don't apply lotions or creams before the exam.  What to Expect You'll likely be asked to change into a hospital gown for the procedure and lie down on an examination table. The following explanations can help you understand what will happen during the exam.  Electrodes. The neurologist or a technician places surface electrodes at various locations on your skin depending on where you're experiencing symptoms. Or the neurologist may insert needle electrodes at different  sites depending on your symptoms.  Sensations. The electrodes will at times transmit a tiny electrical current that you may feel as a twinge or spasm. The needle electrode may cause discomfort or pain that usually ends shortly after the needle is removed. If you are concerned about discomfort or pain, you may want to talk to the neurologist about taking a short break during the exam.  Instructions. During the needle EMG, the neurologist will assess whether there is any spontaneous electrical activity when the muscle is at rest - activity that isn't present in healthy muscle tissue - and the degree of activity when you slightly contract the muscle.  He or she will give you instructions on resting and contracting a muscle at appropriate times. Depending on what muscles and nerves the neurologist is examining, he or she may ask you to change positions during the exam.  After your EMG You may experience some temporary, minor bruising where the needle electrode was inserted into your muscle. This bruising should fade within several days. If it persists, contact your primary care doctor.

## 2022-08-12 ENCOUNTER — Encounter: Payer: Managed Care, Other (non HMO) | Admitting: Neurology

## 2022-08-22 ENCOUNTER — Encounter: Payer: Self-pay | Admitting: Neurology

## 2022-08-29 ENCOUNTER — Ambulatory Visit: Payer: Managed Care, Other (non HMO) | Admitting: Neurology

## 2022-09-09 ENCOUNTER — Ambulatory Visit: Payer: Managed Care, Other (non HMO) | Admitting: Neurology

## 2022-09-09 ENCOUNTER — Ambulatory Visit (AMBULATORY_SURGERY_CENTER): Payer: Managed Care, Other (non HMO)

## 2022-09-09 VITALS — Ht 72.0 in | Wt 205.0 lb

## 2022-09-09 DIAGNOSIS — R202 Paresthesia of skin: Secondary | ICD-10-CM | POA: Diagnosis not present

## 2022-09-09 DIAGNOSIS — R200 Anesthesia of skin: Secondary | ICD-10-CM

## 2022-09-09 DIAGNOSIS — Z1211 Encounter for screening for malignant neoplasm of colon: Secondary | ICD-10-CM

## 2022-09-09 MED ORDER — NA SULFATE-K SULFATE-MG SULF 17.5-3.13-1.6 GM/177ML PO SOLN: 1.0000 | Freq: Once | ORAL | 0 refills | Status: AC

## 2022-09-09 NOTE — Progress Notes (Signed)
 No egg or soy allergy known to patient  No issues known to pt with past sedation with any surgeries or procedures Patient denies ever being told they had issues or difficulty with intubation  No FH of Malignant Hyperthermia Pt is not on diet pills Pt is not on  home 02  Pt is not on blood thinners  Pt denies issues with constipation  No A fib or A flutter Have any cardiac testing pending--no  LOA: independent  Prep: suprep   Patient's chart reviewed by Cathlyn Parsons CNRA prior to previsit and patient appropriate for the [facility].  Previsit completed and red dot placed by patient's name on their procedure day (on provider's schedule).     PV competed with patient. Prep instructions sent via mychart

## 2022-09-09 NOTE — Procedures (Signed)
[facility]  [address] [place], [address]  [place], [place] [address] Tel: [phone] Fax: [phone] Test Date:  09/09/2022  Patient: Patrick Campbell DOB: [birth year] Physician: Jacquelyne Balint, MD  Sex: Male Height: 6\' 0"  Ref Phys: Jacquelyne Balint, MD  ID#: [id number]   Technician:    History: This is a 62 year old male with low back pain and numbness and tingling in legs.  NCV & EMG Findings: Extensive electrodiagnostic evaluation of bilateral lower limbs shows: Bilateral sural and superficial peroneal/fibular sensory responses are within normal limits. Bilateral peroneal/fibular (EDB) and tibial (AH) motor responses are within normal limits. Bilateral H reflex latencies are within normal limits. There is no evidence of active or chronic motor axon loss changes affecting any of the tested muscles. Motor unit configuration and recruitment pattern is within normal limits.  Impression: This is a normal study of the right and left lower limbs. In particular, there is no electrodiagnostic evidence of a right or left lumbosacral (L3-S1) radiculopathy, large fiber sensorimotor neuropathy, or myopathy.     ___________________________ Jacquelyne Balint, MD    Nerve Conduction Studies Motor Nerve Results    Latency Amplitude F-Lat Segment Distance CV Comment  Site (ms) Norm (mV) Norm (ms)  (cm) (m/s) Norm   Left Fibular (EDB) Motor  Ankle 3.7  < 6.0 5.7  > 2.5        Bel fib head 10.9 - 4.6 -  Bel fib head-Ankle 34.5 48  > 40   Pop fossa 12.9 - 4.3 -  Pop fossa-Bel fib head 10 50 -   Right Fibular (EDB) Motor  Ankle 2.9  < 6.0 5.5  > 2.5        Bel fib head 10.5 - 5.1 -  Bel fib head-Ankle 34 45  > 40   Pop fossa 12.8 - 4.9 -  Pop fossa-Bel fib head 10 43 -   Left Tibial (AH) Motor  Ankle 3.9  < 6.0 6.2  > 4.0        Knee 13.8 - 5.4 -  Knee-Ankle 45 45  > 40   Right Tibial (AH) Motor  Ankle 3.4  < 6.0 9.7  > 4.0        Knee 13.0 - 6.6 -  Knee-Ankle 45 47  > 40    Sensory  Sites    Neg Peak Lat Amplitude (O-P) Segment Distance Velocity Comment  Site (ms) Norm (V) Norm  (cm) (ms)   Left Superficial Fibular Sensory  14 cm-Ankle 2.7  < 4.6 8  > 3 14 cm-Ankle 14    Right Superficial Fibular Sensory  14 cm-Ankle 2.7  < 4.6 5  > 3 14 cm-Ankle 14    Left Sural Sensory  Calf-Lat mall 3.8  < 4.6 8  > 3 Calf-Lat mall 14    Right Sural Sensory  Calf-Lat mall 3.9  < 4.6 6  > 3 Calf-Lat mall 14     H-Reflex Results    M-Lat H Lat H Neg Amp H-M Lat  Site (ms) (ms) Norm (mV) (ms)  Left Tibial H-Reflex  Pop fossa 6.5 31.2  < 35.0 2.3 24.7  Right Tibial H-Reflex  Pop fossa 6.3 31.4  < 35.0 2.1 25.1   Electromyography   Side Muscle Ins.Act Fibs Fasc Recrt Amp Dur Poly Activation Comment  Left Tib ant Nml Nml Nml Nml Nml Nml Nml Nml N/A  Left Gastroc MH Nml Nml Nml Nml Nml Nml Nml Nml N/A  Left  FDL Nml Nml Nml Nml Nml Nml Nml Nml N/A  Left Rectus fem Nml Nml Nml Nml Nml Nml Nml Nml N/A  Left Biceps fem SH Nml Nml Nml Nml Nml Nml Nml Nml N/A  Left Gluteus med Nml Nml Nml Nml Nml Nml Nml Nml N/A  Left Lumbar PSP lower Nml Nml Nml Nml Nml Nml Nml Nml N/A  Right Tib ant Nml Nml Nml Nml Nml Nml Nml Nml N/A  Right Gastroc MH Nml Nml Nml Nml Nml Nml Nml Nml N/A  Right FDL Nml Nml Nml Nml Nml Nml Nml Nml N/A  Right Rectus fem Nml Nml Nml Nml Nml Nml Nml Nml N/A  Right Biceps fem SH Nml Nml Nml Nml Nml Nml Nml Nml N/A  Right Gluteus med Nml Nml Nml Nml Nml Nml Nml Nml N/A      Waveforms:  Motor           Sensory           H-Reflex

## 2022-09-10 ENCOUNTER — Encounter: Payer: Self-pay | Admitting: Neurology

## 2022-09-17 ENCOUNTER — Encounter: Payer: Self-pay | Admitting: Emergency Medicine

## 2022-09-17 ENCOUNTER — Encounter: Payer: Self-pay | Admitting: Internal Medicine

## 2022-09-22 ENCOUNTER — Encounter: Payer: Self-pay | Admitting: Certified Registered Nurse Anesthetist

## 2022-09-30 ENCOUNTER — Encounter: Payer: Self-pay | Admitting: Internal Medicine

## 2022-09-30 ENCOUNTER — Ambulatory Visit: Payer: Managed Care, Other (non HMO) | Admitting: Internal Medicine

## 2022-09-30 VITALS — BP 121/68 | HR 57 | Temp 98.6°F | Resp 12 | Ht 72.0 in | Wt 205.0 lb

## 2022-09-30 DIAGNOSIS — D125 Benign neoplasm of sigmoid colon: Secondary | ICD-10-CM | POA: Diagnosis not present

## 2022-09-30 DIAGNOSIS — D120 Benign neoplasm of cecum: Secondary | ICD-10-CM

## 2022-09-30 DIAGNOSIS — D122 Benign neoplasm of ascending colon: Secondary | ICD-10-CM | POA: Diagnosis not present

## 2022-09-30 DIAGNOSIS — Z1211 Encounter for screening for malignant neoplasm of colon: Secondary | ICD-10-CM

## 2022-09-30 MED ORDER — SODIUM CHLORIDE 0.9 % IV SOLN: 500.0000 mL | INTRAVENOUS | Status: DC

## 2022-09-30 NOTE — Progress Notes (Signed)
Patient states there have been no changes to medical or surgical history since time of pre-visit.

## 2022-09-30 NOTE — Patient Instructions (Addendum)
- Resume previous diet.                           - Continue present medications.                           - Await pathology results.                           - Repeat colonoscopy is recommended. The                            colonoscopy date will be determined after pathology                            results from today's exam become available for                            review.  5 polyps removed and sent to pathology.  Handouts on findings given to patient.  ( Polyps, hemorrhoids)  YOU HAD AN ENDOSCOPIC PROCEDURE TODAY AT THE [facility]:   Refer to the procedure report that was given to you for any specific questions about what was found during the examination.  If the procedure report does not answer your questions, please call your gastroenterologist to clarify.  If you requested that your care partner not be given the details of your procedure findings, then the procedure report has been included in a sealed envelope for you to review at your convenience later.  YOU SHOULD EXPECT: Some feelings of bloating in the abdomen. Passage of more gas than usual.  Walking can help get rid of the air that was put into your GI tract during the procedure and reduce the bloating. If you had a lower endoscopy (such as a colonoscopy or flexible sigmoidoscopy) you may notice spotting of blood in your stool or on the toilet paper. If you underwent a bowel prep for your procedure, you may not have a normal bowel movement for a few days.  Please Note:  You might notice some irritation and congestion in your nose or some drainage.  This is from the oxygen used during your procedure.  There is no need for concern and it should clear up in a day or so.  SYMPTOMS TO REPORT IMMEDIATELY:  Following lower endoscopy (colonoscopy or flexible sigmoidoscopy):  Excessive amounts of blood in the stool  Significant tenderness or worsening of abdominal pains  Swelling of the  abdomen that is new, acute  Fever of 100F or higher  For urgent or emergent issues, a gastroenterologist can be reached at any hour by calling [phone]. Do not use MyChart messaging for urgent concerns.    DIET:  We do recommend a small meal at first, but then you may proceed to your regular diet.  Drink plenty of fluids but you should avoid alcoholic beverages for 24 hours.  ACTIVITY:  You should plan to take it easy for the rest of today and you should NOT DRIVE or use heavy machinery until tomorrow (because of the sedation medicines used during the test).    FOLLOW UP: Our staff will call the number listed on your records the next business day following your procedure.  We will call around 7:15-  8:00 am to check on you and address any questions or concerns that you may have regarding the information given to you following your procedure. If we do not reach you, we will leave a message.     If any biopsies were taken you will be contacted by phone or by letter within the next 1-3 weeks.  Please call us at [phone] if you have not heard about the biopsies in 3 weeks.    SIGNATURES/CONFIDENTIALITY: You and/or your care partner have signed paperwork which will be entered into your electronic medical record.  These signatures attest to the fact that that the information above on your After Visit Summary has been reviewed and is understood.  Full responsibility of the confidentiality of this discharge information lies with you and/or your care-partner.

## 2022-09-30 NOTE — Progress Notes (Signed)
GASTROENTEROLOGY PROCEDURE H&P NOTE   Primary Care Physician: Jeoffrey Massed, MD    Reason for Procedure:   Colon cancer screening  Plan:    colonoscopy  Patient is appropriate for endoscopic procedure(s) in the ambulatory ([facility]) setting.  The nature of the procedure, as well as the risks, benefits, and alternatives were carefully and thoroughly reviewed with the patient. Ample time for discussion and questions allowed. The patient understood, was satisfied, and agreed to proceed.     HPI: Patrick Campbell is a 62 y.o. male who presents for screening colonoscopy.  Medical history as below.  Tolerated the prep.  No recent chest pain or shortness of breath.  No abdominal pain today.  Past Medical History:  Diagnosis Date   Acquired renal cyst of left kidney 11/2003   Complicated cyst vs solid mass: contrast enhanced CT was recommended but ?apparently not done?   CAD (coronary artery disease)    1 vessel on coronary CT, Calcium score 69th %'tile for age, recommended inc statin and start ASA (04/2020)   Chronic renal insufficiency, stage II (mild)    Borderline stage II/III (CrCl estimate @ low 60s)   COVID-19 virus detected 09/2018   congrstion and low fever x 2 and 1/2 weeks all symptoms resolved   Epididymal mass    left   History of gallstones    Lap chole 12/2018   History of kidney stones    ongoing L nephrolithiasis as of 08/2019 urol f/u   Hyperlipidemia    Hypertension    Injury 04/01/2020   right foot injury dropped weight on foot bruised and swollen using ice prn skin intact   Polycythemia 05/10/2010   EPO level normal-- IDIOPATHIC   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   MRI pituitary ordered but never done    Past Surgical History:  Procedure Laterality Date   CHOLECYSTECTOMY N/A 01/10/2019   Procedure: LAPAROSCOPIC CHOLECYSTECTOMY;  Surgeon: Abigail Miyamoto, MD;  Location: [facility];  Service: General;  Laterality: N/A;   COLONOSCOPY  09/30/2011    Normal-repeat in 10 yrs   CYSTOSCOPY WITH STENT PLACEMENT Left 05/29/2015   Procedure: CYSTOSCOPY WITH STENT PLACEMENT, RETROGRADE;  Surgeon: Malen Gauze, MD;  Location: [facility];  Service: Urology;  Laterality: Left;   CYSTOSCOPY/RETROGRADE/URETEROSCOPY/STONE EXTRACTION WITH BASKET Left 06/15/2015   Procedure: CYSTOSCOPY/RETROGRADE/URETEROSCOPY/STONE EXTRACTION WITH BASKET WITH STENT EXCHANGE;  Surgeon: Malen Gauze, MD;  Location: [facility];  Service: Urology;  Laterality: Left;   ETT     11/03/21 (Dr. Nena Jordan ischemia   HOLMIUM LASER APPLICATION Left 06/15/2015   Procedure: HOLMIUM LASER APPLICATION;  Surgeon: Malen Gauze, MD;  Location: [facility];  Service: Urology;  Laterality: Left;   HYDROCELE EXCISION Left 04/10/2020   Procedure: HYDROCELECTOMY ADULT;  Surgeon: Marcine Matar, MD;  Location: [facility];  Service: Urology;  Laterality: Left;   KNEE SURGERY Right 2002   arthroscopic   SCROTAL EXPLORATION Left 04/10/2020   benign L epididymal mass.  Procedure: INGUINAL EXPLORATION AND EXCISION OF EPIDIDYMAL MASS;  Surgeon: Marcine Matar, MD;  Location: [facility];  Service: Urology;  Laterality: Left;  1 HR    Prior to Admission medications   Medication Sig Start Date End Date Taking? Authorizing Provider  amLODipine (NORVASC) 10 MG tablet Take 1 tablet (10 mg total) by mouth daily. 10/01/21 12/19/31 Yes McGowen, Maryjean Morn, MD  lisinopril (ZESTRIL) 10 MG tablet Take 1 tablet (10 mg total) by mouth daily. 10/01/21  Yes McGowen, Maryjean Morn, MD  rosuvastatin (CRESTOR) 40 MG tablet Take 1 tablet (40 mg total) by mouth daily. 10/22/21  Yes Rollene Rotunda, MD  tamsulosin (FLOMAX) 0.4 MG CAPS capsule Take 0.4 mg by mouth daily. Patient not taking: Reported on 10/01/2021 01/10/21   [provider]  Testosterone 1.62 % GEL Apply 2 Pump topically daily. Patient not taking: Reported on 07/24/2022  09/04/21   [provider]    Current Outpatient Medications  Medication Sig Dispense Refill   amLODipine (NORVASC) 10 MG tablet Take 1 tablet (10 mg total) by mouth daily. 90 tablet 3   lisinopril (ZESTRIL) 10 MG tablet Take 1 tablet (10 mg total) by mouth daily. 90 tablet 3   rosuvastatin (CRESTOR) 40 MG tablet Take 1 tablet (40 mg total) by mouth daily. 90 tablet 3   tamsulosin (FLOMAX) 0.4 MG CAPS capsule Take 0.4 mg by mouth daily. (Patient not taking: Reported on 10/01/2021)     Testosterone 1.62 % GEL Apply 2 Pump topically daily. (Patient not taking: Reported on 07/24/2022)     Current Facility-Administered Medications  Medication Dose Route Frequency Provider Last Rate Last Admin   0.9 %  sodium chloride infusion  500 mL Intravenous Continuous Reice Bienvenue, Carie Caddy, MD        Allergies as of 09/30/2022   (No Known Allergies)    Family History  Problem Relation Age of Onset   Hypertension Mother    Food Allergy Mother    Cancer Father        lung/ smoker   Kidney disease Brother    HIV Brother    Colon cancer Neg Hx    Colon polyps Neg Hx    Esophageal cancer Neg Hx    Rectal cancer Neg Hx    Stomach cancer Neg Hx     Social History   Socioeconomic History   Marital status: Married    Spouse name: Not on file   Number of children: Not on file   Years of education: Not on file   Highest education level: Not on file  Occupational History   Not on file  Tobacco Use   Smoking status: Never   Smokeless tobacco: Never  Vaping Use   Vaping status: Never Used  Substance and Sexual Activity   Alcohol use: Yes    Comment: occ   Drug use: No   Sexual activity: Yes    Partners: Female  Other Topics Concern   Not on file  Social History Narrative   Married, 3 children (15, 20, 21 yrs).  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   From [place].  Was in [employer] x 10 yrs in [place], [place].   Works out.  Rare wine intake.  No cigs or drugs.   Social Determinants of Health    Financial Resource Strain: Not on file  Food Insecurity: Not on file  Transportation Needs: Not on file  Physical Activity: Not on file  Stress: Not on file  Social Connections: Unknown (05/25/2021)   Received from [facility]   Social Network    Social Network: Not on file  Intimate Partner Violence: Unknown (04/16/2021)   Received from [facility], [facility]   HITS    Physically Hurt: Not on file    Insult or Talk Down To: Not on file    Threaten Physical Harm: Not on file    Scream or Curse: Not on file    Physical Exam: Vital signs in last  24 hours: @BP  128/70   Pulse 71   Temp 98.6 F (37 C) (Skin)   Ht 6' (1.829 m)   Wt 205 lb (93 kg)   SpO2 96%   BMI 27.80 kg/m  GEN: NAD EYE: Sclerae anicteric ENT: MMM CV: Non-tachycardic Pulm: CTA b/l GI: Soft, NT/ND NEURO:  Alert & Oriented x 3   Erick Blinks, MD  Gastroenterology  09/30/2022 9:32 AM

## 2022-09-30 NOTE — Op Note (Signed)
[facility] Patient Name: Patrick Campbell Procedure Date: 09/30/2022 9:38 AM MRN: [id number] Endoscopist: Beverley Fiedler , MD, [id number] Age: 62 Referring MD:  Date of Birth: 05/20/1960 Gender: Male Account #: [id number] Procedure:                Colonoscopy Indications:              Screening for colorectal malignant neoplasm, Last                            colonoscopy: 2013 Medicines:                Monitored Anesthesia Care Procedure:                Pre-Anesthesia Assessment:                           - Prior to the procedure, a History and Physical                            was performed, and patient medications and                            allergies were reviewed. The patient's tolerance of                            previous anesthesia was also reviewed. The risks                            and benefits of the procedure and the sedation                            options and risks were discussed with the patient.                            All questions were answered, and informed consent                            was obtained. Prior Anticoagulants: The patient has                            taken no anticoagulant or antiplatelet agents. ASA                            Grade Assessment: III - A patient with severe                            systemic disease. After reviewing the risks and                            benefits, the patient was deemed in satisfactory                            condition to undergo the procedure.  After obtaining informed consent, the colonoscope                            was passed under direct vision. Throughout the                            procedure, the patient's blood pressure, pulse, and                            oxygen saturations were monitored continuously. The                            CF [id number] #[id number] was introduced through the anus                            and advanced to the cecum, identified  by                            appendiceal orifice and ileocecal valve. The                            colonoscopy was performed without difficulty. The                            patient tolerated the procedure well. The quality                            of the bowel preparation was good. The ileocecal                            valve, appendiceal orifice, and rectum were                            photographed. Scope In: 9:42:40 AM Scope Out: 10:00:09 AM Scope Withdrawal Time: 0 hours 15 minutes 25 seconds  Total Procedure Duration: 0 hours 17 minutes 29 seconds  Findings:                 The digital rectal exam was normal.                           Two sessile polyps were found in the cecum. The                            polyps were 3 to 5 mm in size. These polyps were                            removed with a cold snare. Resection and retrieval                            were complete.                           A 5 mm polyp was found in the ascending colon. The  polyp was sessile. The polyp was removed with a                            cold snare. Resection and retrieval were complete.                           Two sessile polyps were found in the sigmoid colon.                            The polyps were 4 to 5 mm in size. These polyps                            were removed with a cold snare. Resection and                            retrieval were complete.                           Internal hemorrhoids were found during                            retroflexion. The hemorrhoids were medium-sized. Complications:            No immediate complications. Estimated Blood Loss:     Estimated blood loss was minimal. Impression:               - Two 3 to 5 mm polyps in the cecum, removed with a                            cold snare. Resected and retrieved.                           - One 5 mm polyp in the ascending colon, removed                            with a  cold snare. Resected and retrieved.                           - Two 4 to 5 mm polyps in the sigmoid colon,                            removed with a cold snare. Resected and retrieved.                           - Internal hemorrhoids. Recommendation:           - Patient has a contact number available for                            emergencies. The signs and symptoms of potential                            delayed complications were discussed with the  patient. Return to normal activities tomorrow.                            Written discharge instructions were provided to the                            patient.                           - Resume previous diet.                           - Continue present medications.                           - Await pathology results.                           - Repeat colonoscopy is recommended. The                            colonoscopy date will be determined after pathology                            results from today's exam become available for                            review. Beverley Fiedler, MD 09/30/2022 10:03:10 AM This report has been signed electronically.

## 2022-09-30 NOTE — Progress Notes (Signed)
Called to room to assist during endoscopic procedure.  Patient ID and intended procedure confirmed with present staff. Received instructions for my participation in the procedure from the performing physician.  

## 2022-09-30 NOTE — Progress Notes (Signed)
Report given to PACU, vss 

## 2022-10-01 ENCOUNTER — Telehealth: Payer: Self-pay

## 2022-10-01 NOTE — Telephone Encounter (Signed)
  Follow up Call-     09/30/2022    8:42 AM 06/19/2022    9:02 AM  Call back number  Post procedure Call Back phone  # [phone]  Permission to leave phone message Yes      Patient questions:  Do you have a fever, pain , or abdominal swelling? No. Pain Score  0 *  Have you tolerated food without any problems? Yes.    Have you been able to return to your normal activities? Yes.    Do you have any questions about your discharge instructions: Diet   No. Medications  No. Follow up visit  No.  Do you have questions or concerns about your Care? No.  Actions: * If pain score is 4 or above: No action needed, pain <4.

## 2022-10-07 ENCOUNTER — Encounter: Payer: Self-pay | Admitting: Internal Medicine

## 2022-10-12 ENCOUNTER — Other Ambulatory Visit: Payer: Self-pay | Admitting: Family Medicine

## 2022-10-15 ENCOUNTER — Other Ambulatory Visit: Payer: Self-pay | Admitting: Family Medicine

## 2022-12-03 ENCOUNTER — Other Ambulatory Visit: Payer: Self-pay | Admitting: Cardiology

## 2023-03-04 ENCOUNTER — Other Ambulatory Visit: Payer: Self-pay | Admitting: Cardiology

## 2023-05-13 ENCOUNTER — Other Ambulatory Visit: Payer: Self-pay | Admitting: Cardiology

## 2023-09-04 LAB — HEPATIC FUNCTION PANEL
ALT: 26 U/L (ref 10–40)
AST: 20 (ref 14–40)
Alkaline Phosphatase: 62 (ref 25–125)
Bilirubin, Total: 0.4

## 2023-09-04 LAB — CBC AND DIFFERENTIAL
HCT: 59 — AB (ref 41–53)
Hemoglobin: 19 — AB (ref 13.5–17.5)
Neutrophils Absolute: 3.5
Platelets: 204 K/uL (ref 150–400)
WBC: 5.8

## 2023-09-04 LAB — BASIC METABOLIC PANEL WITH GFR
BUN: 18 (ref 4–21)
CO2: 20 (ref 13–22)
Chloride: 106 (ref 99–108)
Creatinine: 1.3 (ref 0.6–1.3)
Glucose: 82
Potassium: 4.5 meq/L (ref 3.5–5.1)
Sodium: 142 (ref 137–147)

## 2023-09-04 LAB — LIPID PANEL
Cholesterol: 192 (ref 0–200)
Cholesterol: 223 — AB (ref 0–200)
HDL: 51 (ref 35–70)
HDL: 68 (ref 35–70)
LDL Cholesterol: 106
LDL Cholesterol: 149
LDl/HDL Ratio: 2.9
Triglycerides: 131 (ref 40–160)
Triglycerides: 99 (ref 40–160)

## 2023-09-04 LAB — VITAMIN D 25 HYDROXY (VIT D DEFICIENCY, FRACTURES): Vit D, 25-Hydroxy: 30.4

## 2023-09-04 LAB — HEMOGLOBIN A1C
Hemoglobin A1C: 5.5
Hemoglobin A1C: 5.7

## 2023-09-04 LAB — COMPREHENSIVE METABOLIC PANEL WITH GFR
Albumin: 4.8 (ref 3.5–5.0)
Calcium: 10 (ref 8.7–10.7)
Globulin: 2.9
eGFR: 61

## 2023-09-04 LAB — TESTOSTERONE: Testosterone: 254

## 2023-09-04 LAB — PSA
PSA: 2.7
PSA: 3.8

## 2023-09-04 LAB — TSH: TSH: 1.27 (ref 0.41–5.90)

## 2023-09-04 LAB — CBC: RBC: 6.35 — AB (ref 3.87–5.11)

## 2023-09-24 ENCOUNTER — Encounter: Payer: Self-pay | Admitting: Family Medicine

## 2023-09-24 ENCOUNTER — Ambulatory Visit: Admitting: Family Medicine

## 2023-09-24 VITALS — BP 110/67 | HR 66 | Temp 98.1°F | Ht 72.0 in | Wt 217.8 lb

## 2023-09-24 DIAGNOSIS — E7800 Pure hypercholesterolemia, unspecified: Secondary | ICD-10-CM | POA: Diagnosis not present

## 2023-09-24 DIAGNOSIS — Z0000 Encounter for general adult medical examination without abnormal findings: Secondary | ICD-10-CM

## 2023-09-24 DIAGNOSIS — R972 Elevated prostate specific antigen [PSA]: Secondary | ICD-10-CM

## 2023-09-24 DIAGNOSIS — I10 Essential (primary) hypertension: Secondary | ICD-10-CM

## 2023-09-24 DIAGNOSIS — Z125 Encounter for screening for malignant neoplasm of prostate: Secondary | ICD-10-CM

## 2023-09-24 MED ORDER — ROSUVASTATIN CALCIUM 20 MG PO TABS: 20.0000 mg | ORAL_TABLET | Freq: Every day | ORAL | 3 refills | Status: AC

## 2023-09-24 NOTE — Progress Notes (Unsigned)
 Office Note 09/27/2023  CC:  Chief Complaint  Patient presents with   Medical Management of Chronic Issues    HPI:  Patient is a 63 y.o. male who is here for annual health maintenance exam and follow-up hypertension and hypercholesterolemia. I last saw him back in 2023.  Feeling well. Exhausted mentally and emotionally with his job. He has been getting general medical attention from the nurse practitioner at his employer. She has maintained him on his blood pressure medications and cholesterol medication as well as treated him with testosterone  gel at 1 point. Recently his lab work showed that his assay had risen over 1 point in the last year. He notes that he has not been on any testosterone  in about a year. Otherwise his labs showed mild cholesterol elevation and low testosterone  but otherwise good.  (Labs 09/08/2023: WBC 5.8, hemoglobin 16.9, platelets 204 K. Glucose 82, BUN 18, creatinine 1.32, GFR 61, sodium 142, potassium 4.5, chloride 106, CO2 20, calcium  10.0, total protein 7.7, albumin 4.8, total bilirubin 0.4, alkaline phosphatase 62, AST 20, ALT 26. Total cholesterol 223, triglycerides 131, HDL 51, LDL 149. Testosterone  254, hemoglobin A1c 5.5%. PSA 3.8. Vitamin D  23.5, TSH 1.27.  PMP AWARE reviewed today: most recent rx for testosterone  gel was filled 02/12/2023, # 75 mL, rx by Silvano Ka, FNP. No red flags.  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  09/2022 adenoma x 3   CYSTOSCOPY WITH STENT PLACEMENT Left 05/29/2015   Procedure: CYSTOSCOPY WITH STENT PLACEMENT, RETROGRADE;  Surgeon: Belvie LITTIE Clara, MD;  Location: [facility];  Service: Urology;  Laterality: Left;   CYSTOSCOPY/RETROGRADE/URETEROSCOPY/STONE EXTRACTION WITH BASKET Left 06/15/2015   Procedure: CYSTOSCOPY/RETROGRADE/URETEROSCOPY/STONE EXTRACTION WITH BASKET WITH STENT EXCHANGE;  Surgeon: Belvie LITTIE Clara, MD;  Location: [facility];  Service: Urology;  Laterality: Left;   ETT     11/03/21 (Dr. Bettyann ischemia   HOLMIUM LASER APPLICATION Left 06/15/2015   Procedure: HOLMIUM LASER APPLICATION;  Surgeon: Belvie LITTIE Clara, MD;  Location: [facility];  Service: Urology;  Laterality: Left;   HYDROCELE EXCISION Left 04/10/2020   Procedure: HYDROCELECTOMY ADULT;  Surgeon: Matilda Senior, MD;  Location: [facility];  Service: Urology;  Laterality: Left;   KNEE SURGERY Right 2002   arthroscopic   SCROTAL EXPLORATION Left 04/10/2020   benign L epididymal mass.  Procedure: INGUINAL EXPLORATION AND EXCISION OF EPIDIDYMAL MASS;  Surgeon: Matilda Senior, MD;  Location: [facility];  Service: Urology;  Laterality:  Left;  1 HR    Family History  Problem Relation Age of Onset   Hypertension Mother    Food Allergy  Mother    Cancer Father        lung/ smoker   Kidney disease Brother    HIV Brother    Colon cancer Neg Hx    Colon polyps Neg Hx    Esophageal cancer Neg Hx    Rectal cancer Neg Hx    Stomach cancer Neg Hx     Social History    Socioeconomic History   Marital status: Married    Spouse name: Not on file   Number of children: Not on file   Years of education: Not on file   Highest education level: Not on file  Occupational History   Not on file  Tobacco Use   Smoking status: Never   Smokeless tobacco: Never  Vaping Use   Vaping status: Never Used  Substance and Sexual Activity   Alcohol use: Yes    Comment: occ   Drug use: No   Sexual activity: Yes    Partners: Female  Other Topics Concern   Not on file  Social History Narrative   Married, 3 children (15, 20, 21 yrs).  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   Works out.  Rare wine intake.  No cigs or drugs.   Social Drivers of Corporate investment banker Strain: Not on file  Food Insecurity: Not on file  Transportation Needs: Not on file  Physical Activity: Not on file  Stress: Not on file  Social Connections: Unknown (05/25/2021)   [facility]   Social Network    Social Network: Not on file  Intimate Partner Violence: Unknown (04/16/2021)   Received from [facility]   HITS    Physically Hurt: Not on file    Insult or Talk Down To: Not on file    Threaten Physical Harm: Not on file    Scream or Curse: Not on file    Outpatient Medications Prior to Visit  Medication Sig Dispense Refill   amLODipine  (NORVASC ) 10 MG tablet Take 1 tablet (10 mg total) by mouth daily. 90 tablet 3   Azelastine HCl 137 MCG/SPRAY SOLN Place into both nostrils.     Cyanocobalamin (VITAMIN B-12 PO) Take by mouth daily.     lisinopril  (ZESTRIL ) 10 MG tablet Take 1 tablet (10 mg total) by mouth daily. 90 tablet 3   tamsulosin  (FLOMAX ) 0.4 MG CAPS capsule Take 0.4 mg by mouth daily.     rosuvastatin  (CRESTOR ) 40 MG tablet Take 1 tablet (40 mg total) by mouth daily. Please call office to schedule an appt for further refills. Thank you 7 tablet 0   Testosterone  1.62 % GEL Apply 2 Pump topically  daily. (Patient not taking: Reported on 09/24/2023)     No facility-administered medications prior to visit.    No Known Allergies  Review of Systems  Constitutional:  Negative for appetite change, chills, fatigue and fever.  HENT:  Negative for congestion, dental problem, ear pain and sore throat.   Eyes:  Negative for discharge, redness and visual disturbance.  Respiratory:  Negative for cough, chest tightness, shortness of breath and wheezing.   Cardiovascular:  Negative for chest pain, palpitations and leg swelling.  Gastrointestinal:  Negative for abdominal pain, blood in stool, diarrhea, nausea and vomiting.  Genitourinary:  Negative for  difficulty urinating, dysuria, flank pain, frequency, hematuria and urgency.  Musculoskeletal:  Negative for arthralgias, back pain, joint swelling, myalgias and neck stiffness.  Skin:  Negative for pallor and rash.  Neurological:  Negative for dizziness, speech difficulty, weakness and headaches.  Hematological:  Negative for adenopathy. Does not bruise/bleed easily.  Psychiatric/Behavioral:  Negative for confusion and sleep disturbance. The patient is not nervous/anxious.     PE;    09/24/2023    3:15 PM 09/30/2022   10:23 AM 09/30/2022   10:13 AM  Vitals with BMI  Height 6' 0    Weight 217 lbs 13 oz    BMI 29.53    Systolic 110 121 872  Diastolic 67 68 81  Pulse 66 57 63    Gen: Alert, well appearing.  Patient is oriented to person, place, time, and situation. AFFECT: pleasant, lucid thought and speech. ENT: Ears: EACs clear, normal epithelium.  TMs with good light reflex and landmarks bilaterally.  Eyes: no injection, icteris, swelling, or exudate.  EOMI, PERRLA. Nose: no drainage or turbinate edema/swelling.  No injection or focal lesion.  Mouth: lips without lesion/swelling.  Oral mucosa pink and moist.  Dentition intact and without obvious caries or gingival swelling.  Oropharynx without erythema, exudate, or swelling.  Neck:  supple/nontender.  No LAD, mass, or TM.  Carotid pulses 2+ bilaterally, without bruits. CV: RRR, no m/r/g.   LUNGS: CTA bilat, nonlabored resps, good aeration in all lung fields. ABD: soft, NT, ND, BS normal.  No hepatospenomegaly or mass.  No bruits. EXT: no clubbing, cyanosis, or edema.  Musculoskeletal: no joint swelling, erythema, warmth, or tenderness.  ROM of all joints intact. Skin - no sores or suspicious lesions or rashes or color changes Rectal exam: negative without mass, lesions or tenderness, PROSTATE EXAM: smooth and symmetric without nodules or tenderness.  Pertinent labs:  Lab Results  Component Value Date   TSH 1.27 09/04/2023   Lab Results  Component Value Date   WBC 5.8 09/04/2023   HGB 19.0 (A) 09/04/2023   HCT 59 (A) 09/04/2023   MCV 90.4 10/02/2021   PLT 204 09/04/2023   Lab Results  Component Value Date   CREATININE 1.3 09/04/2023   BUN 18 09/04/2023   NA 142 09/04/2023   K 4.5 09/04/2023   CL 106 09/04/2023   CO2 20 09/04/2023   Lab Results  Component Value Date   ALT 26 09/04/2023   AST 20 09/04/2023   ALKPHOS 62 09/04/2023   BILITOT 0.5 10/02/2021   Lab Results  Component Value Date   CHOL 192 09/04/2023   Lab Results  Component Value Date   HDL 68 09/04/2023   Lab Results  Component Value Date   LDLCALC 106 09/04/2023   Lab Results  Component Value Date   TRIG 99 09/04/2023   Lab Results  Component Value Date   CHOLHDL 4 10/02/2021   Lab Results  Component Value Date   PSA 2.7 09/04/2023   PSA 3.15 10/02/2021   PSA 2.38 04/05/2020   Lab Results  Component Value Date   HGBA1C 5.7 09/04/2023   ASSESSMENT AND PLAN:   #1 health maintenance exam: Reviewed age and gender appropriate health maintenance issues (prudent diet, regular exercise, health risks of tobacco and excessive alcohol, use of seatbelts, fire alarms in home, use of sunscreen).  Also reviewed age and gender appropriate health screening as well as vaccine  recommendations. Vaccines: Shingrix->declined.  Tdap->declined.  Prevnar->declined. Labs: none (see hpi) Prostate ca screening:  Colon ca screening: Multiple adenomatous polyps on colonoscopy 09/30/2022.  2.  Hypertension, well-controlled on amlodipine  10 mg a day and lisinopril  10 mg a day. Recent electrolytes normal, serum creatinine at his baseline of 1.3.  3.  Hypercholesterolemia. He got off of his rosuvastatin  for a while.  I record shows an incorrect dose listed on his chart currently.  He says he takes 2 of the 10 mg rosuvastatin  tabs daily but he just restarted this a couple of weeks ago.  #4 increased PSA velocity. PSA rose from 2.7-3.8 (while NOT on testosterone  supplement). Referred to urology for recommendations.  An After Visit Summary was printed and given to the patient.  FOLLOW UP:  Return in about 6 months (around 03/23/2024) for routine chronic illness f/u.  Signed:  Gerlene Hockey, MD           09/27/2023

## 2023-12-08 ENCOUNTER — Encounter (HOSPITAL_BASED_OUTPATIENT_CLINIC_OR_DEPARTMENT_OTHER): Payer: Self-pay

## 2023-12-08 ENCOUNTER — Other Ambulatory Visit: Payer: Self-pay

## 2023-12-08 ENCOUNTER — Emergency Department (HOSPITAL_BASED_OUTPATIENT_CLINIC_OR_DEPARTMENT_OTHER)
Admission: EM | Admit: 2023-12-08 | Discharge: 2023-12-08 | Disposition: A | Discharge status: Home / Self Care | Attending: Emergency Medicine | Admitting: Emergency Medicine

## 2023-12-08 ENCOUNTER — Emergency Department (HOSPITAL_BASED_OUTPATIENT_CLINIC_OR_DEPARTMENT_OTHER)

## 2023-12-08 DIAGNOSIS — R112 Nausea with vomiting, unspecified: Secondary | ICD-10-CM | POA: Diagnosis present

## 2023-12-08 DIAGNOSIS — R748 Abnormal levels of other serum enzymes: Secondary | ICD-10-CM | POA: Diagnosis not present

## 2023-12-08 DIAGNOSIS — K6389 Other specified diseases of intestine: Secondary | ICD-10-CM

## 2023-12-08 DIAGNOSIS — K659 Peritonitis, unspecified: Secondary | ICD-10-CM | POA: Diagnosis not present

## 2023-12-08 LAB — CBC
HCT: 50.1 % (ref 39.0–52.0)
Hemoglobin: 16.1 g/dL (ref 13.0–17.0)
MCH: 29.8 pg (ref 26.0–34.0)
MCHC: 32.1 g/dL (ref 30.0–36.0)
MCV: 92.8 fL (ref 80.0–100.0)
Platelets: 175 K/uL (ref 150–400)
RBC: 5.4 MIL/uL (ref 4.22–5.81)
RDW: 12.9 % (ref 11.5–15.5)
WBC: 4.8 K/uL (ref 4.0–10.5)
nRBC: 0 % (ref 0.0–0.2)

## 2023-12-08 LAB — COMPREHENSIVE METABOLIC PANEL WITH GFR
ALT: 124 U/L — ABNORMAL HIGH (ref 0–44)
AST: 52 U/L — ABNORMAL HIGH (ref 15–41)
Albumin: 4.4 g/dL (ref 3.5–5.0)
Alkaline Phosphatase: 90 U/L (ref 38–126)
Anion gap: 11 (ref 5–15)
BUN: 14 mg/dL (ref 8–23)
CO2: 22 mmol/L (ref 22–32)
Calcium: 9.6 mg/dL (ref 8.9–10.3)
Chloride: 105 mmol/L (ref 98–111)
Creatinine, Ser: 1.11 mg/dL (ref 0.61–1.24)
GFR, Estimated: >60 mL/min (ref >60)
Glucose, Bld: 88 mg/dL (ref 70–99)
Potassium: 4.1 mmol/L (ref 3.5–5.1)
Sodium: 138 mmol/L (ref 135–145)
Total Bilirubin: 0.4 mg/dL (ref 0.0–1.2)
Total Protein: 7.7 g/dL (ref 6.5–8.1)

## 2023-12-08 LAB — URINALYSIS, ROUTINE W REFLEX MICROSCOPIC
Bacteria, UA: NONE SEEN
Bilirubin Urine: NEGATIVE
Glucose, UA: NEGATIVE mg/dL
Ketones, ur: NEGATIVE mg/dL
Leukocytes,Ua: NEGATIVE
Nitrite: NEGATIVE
RBC / HPF: >50 RBC/hpf (ref 0–5)
Specific Gravity, Urine: 1.022 (ref 1.005–1.030)
pH: 5.5 (ref 5.0–8.0)

## 2023-12-08 LAB — C DIFFICILE QUICK SCREEN W PCR REFLEX
C Diff antigen: NEGATIVE
C Diff interpretation: NOT DETECTED
C Diff toxin: NEGATIVE

## 2023-12-08 LAB — LIPASE, BLOOD: Lipase: 98 U/L — ABNORMAL HIGH (ref 11–51)

## 2023-12-08 LAB — OCCULT BLOOD X 1 CARD TO LAB, STOOL: Fecal Occult Bld: NEGATIVE

## 2023-12-08 MED ORDER — NAPROXEN 500 MG PO TABS
500.0000 mg | ORAL_TABLET | Freq: Two times a day (BID) | ORAL | 0 refills | Status: AC
Start: 2023-12-08 — End: ?

## 2023-12-08 MED ORDER — IOHEXOL 300 MG/ML  SOLN
100.0000 mL | Freq: Once | INTRAMUSCULAR | Status: AC | PRN
  Administered 2023-12-08: 100 mL via INTRAVENOUS

## 2023-12-08 MED ORDER — SODIUM CHLORIDE 0.9 % IV BOLUS
500.0000 mL | Freq: Once | INTRAVENOUS | Status: AC
  Administered 2023-12-08: 500 mL via INTRAVENOUS

## 2023-12-08 MED ORDER — FAMOTIDINE IN NACL 20-0.9 MG/50ML-% IV SOLN
20.0000 mg | Freq: Once | INTRAVENOUS | Status: AC
  Administered 2023-12-08: 20 mg via INTRAVENOUS
  Filled 2023-12-08: qty 50

## 2023-12-08 MED ORDER — ONDANSETRON HCL 4 MG PO TABS: 4.0000 mg | ORAL_TABLET | Freq: Four times a day (QID) | ORAL | 0 refills | Status: AC

## 2023-12-08 MED ORDER — ONDANSETRON HCL 4 MG/2ML IJ SOLN
4.0000 mg | Freq: Once | INTRAMUSCULAR | Status: AC
  Administered 2023-12-08: 4 mg via INTRAVENOUS
  Filled 2023-12-08: qty 2

## 2023-12-08 MED ORDER — KETOROLAC TROMETHAMINE 30 MG/ML IJ SOLN
30.0000 mg | Freq: Once | INTRAMUSCULAR | Status: AC
  Administered 2023-12-08: 30 mg via INTRAVENOUS
  Filled 2023-12-08: qty 1

## 2023-12-08 MED ORDER — OMEPRAZOLE 20 MG PO CPDR: 20.0000 mg | DELAYED_RELEASE_CAPSULE | Freq: Every day | ORAL | 0 refills | Status: DC

## 2023-12-08 MED ORDER — HYDROCODONE-ACETAMINOPHEN 5-325 MG PO TABS: 1.0000 | ORAL_TABLET | Freq: Four times a day (QID) | ORAL | 0 refills | Status: DC | PRN

## 2023-12-08 NOTE — ED Provider Notes (Signed)
 [place] EMERGENCY DEPARTMENT AT [facility] Provider Note   CSN: [id number] Arrival date & time: 12/08/23  0830     Patient presents with: Melena   Patrick Campbell is a 63 y.o. male.   HPI   63 year old male presents emergency department concern for bloody bowel movements now with black diarrhea as well as nausea/vomiting.  This has been ongoing for the past week.  He states stools were originally bloody with bright red blood, clots and now has transitioned to black watery like diarrhea.  Last colonoscopy was about a year ago, did have polyps removed, mention of internal hemorrhoids.  He is not on any blood thinning medicine.  Has generalized abdominal discomfort which is now translate into upper abdominal cramping with nausea/vomiting.  Denies any fever.  No chest pain.  No pain with urination or noted blood in the urine.  Prior to Admission medications   Medication Sig Start Date End Date Taking? Authorizing Provider  HYDROcodone -acetaminophen  (NORCO/VICODIN) 5-325 MG tablet Take 1 tablet by mouth every 6 (six) hours as needed. 12/08/23  Yes Kennedee Kitzmiller M, DO  naproxen  (NAPROSYN ) 500 MG tablet Take 1 tablet (500 mg total) by mouth 2 (two) times daily with a meal. 12/08/23  Yes Lenise Jr M, DO  omeprazole  (PRILOSEC) 20 MG capsule Take 1 capsule (20 mg total) by mouth daily. 12/08/23  Yes Kathaleya Mcduffee, Roxie HERO, DO  ondansetron  (ZOFRAN ) 4 MG tablet Take 1 tablet (4 mg total) by mouth every 6 (six) hours. 12/08/23  Yes Trisha Ken, Roxie HERO, DO  amLODipine  (NORVASC ) 10 MG tablet Take 1 tablet (10 mg total) by mouth daily. 10/01/21 12/19/31  McGowen, Aleene DEL, MD  Azelastine HCl 137 MCG/SPRAY SOLN Place into both nostrils. 09/17/23   [provider]  Cyanocobalamin (VITAMIN B-12 PO) Take by mouth daily.    [provider]  lisinopril  (ZESTRIL ) 10 MG tablet Take 1 tablet (10 mg total) by mouth daily. 10/01/21   McGowen, Aleene DEL, MD  rosuvastatin  (CRESTOR ) 20 MG  tablet Take 1 tablet (20 mg total) by mouth daily. 09/24/23   McGowen, Aleene DEL, MD  tamsulosin  (FLOMAX ) 0.4 MG CAPS capsule Take 0.4 mg by mouth daily. 01/10/21   [provider]  Testosterone  1.62 % GEL Apply 2 Pump topically daily. Patient not taking: Reported on 09/24/2023 09/04/21   [provider]    Allergies: Cherry    Review of Systems  Constitutional:  Positive for chills. Negative for fever.  Respiratory:  Negative for shortness of breath.   Cardiovascular:  Negative for chest pain.  Gastrointestinal:  Positive for abdominal pain, blood in stool, diarrhea, nausea and vomiting.  Skin:  Negative for rash.  Neurological:  Negative for headaches.    Updated Vital Signs BP (!) 133/90   Pulse 64   Temp 97.6 F (36.4 C) (Oral)   Resp 16   Ht 6' (1.829 m)   Wt 95.3 kg   SpO2 97%   BMI 28.48 kg/m   Physical Exam Vitals and nursing note reviewed.  Constitutional:      General: He is not in acute distress.    Appearance: Normal appearance.  HENT:     Head: Normocephalic.     Mouth/Throat:     Mouth: Mucous membranes are moist.  Cardiovascular:     Rate and Rhythm: Normal rate.  Pulmonary:     Effort: Pulmonary effort is normal. No respiratory distress.  Abdominal:     General: Bowel sounds are normal.  Palpations: Abdomen is soft.     Tenderness: There is abdominal tenderness. There is no guarding or rebound.  Skin:    General: Skin is warm.  Neurological:     Mental Status: He is alert and oriented to person, place, and time. Mental status is at baseline.  Psychiatric:        Mood and Affect: Mood normal.     (all labs ordered are listed, but only abnormal results are displayed) Labs Reviewed  COMPREHENSIVE METABOLIC PANEL WITH GFR - Abnormal; Notable for the following components:      Result Value   AST 52 (*)    ALT 124 (*)    All other components within normal limits  LIPASE, BLOOD - Abnormal; Notable for the following components:    Lipase 98 (*)    All other components within normal limits  URINALYSIS, ROUTINE W REFLEX MICROSCOPIC - Abnormal; Notable for the following components:   Hgb urine dipstick LARGE (*)    Protein, ur TRACE (*)    All other components within normal limits  C DIFFICILE QUICK SCREEN W PCR REFLEX    STOOL CULTURE  OVA + PARASITE EXAM  CBC  OCCULT BLOOD X 1 CARD TO LAB, STOOL  POC OCCULT BLOOD, ED    EKG: None  Radiology: CT ABDOMEN PELVIS W CONTRAST Result Date: 12/08/2023 CLINICAL DATA:  Blood in stool.  Vomiting.  Abdominal pain. EXAM: CT ABDOMEN AND PELVIS WITH CONTRAST TECHNIQUE: Multidetector CT imaging of the abdomen and pelvis was performed using the standard protocol following bolus administration of intravenous contrast. RADIATION DOSE REDUCTION: This exam was performed according to the departmental dose-optimization program which includes automated exposure control, adjustment of the mA and/or kV according to patient size and/or use of iterative reconstruction technique. CONTRAST:  OMNIPAQUE  IOHEXOL  300 MG/ML  SOLN COMPARISON:  CT urogram 09/26/2021. FINDINGS: Lower chest: Subsegmental atelectasis noted right lung base. Hepatobiliary: No suspicious focal abnormality within the liver parenchyma. Gallbladder is surgically absent. 10 mm calcified stone again identified in the region of the porta hepatis, potentially in the cystic duct or common duct without intra or extrahepatic biliary duct dilatation. Pancreas: No focal mass lesion. No dilatation of the main duct. No intraparenchymal cyst. No peripancreatic edema. Spleen: No splenomegaly. No suspicious focal mass lesion. Adrenals/Urinary Tract: No adrenal nodule or mass. Bilateral simple renal cysts again noted. Tiny well-defined homogeneous low-density lesions in both kidneys are too small to characterize but are statistically most likely benign and probably cysts. No followup imaging is recommended. No overtly suspicious renal mass  lesion. No hydroureteronephrosis. Bladder is decompressed. Stomach/Bowel: Stomach is unremarkable. No gastric wall thickening. No evidence of outlet obstruction. Duodenum is normally positioned as is the ligament of Treitz. No small bowel wall thickening. No small bowel dilatation. The terminal ileum is normal. The appendix is normal. Focal ring-like area of pericolonic edema/inflammation is associated with the proximal descending colon (axial 36/2 and coronal 64/5). A second similar area seen in the distal descending colon on axial 59/2 and coronal 71/5. colon otherwise unremarkable in largely free of diverticular disease. Vascular/Lymphatic: No abdominal aortic aneurysm. No abdominal lymphadenopathy No pelvic sidewall lymphadenopathy. Reproductive: Prostate gland is enlarged. Other: No intraperitoneal free fluid. Musculoskeletal: No worrisome lytic or sclerotic osseous abnormality. IMPRESSION: 1. Focal ring-like area of pericolonic edema/inflammation is associated with the proximal descending colon and a second similar area seen in the distal descending colon. Imaging features are most suggestive of epiploic appendagitis although multifocal involvement is somewhat unusual.  No appreciable diverticular disease in these regions on previous imaging to suggest multifocal diverticulitis as an etiology. No colonic wall thickening to suggest colitis. Given the multifocal involvement, GI follow-up likely warranted 2. 10 mm calcified stone in the region of the porta hepatis, potentially in the cystic duct or common duct without intra or extrahepatic biliary duct dilatation. This is unchanged in the interval since the prior study. 3. Prostatomegaly. Electronically Signed   By: Camellia Candle M.D.   On: 12/08/2023 10:47     Procedures   Medications Ordered in the ED  sodium chloride  0.9 % bolus 500 mL (0 mLs Intravenous Stopped 12/08/23 1153)  ondansetron  (ZOFRAN ) injection 4 mg (4 mg Intravenous Given 12/08/23 1054)   ketorolac  (TORADOL ) 30 MG/ML injection 30 mg (30 mg Intravenous Given 12/08/23 1054)  famotidine  (PEPCID ) IVPB 20 mg premix (0 mg Intravenous Stopped 12/08/23 1144)  iohexol  (OMNIPAQUE ) 300 MG/ML solution 100 mL (100 mLs Intravenous Contrast Given 12/08/23 1029)                                    Medical Decision Making Amount and/or Complexity of Data Reviewed Labs: ordered. Radiology: ordered.  Risk Prescription drug management.   63 year old male presents emergency department after a week of bloody bowel movements now with black/watery diarrhea and upper abdominal discomfort with nausea/vomiting.  Denies any fever.  Abdomen is diffusely and slightly tender, no guarding.  Blood work is reassuring outside of a mild transaminitis and a lipase of 98.  Urinalysis has blood but no infection.  Patient was able to give a stool sample, stool studies are pending, occult was negative.  CT of the abdomen pelvis does not show any visual findings of pancreatitis but does note areas that look consistent with epiploic appendagitis.  After medications patient feels much improved.  I discussed with the patient the self-limiting process of epiploic appendagitis and supportive management like NSAIDs.  He understands and agrees.  Will also send some stronger medicine in regard to possible mild pancreatitis.  Will also prescribe nausea medicine and plan for supportive treatment and ambulatory referral to gastroenterology for further evaluation and treatment if needed.  Patient at this time appears safe and stable for discharge and close outpatient follow up. Discharge plan and strict return to ED precautions discussed, patient verbalizes understanding and agreement.     Final diagnoses:  Epiploic appendagitis  Elevated lipase    ED Discharge Orders          Ordered    Ambulatory referral to Gastroenterology       Comments: Bloody bowel movements, chronic diarrhea   12/08/23 1249    naproxen   (NAPROSYN ) 500 MG tablet  2 times daily with meals        12/08/23 1252    HYDROcodone -acetaminophen  (NORCO/VICODIN) 5-325 MG tablet  Every 6 hours PRN        12/08/23 1252    ondansetron  (ZOFRAN ) 4 MG tablet  Every 6 hours        12/08/23 1252    omeprazole  (PRILOSEC) 20 MG capsule  Daily        12/08/23 1252               Ersel Wadleigh M, DO 12/08/23 1257

## 2023-12-08 NOTE — ED Triage Notes (Signed)
 Blood stool onset 1 week ago. Started vomiting Saturday morning. Had abnormal kidney labs at PCP, he was referred to urology and GI. Saw urology last week but GI has not called for appt yet. Denies fever.

## 2023-12-08 NOTE — Discharge Instructions (Signed)
 You have been seen and discharged from the emergency department.  Your blood work showed an elevated lipase, could be reflective of a mild pancreatitis.  Your CT scan identified epiploic appendagitis which is inflammation of the attachments around the colon.  This is self-limited and should resolve on its own.  Treatment for this is anti-inflammatory pain medicine, take prescription as directed.  You have also been prescribed stronger pain medicine to take as needed.  Do not mix this medication with alcohol or other sedating medications. Do not drive or do heavy physical activity until you know how this medication affects you.  It may cause drowsiness.  An ambulatory referral to gastroenterology has been placed.  Is important to follow-up with them to ensure resolution of inflammation and undergo further testing.  Your stool studies are still pending but should result in the next couple days on MyChart.  Follow-up with your primary provider for further evaluation and further care. Take home medications as prescribed. If you have any worsening symptoms or further concerns for your health please return to an emergency department for further evaluation.

## 2023-12-09 NOTE — Transitions of Care (Post Inpatient/ED Visit) (Signed)
   12/09/2023  Name: Patrick Campbell MRN: [id number] DOB: 04-18-1960  Today's TOC FU Call Status: Today's TOC FU Call Status:: Successful TOC FU Call Completed TOC FU Call Complete Date: 12/09/23  Patient's Name and Date of Birth confirmed. Name, DOB  Transition Care Management Follow-up Telephone Call Date of Discharge: 12/08/23 Discharge Facility: [facility] ([facility]) Type of Discharge: Emergency Department How have you been since you were released from the hospital?: Better Any questions or concerns?: No  Items Reviewed: Did you receive and understand the discharge instructions provided?: Yes Medications obtained,verified, and reconciled?: Yes (Medications Reviewed) Any new allergies since your discharge?: No Dietary orders reviewed?: NA Do you have support at home?: Yes People in Home [RPT]: spouse  Medications Reviewed Today: Medications Reviewed Today     Reviewed by Claudene Shanda ORN, CMA (Certified Medical Assistant) on 12/09/23 at 0818  Med List Status: <None>   Medication Order Taking? Sig Documenting Provider Last Dose Status Informant  amLODipine  (NORVASC ) 10 MG tablet [id number]  Take 1 tablet (10 mg total) by mouth daily. McGowen, Philip H, MD  Active   Azelastine HCl 137 MCG/SPRAY SOLN [id number]  Place into both nostrils. [provider]  Active   Cyanocobalamin (VITAMIN B-12 PO) [id number]  Take by mouth daily. [provider]  Active   HYDROcodone -acetaminophen  (NORCO/VICODIN) 5-325 MG tablet [id number]  Take 1 tablet by mouth every 6 (six) hours as needed. Horton, Kristie M, DO  Active   lisinopril  (ZESTRIL ) 10 MG tablet [id number]  Take 1 tablet (10 mg total) by mouth daily. McGowen, Philip H, MD  Active   naproxen  (NAPROSYN ) 500 MG tablet [id number]  Take 1 tablet (500 mg total) by mouth 2 (two) times daily with a meal. Horton, Kristie M, DO  Active   omeprazole  (PRILOSEC) 20 MG capsule [id number]  Take 1 capsule (20 mg total) by mouth daily.  Horton, Kristie M, DO  Active   ondansetron  (ZOFRAN ) 4 MG tablet [id number]  Take 1 tablet (4 mg total) by mouth every 6 (six) hours. Horton, Kristie M, DO  Active   rosuvastatin  (CRESTOR ) 20 MG tablet [id number]  Take 1 tablet (20 mg total) by mouth daily. McGowen, Philip H, MD  Active   tamsulosin  (FLOMAX ) 0.4 MG CAPS capsule [id number]  Take 0.4 mg by mouth daily. [provider]  Active   Testosterone  1.62 % GEL [id number]  Apply 2 Pump topically daily.  Patient not taking: Reported on 09/24/2023   [provider]  Active             Home Care and Equipment/Supplies: Were Home Health Services Ordered?: NA Any new equipment or medical supplies ordered?: NA  Functional Questionnaire: Do you need assistance with bathing/showering or dressing?: No Do you need assistance with meal preparation?: No Do you need assistance with eating?: No Do you have difficulty maintaining continence: No Do you need assistance with getting out of bed/getting out of a chair/moving?: No Do you have difficulty managing or taking your medications?: No  Follow up appointments reviewed: PCP Follow-up appointment confirmed?: No MD Provider Line Number:[phone] Given: Yes Specialist Hospital Follow-up appointment confirmed?: NA Do you need transportation to your follow-up appointment?: No Do you understand care options if your condition(s) worsen?: Yes-patient verbalized understanding    SIGNATURE Shanda Claudene

## 2023-12-10 LAB — OVA + PARASITE EXAM

## 2023-12-10 LAB — O&P RESULT

## 2023-12-12 LAB — STOOL CULTURE: E coli, Shiga toxin Assay: NEGATIVE

## 2023-12-12 LAB — STOOL CULTURE REFLEX - RSASHR

## 2023-12-12 LAB — STOOL CULTURE REFLEX - CMPCXR

## 2024-01-25 NOTE — Progress Notes (Unsigned)
 "    01/25/2024 Patrick Campbell [id number] 11/17/1960   Chief Complaint:  History of Present Illness: Patrick Campbell is a 64 year old male with a past medical history of coronary artery disease and colon polyps. He is known by Dr. Albertus.   He presented to the ED 12/08/2023 with N/V, bright red bloody hematochezia with blood clots then black watery diarrhea. CTAP showed evidence of epiploic appendagitis and a 10 mm calcified stone in the region of the porta hepatis, potentially in the cystic duct or common duct without intra or extrahepatic biliary duct dilatation. This is unchanged in the interval since the prior study.   Discussed the use of AI scribe software for clinical note transcription with the patient, who gave verbal consent to proceed.  History of Present Illness      Latest Ref Rng & Units 12/08/2023    8:43 AM 09/04/2023   12:00 AM 10/02/2021    8:39 AM  CBC  WBC 4.0 - 10.5 K/uL 4.8  5.8     5.3   Hemoglobin 13.0 - 17.0 g/dL 83.8  80.9     84.6   Hematocrit 39.0 - 52.0 % 50.1  59     46.7   Platelets 150 - 400 K/uL 175  204     222.0      This result is from an external source.       Latest Ref Rng & Units 12/08/2023    8:43 AM 09/04/2023   12:00 AM 10/02/2021    8:39 AM  CMP  Glucose 70 - 99 mg/dL 88   82   BUN 8 - 23 mg/dL 14  18     23    Creatinine 0.61 - 1.24 mg/dL 8.88  1.3     8.51   Sodium 135 - 145 mmol/L 138  142     139   Potassium 3.5 - 5.1 mmol/L 4.1  4.5     4.4   Chloride 98 - 111 mmol/L 105  106     105   CO2 22 - 32 mmol/L 22  20     26    Calcium  8.9 - 10.3 mg/dL 9.6  89.9     9.6   Total Protein 6.5 - 8.1 g/dL 7.7   6.9   Total Bilirubin 0.0 - 1.2 mg/dL 0.4   0.5   Alkaline Phos 38 - 126 U/L 90  62     71   AST 15 - 41 U/L 52  20     16   ALT 0 - 44 U/L 124  26     34      This result is from an external source.    CTAP with contrast 12/08/2023: FINDINGS: Lower chest: Subsegmental atelectasis noted right lung base.   Hepatobiliary: No  suspicious focal abnormality within the liver parenchyma. Gallbladder is surgically absent. 10 mm calcified stone again identified in the region of the porta hepatis, potentially in the cystic duct or common duct without intra or extrahepatic biliary duct dilatation.   Pancreas: No focal mass lesion. No dilatation of the main duct. No intraparenchymal cyst. No peripancreatic edema.   Spleen: No splenomegaly. No suspicious focal mass lesion.   Adrenals/Urinary Tract: No adrenal nodule or mass. Bilateral simple renal cysts again noted. Tiny well-defined homogeneous low-density lesions in both kidneys are too small to characterize but are statistically most likely benign and probably cysts. No followup imaging is recommended. No overtly suspicious renal mass lesion.  No hydroureteronephrosis. Bladder is decompressed.   Stomach/Bowel: Stomach is unremarkable. No gastric wall thickening. No evidence of outlet obstruction. Duodenum is normally positioned as is the ligament of Treitz. No small bowel wall thickening. No small bowel dilatation. The terminal ileum is normal. The appendix is normal. Focal ring-like area of pericolonic edema/inflammation is associated with the proximal descending colon (axial 36/2 and coronal 64/5). A second similar area seen in the distal descending colon on axial 59/2 and coronal 71/5. colon otherwise unremarkable in largely free of diverticular disease.   Vascular/Lymphatic: No abdominal aortic aneurysm. No abdominal lymphadenopathy No pelvic sidewall lymphadenopathy.   Reproductive: Prostate gland is enlarged.   Other: No intraperitoneal free fluid.   Musculoskeletal: No worrisome lytic or sclerotic osseous abnormality.   IMPRESSION: 1. Focal ring-like area of pericolonic edema/inflammation is associated with the proximal descending colon and a second similar area seen in the distal descending colon. Imaging features are most suggestive of epiploic  appendagitis although multifocal involvement is somewhat unusual. No appreciable diverticular disease in these regions on previous imaging to suggest multifocal diverticulitis as an etiology. No colonic wall thickening to suggest colitis. Given the multifocal involvement, GI follow-up likely warranted 2. 10 mm calcified stone in the region of the porta hepatis, potentially in the cystic duct or common duct without intra or extrahepatic biliary duct dilatation. This is unchanged in the interval since the prior study. 3. Prostatomegaly.     Colonoscopy 09/30/2022: - Two 3 to 5 mm polyps in the cecum, removed with a cold snare. Resected and retrieved. - One 5 mm polyp in the ascending colon, removed with a cold snare. Resected and retrieved. - Two 4 to 5 mm polyps in the sigmoid colon, removed with a cold snare. Resected and retrieved. - Internal hemorrhoids. - 3 year recall colonoscopy  1. Surgical [P], colon, cecum, ascending, polyp (3) :       SESSILE SERRATED POLYP (X3) WITHOUT CYTOLOGIC DYSPLASIA.        2. Surgical [P], colon, sigmoid, polyp (2) :       TUBULAR ADENOMA (X2) WITHOUT HIGH GRADE DYSPLASIA.   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  09/2022 adenoma x 3   CYSTOSCOPY WITH STENT PLACEMENT Left 05/29/2015   Procedure: CYSTOSCOPY WITH STENT PLACEMENT, RETROGRADE;  Surgeon: Belvie LITTIE Clara, MD;  Location: [facility];  Service: Urology;  Laterality: Left;   CYSTOSCOPY/RETROGRADE/URETEROSCOPY/STONE EXTRACTION WITH BASKET Left 06/15/2015   Procedure: CYSTOSCOPY/RETROGRADE/URETEROSCOPY/STONE EXTRACTION WITH BASKET WITH STENT EXCHANGE;  Surgeon:  Belvie LITTIE Clara, MD;  Location: [facility];  Service: Urology;  Laterality: Left;   ETT     11/03/21 (Dr. Bettyann ischemia   HOLMIUM LASER APPLICATION Left 06/15/2015   Procedure: HOLMIUM LASER APPLICATION;  Surgeon: Belvie LITTIE Clara, MD;  Location: [facility];  Service: Urology;  Laterality: Left;   HYDROCELE EXCISION Left 04/10/2020   Procedure: HYDROCELECTOMY ADULT;  Surgeon: Matilda Senior, MD;  Location: [facility];  Service: Urology;  Laterality: Left;   KNEE SURGERY Right 2002   arthroscopic   SCROTAL EXPLORATION Left 04/10/2020   benign L epididymal mass.  Procedure: INGUINAL EXPLORATION AND EXCISION OF EPIDIDYMAL MASS;  Surgeon: Matilda Senior, MD;  Location: [facility];  Service: Urology;  Laterality: Left;  1 HR     Current Medications, Allergies, Past Medical History, Past Surgical History, Family History and Social History were reviewed in [employer] record.   Review of Systems:   Constitutional: Negative for fever, sweats, chills or weight loss.  Respiratory: Negative for shortness of breath.   Cardiovascular: Negative for chest pain, palpitations and leg swelling.  Gastrointestinal: See HPI.  Musculoskeletal: Negative for back pain or muscle aches.  Neurological: Negative for dizziness, headaches or paresthesias.     Physical Exam: There were no vitals taken for this visit. General: in no acute distress. Head: Normocephalic and atraumatic. Eyes: No scleral icterus. Conjunctiva pink . Ears: Normal auditory acuity. Mouth: Dentition intact. No ulcers or lesions.  Lungs: Clear throughout to auscultation. Heart: Regular rate and rhythm, no murmur. Abdomen: Soft, nontender and nondistended. No masses or hepatomegaly. Normal bowel sounds x 4 quadrants.  Rectal: *** Musculoskeletal: Symmetrical with no gross deformities. Extremities: No edema. Neurological: Alert oriented x 4. No focal deficits.  Psychological: Alert and cooperative. Normal mood and affect  Assessment and Recommendations:  Elevated LFTs  "

## 2024-01-26 ENCOUNTER — Ambulatory Visit: Admitting: Nurse Practitioner

## 2024-01-26 ENCOUNTER — Encounter: Payer: Self-pay | Admitting: Nurse Practitioner

## 2024-01-26 ENCOUNTER — Ambulatory Visit: Payer: Self-pay | Admitting: Nurse Practitioner

## 2024-01-26 ENCOUNTER — Other Ambulatory Visit

## 2024-01-26 VITALS — BP 130/80 | HR 68 | Ht 71.25 in | Wt 223.2 lb

## 2024-01-26 DIAGNOSIS — R197 Diarrhea, unspecified: Secondary | ICD-10-CM

## 2024-01-26 DIAGNOSIS — R7989 Other specified abnormal findings of blood chemistry: Secondary | ICD-10-CM | POA: Diagnosis not present

## 2024-01-26 DIAGNOSIS — Z860101 Personal history of adenomatous and serrated colon polyps: Secondary | ICD-10-CM | POA: Diagnosis not present

## 2024-01-26 LAB — COMPREHENSIVE METABOLIC PANEL WITH GFR
ALT: 15 U/L (ref 3–53)
AST: 13 U/L (ref 5–37)
Albumin: 4.6 g/dL (ref 3.5–5.2)
Alkaline Phosphatase: 45 U/L (ref 39–117)
BUN: 16 mg/dL (ref 6–23)
CO2: 24 meq/L (ref 19–32)
Calcium: 9.4 mg/dL (ref 8.4–10.5)
Chloride: 108 meq/L (ref 96–112)
Creatinine, Ser: 1.33 mg/dL (ref 0.40–1.50)
GFR: 57.19 mL/min — ABNORMAL LOW
Glucose, Bld: 85 mg/dL (ref 70–99)
Potassium: 4 meq/L (ref 3.5–5.1)
Sodium: 141 meq/L (ref 135–145)
Total Bilirubin: 0.3 mg/dL (ref 0.2–1.2)
Total Protein: 7.4 g/dL (ref 6.0–8.3)

## 2024-01-26 LAB — SEDIMENTATION RATE: Sed Rate: 15 mm/h (ref 0–20)

## 2024-01-26 LAB — CBC WITH DIFFERENTIAL/PLATELET
Basophils Absolute: 0 K/uL (ref 0.0–0.1)
Basophils Relative: 0.7 % (ref 0.0–3.0)
Eosinophils Absolute: 0.3 K/uL (ref 0.0–0.7)
Eosinophils Relative: 5.5 % — ABNORMAL HIGH (ref 0.0–5.0)
HCT: 49 % (ref 39.0–52.0)
Hemoglobin: 16 g/dL (ref 13.0–17.0)
Lymphocytes Relative: 24.6 % (ref 12.0–46.0)
Lymphs Abs: 1.3 K/uL (ref 0.7–4.0)
MCHC: 32.6 g/dL (ref 30.0–36.0)
MCV: 90.9 fl (ref 78.0–100.0)
Monocytes Absolute: 0.5 K/uL (ref 0.1–1.0)
Monocytes Relative: 9.1 % (ref 3.0–12.0)
Neutro Abs: 3.1 K/uL (ref 1.4–7.7)
Neutrophils Relative %: 60.1 % (ref 43.0–77.0)
Platelets: 189 K/uL (ref 150.0–400.0)
RBC: 5.39 Mil/uL (ref 4.22–5.81)
RDW: 13.6 % (ref 11.5–15.5)
WBC: 5.2 K/uL (ref 4.0–10.5)

## 2024-01-26 LAB — HIGH SENSITIVITY CRP: CRP, High Sensitivity: 2.32 mg/L (ref 0.200–5.000)

## 2024-01-26 NOTE — Patient Instructions (Signed)
 Your provider has requested that you go to the basement level for lab work before leaving today. Press B on the elevator. The lab is located at the first door on the left as you exit the elevator.  Please contact our office if symptoms recur.  _______________________________________________________  If your blood pressure at your visit was 140/90 or greater, please contact your primary care physician to follow up on this.  _______________________________________________________  If you are age 83 or older, your body mass index should be between 23-30. Your Body mass index is 30.92 kg/m. If this is out of the aforementioned range listed, please consider follow up with your Primary Care Provider.  If you are age 22 or younger, your body mass index should be between 19-25. Your Body mass index is 30.92 kg/m. If this is out of the aformentioned range listed, please consider follow up with your Primary Care Provider.   ________________________________________________________  The [facility] providers would like to encourage you to use MYCHART to communicate with providers for non-urgent requests or questions.  Due to long hold times on the telephone, sending your provider a message by [facility] may be a faster and more efficient way to get a response.  Please allow 48 business hours for a response.  Please remember that this is for non-urgent requests.  _______________________________________________________  [facility] is using a team-based approach to care.  Your team is made up of your doctor and two to three APPS. Our APPS (Nurse Practitioners and Physician Assistants) work with your physician to ensure care continuity for you. They are fully qualified to address your health concerns and develop a treatment plan. They communicate directly with your gastroenterologist to care for you. Seeing the Advanced [facility] Practitioners on your physician's team can help you by facilitating  care more promptly, often allowing for earlier appointments, access to diagnostic testing, procedures, and other specialty referrals.

## 2024-01-28 NOTE — Progress Notes (Signed)
 Addendum: Reviewed and agree with assessment and management plan. Epiploic appendagitis by CT in 2 locations is atypical and this should not cause hematochezia. 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  Liver enzymes have returned to normal.  No biliary ductal dilation and thus no ERCP recommended at present   Hewitt Garner, Gordy HERO, MD

## 2024-01-29 ENCOUNTER — Telehealth: Payer: Self-pay | Admitting: *Deleted

## 2024-01-29 DIAGNOSIS — R197 Diarrhea, unspecified: Secondary | ICD-10-CM

## 2024-01-29 DIAGNOSIS — K921 Melena: Secondary | ICD-10-CM

## 2024-01-29 MED ORDER — NA SULFATE-K SULFATE-MG SULF 17.5-3.13-1.6 GM/177ML PO SOLN: ORAL | 0 refills | Status: AC

## 2024-01-29 NOTE — Progress Notes (Signed)
 Linda/Dottie: Please contact patient and let him know I consulted with Dr. Albertus and he recommended scheduling the patient for a diagnostic colonoscopy at this time due to his episodes of hematochezia as noted in his office visit. I discussed a diagnostic colonoscopy with the patient during his office visit and he knew I would review with Dr. Albertus before ordering.   Pls schedule the patient for a diagnostic colonoscopy with Dr. Albertus in [facility]. THX.

## 2024-01-29 NOTE — Progress Notes (Signed)
 See phone note dated 01/29/24.

## 2024-01-29 NOTE — Telephone Encounter (Signed)
 I have spoken to patient to advise that Dr Albertus does recommend diagnostic colonoscopy for further evaluation of hematochezia. Patient is in agreement with this recommendation. He has scheduled colonoscopy in [facility] on 02/24/24.  Patient has been advised of time/date/location for upcoming procedure and has been given generalized verbal prep instructions. Discussed that a care partner 18 years or older should bring him, stay for the procedure and drive home due to sedation. Written instructions have been made available to the patient for additional review via mychart.

## 2024-02-17 ENCOUNTER — Encounter: Payer: Self-pay | Admitting: Internal Medicine

## 2024-02-24 ENCOUNTER — Encounter: Admitting: Internal Medicine

## 2024-02-26 ENCOUNTER — Ambulatory Visit: Admitting: Family Medicine

## 2024-03-23 ENCOUNTER — Ambulatory Visit: Admitting: Family Medicine
